# Patient Record
Sex: Male | Born: 1950 | Race: White | Hispanic: No | Marital: Married | Smoking: Never smoker
Health system: Southern US, Community
[De-identification: ages and names within clinical notes are randomized; demographics above are authoritative.]

## PROBLEM LIST (undated history)

## (undated) DIAGNOSIS — K219 Gastro-esophageal reflux disease without esophagitis: Secondary | ICD-10-CM

## (undated) DIAGNOSIS — Z9289 Personal history of other medical treatment: Secondary | ICD-10-CM

## (undated) DIAGNOSIS — M1712 Unilateral primary osteoarthritis, left knee: Secondary | ICD-10-CM

## (undated) DIAGNOSIS — E785 Hyperlipidemia, unspecified: Secondary | ICD-10-CM

## (undated) DIAGNOSIS — M5136 Other intervertebral disc degeneration, lumbar region: Secondary | ICD-10-CM

## (undated) DIAGNOSIS — S83207A Unspecified tear of unspecified meniscus, current injury, left knee, initial encounter: Secondary | ICD-10-CM

## (undated) DIAGNOSIS — Z8619 Personal history of other infectious and parasitic diseases: Secondary | ICD-10-CM

## (undated) DIAGNOSIS — E119 Type 2 diabetes mellitus without complications: Secondary | ICD-10-CM

## (undated) DIAGNOSIS — M51369 Other intervertebral disc degeneration, lumbar region without mention of lumbar back pain or lower extremity pain: Secondary | ICD-10-CM

## (undated) DIAGNOSIS — M75102 Unspecified rotator cuff tear or rupture of left shoulder, not specified as traumatic: Secondary | ICD-10-CM

## (undated) HISTORY — PX: THUMB AMPUTATION: SHX804

## (undated) HISTORY — PX: COLONOSCOPY: SHX174

## (undated) HISTORY — DX: Type 2 diabetes mellitus without complications: E11.9

## (undated) HISTORY — PX: SHOULDER ARTHROSCOPY: SHX128

## (undated) HISTORY — PX: SPINE SURGERY: SHX786

## (undated) HISTORY — PX: JOINT REPLACEMENT: SHX530

## (undated) HISTORY — DX: Unspecified rotator cuff tear or rupture of left shoulder, not specified as traumatic: M75.102

## (undated) HISTORY — PX: FOOT SURGERY: SHX648

## (undated) HISTORY — DX: Other intervertebral disc degeneration, lumbar region without mention of lumbar back pain or lower extremity pain: M51.369

## (undated) HISTORY — PX: LAMINOTOMY: SHX998

## (undated) HISTORY — DX: Hyperlipidemia, unspecified: E78.5

## (undated) HISTORY — PX: KNEE ARTHROSCOPY: SUR90

## (undated) HISTORY — DX: Other intervertebral disc degeneration, lumbar region: M51.36

## (undated) HISTORY — DX: Unspecified tear of unspecified meniscus, current injury, left knee, initial encounter: S83.207A

## (undated) HISTORY — PX: CERVICAL FUSION: SHX112

---

## 2002-03-16 ENCOUNTER — Encounter: Payer: Self-pay | Admitting: Family Medicine

## 2002-03-16 ENCOUNTER — Encounter: Admission: RE | Admit: 2002-03-16 | Discharge: 2002-03-16 | Payer: Self-pay | Admitting: Family Medicine

## 2004-08-03 ENCOUNTER — Encounter: Admission: RE | Admit: 2004-08-03 | Discharge: 2004-08-03 | Payer: Self-pay | Admitting: Family Medicine

## 2013-09-21 ENCOUNTER — Other Ambulatory Visit: Payer: BC Managed Care – PPO

## 2013-09-21 DIAGNOSIS — Z125 Encounter for screening for malignant neoplasm of prostate: Secondary | ICD-10-CM

## 2013-09-21 DIAGNOSIS — I10 Essential (primary) hypertension: Secondary | ICD-10-CM

## 2013-09-21 DIAGNOSIS — R739 Hyperglycemia, unspecified: Secondary | ICD-10-CM

## 2013-09-21 DIAGNOSIS — E785 Hyperlipidemia, unspecified: Secondary | ICD-10-CM

## 2013-09-21 DIAGNOSIS — Z0000 Encounter for general adult medical examination without abnormal findings: Secondary | ICD-10-CM

## 2013-09-21 DIAGNOSIS — Z79899 Other long term (current) drug therapy: Secondary | ICD-10-CM

## 2013-09-21 LAB — CBC WITH DIFFERENTIAL/PLATELET
Basophils Absolute: 0 10*3/uL (ref 0.0–0.1)
Basophils Relative: 0 % (ref 0–1)
EOS ABS: 0.2 10*3/uL (ref 0.0–0.7)
Eosinophils Relative: 3 % (ref 0–5)
HCT: 38.6 % — ABNORMAL LOW (ref 39.0–52.0)
Hemoglobin: 13 g/dL (ref 13.0–17.0)
LYMPHS ABS: 1.9 10*3/uL (ref 0.7–4.0)
LYMPHS PCT: 32 % (ref 12–46)
MCH: 30.4 pg (ref 26.0–34.0)
MCHC: 33.7 g/dL (ref 30.0–36.0)
MCV: 90.4 fL (ref 78.0–100.0)
Monocytes Absolute: 0.5 10*3/uL (ref 0.1–1.0)
Monocytes Relative: 9 % (ref 3–12)
NEUTROS ABS: 3.3 10*3/uL (ref 1.7–7.7)
NEUTROS PCT: 56 % (ref 43–77)
PLATELETS: 229 10*3/uL (ref 150–400)
RBC: 4.27 MIL/uL (ref 4.22–5.81)
RDW: 14.1 % (ref 11.5–15.5)
WBC: 5.9 10*3/uL (ref 4.0–10.5)

## 2013-09-21 LAB — LIPID PANEL
CHOL/HDL RATIO: 2.7 ratio
Cholesterol: 167 mg/dL (ref 0–200)
HDL: 62 mg/dL (ref >39)
LDL CALC: 93 mg/dL (ref 0–99)
Triglycerides: 59 mg/dL (ref <150)
VLDL: 12 mg/dL (ref 0–40)

## 2013-09-21 LAB — HEMOGLOBIN A1C
HEMOGLOBIN A1C: 6.4 % — AB (ref <5.7)
Mean Plasma Glucose: 137 mg/dL — ABNORMAL HIGH (ref <117)

## 2013-09-21 LAB — PSA: PSA: 0.61 ng/mL (ref <=4.00)

## 2013-09-21 LAB — TSH: TSH: 1.273 u[IU]/mL (ref 0.350–4.500)

## 2013-09-22 LAB — VITAMIN D 25 HYDROXY (VIT D DEFICIENCY, FRACTURES): Vit D, 25-Hydroxy: 42 ng/mL (ref 30–89)

## 2013-09-25 ENCOUNTER — Encounter: Payer: Self-pay | Admitting: Family Medicine

## 2013-09-25 ENCOUNTER — Ambulatory Visit (INDEPENDENT_AMBULATORY_CARE_PROVIDER_SITE_OTHER): Payer: BC Managed Care – PPO | Admitting: Family Medicine

## 2013-09-25 VITALS — BP 120/82 | HR 78 | Temp 97.0°F | Resp 18 | Ht 70.0 in | Wt 172.0 lb

## 2013-09-25 DIAGNOSIS — Z23 Encounter for immunization: Secondary | ICD-10-CM

## 2013-09-25 DIAGNOSIS — Z0000 Encounter for general adult medical examination without abnormal findings: Secondary | ICD-10-CM

## 2013-09-25 DIAGNOSIS — R7303 Prediabetes: Secondary | ICD-10-CM | POA: Insufficient documentation

## 2013-09-25 DIAGNOSIS — M5412 Radiculopathy, cervical region: Secondary | ICD-10-CM

## 2013-09-25 DIAGNOSIS — Z79899 Other long term (current) drug therapy: Secondary | ICD-10-CM

## 2013-09-25 DIAGNOSIS — M5010 Cervical disc disorder with radiculopathy, unspecified cervical region: Secondary | ICD-10-CM

## 2013-09-25 MED ORDER — ATORVASTATIN CALCIUM 20 MG PO TABS: 20.0000 mg | ORAL_TABLET | Freq: Every day | ORAL | Status: DC
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
## 2013-09-25 NOTE — Addendum Note (Signed)
Addended by: Shary Decamp B on: 09/25/2013 08:57 AM   Modules accepted: Orders

## 2013-09-28 ENCOUNTER — Other Ambulatory Visit: Payer: Self-pay | Admitting: Family Medicine

## 2013-09-28 ENCOUNTER — Ambulatory Visit
Admission: RE | Admit: 2013-09-28 | Discharge: 2013-09-28 | Disposition: A | Discharge status: Home / Self Care | Payer: BC Managed Care – PPO | Source: Ambulatory Visit | Attending: Family Medicine | Admitting: Family Medicine

## 2013-09-28 DIAGNOSIS — M5010 Cervical disc disorder with radiculopathy, unspecified cervical region: Secondary | ICD-10-CM

## 2013-09-28 DIAGNOSIS — G9520 Unspecified cord compression: Secondary | ICD-10-CM

## 2013-11-05 ENCOUNTER — Telehealth: Payer: Self-pay | Admitting: *Deleted

## 2013-11-05 MED ORDER — SCOPOLAMINE 1 MG/3DAYS TD PT72: 1.0000 | MEDICATED_PATCH | TRANSDERMAL | Status: DC

## 2013-11-05 NOTE — Telephone Encounter (Signed)
OK to fill

## 2013-11-05 NOTE — Telephone Encounter (Signed)
Med sent to pharm and pt aware per vm

## 2013-11-05 NOTE — Telephone Encounter (Signed)
Pt is going fishing in a week at the beach and needs patch for nausea will only be gone for a day.   Pharmacy [facility]  Call back number is [phone]

## 2013-11-05 NOTE — Telephone Encounter (Signed)
Scopolamine patch 1 patch q 72 hrs prn motion sickness.

## 2014-09-23 ENCOUNTER — Other Ambulatory Visit: Payer: Self-pay | Admitting: Family Medicine

## 2015-01-06 ENCOUNTER — Telehealth: Payer: Self-pay | Admitting: Family Medicine

## 2015-01-06 MED ORDER — ATORVASTATIN CALCIUM 20 MG PO TABS: 20.0000 mg | ORAL_TABLET | Freq: Every day | ORAL | Status: DC

## 2015-01-06 NOTE — Telephone Encounter (Signed)
Medication called/sent to requested pharmacy  

## 2015-01-06 NOTE — Telephone Encounter (Signed)
Patient is calling to say that he needs to switch pharmacies because his pharmacy is not going to be able to cover the lipitor anymore, needs a refill on this and to go to the :   [facility] if any questions for him call [phone]

## 2015-01-14 ENCOUNTER — Other Ambulatory Visit: Payer: Medicare Other

## 2015-01-14 DIAGNOSIS — Z79899 Other long term (current) drug therapy: Secondary | ICD-10-CM

## 2015-01-14 DIAGNOSIS — E785 Hyperlipidemia, unspecified: Secondary | ICD-10-CM

## 2015-01-14 DIAGNOSIS — R7303 Prediabetes: Secondary | ICD-10-CM

## 2015-01-14 DIAGNOSIS — Z0000 Encounter for general adult medical examination without abnormal findings: Secondary | ICD-10-CM

## 2015-01-14 LAB — HEMOGLOBIN A1C
Hgb A1c MFr Bld: 7 % — ABNORMAL HIGH (ref <5.7)
Mean Plasma Glucose: 154 mg/dL — ABNORMAL HIGH (ref <117)

## 2015-01-14 LAB — CBC WITH DIFFERENTIAL/PLATELET
Basophils Absolute: 0 10*3/uL (ref 0.0–0.1)
Basophils Relative: 0 % (ref 0–1)
Eosinophils Absolute: 0.2 10*3/uL (ref 0.0–0.7)
Eosinophils Relative: 2 % (ref 0–5)
HCT: 41.9 % (ref 39.0–52.0)
Hemoglobin: 14.1 g/dL (ref 13.0–17.0)
Lymphocytes Relative: 11 % — ABNORMAL LOW (ref 12–46)
Lymphs Abs: 1.3 10*3/uL (ref 0.7–4.0)
MCH: 31.3 pg (ref 26.0–34.0)
MCHC: 33.7 g/dL (ref 30.0–36.0)
MCV: 92.9 fL (ref 78.0–100.0)
MPV: 10.9 fL (ref 8.6–12.4)
Monocytes Absolute: 1.1 10*3/uL — ABNORMAL HIGH (ref 0.1–1.0)
Monocytes Relative: 10 % (ref 3–12)
NEUTROS ABS: 8.8 10*3/uL — AB (ref 1.7–7.7)
Neutrophils Relative %: 77 % (ref 43–77)
PLATELETS: 209 10*3/uL (ref 150–400)
RBC: 4.51 MIL/uL (ref 4.22–5.81)
RDW: 14.2 % (ref 11.5–15.5)
WBC: 11.4 10*3/uL — AB (ref 4.0–10.5)

## 2015-01-14 LAB — COMPLETE METABOLIC PANEL WITH GFR
ALT: 19 U/L (ref 0–53)
AST: 23 U/L (ref 0–37)
Albumin: 4.3 g/dL (ref 3.5–5.2)
Alkaline Phosphatase: 70 U/L (ref 39–117)
BILIRUBIN TOTAL: 0.4 mg/dL (ref 0.2–1.2)
BUN: 18 mg/dL (ref 6–23)
CO2: 27 meq/L (ref 19–32)
CREATININE: 0.94 mg/dL (ref 0.50–1.35)
Calcium: 9.8 mg/dL (ref 8.4–10.5)
Chloride: 102 mEq/L (ref 96–112)
GFR, Est Non African American: 85 mL/min
GLUCOSE: 105 mg/dL — AB (ref 70–99)
POTASSIUM: 4.2 meq/L (ref 3.5–5.3)
SODIUM: 140 meq/L (ref 135–145)
Total Protein: 6.7 g/dL (ref 6.0–8.3)

## 2015-01-14 LAB — LIPID PANEL
Cholesterol: 173 mg/dL (ref 0–200)
HDL: 63 mg/dL (ref >=40)
LDL Cholesterol: 92 mg/dL (ref 0–99)
Total CHOL/HDL Ratio: 2.7 Ratio
Triglycerides: 92 mg/dL (ref <150)
VLDL: 18 mg/dL (ref 0–40)

## 2015-01-14 LAB — TSH: TSH: 1.48 u[IU]/mL (ref 0.350–4.500)

## 2015-01-31 ENCOUNTER — Ambulatory Visit (INDEPENDENT_AMBULATORY_CARE_PROVIDER_SITE_OTHER): Payer: Medicare Other | Admitting: Family Medicine

## 2015-01-31 ENCOUNTER — Encounter: Payer: Self-pay | Admitting: Family Medicine

## 2015-01-31 VITALS — BP 136/76 | HR 72 | Temp 97.8°F | Resp 18 | Ht 70.0 in | Wt 177.0 lb

## 2015-01-31 DIAGNOSIS — Z0000 Encounter for general adult medical examination without abnormal findings: Secondary | ICD-10-CM

## 2015-01-31 DIAGNOSIS — Z125 Encounter for screening for malignant neoplasm of prostate: Secondary | ICD-10-CM | POA: Diagnosis not present

## 2015-01-31 DIAGNOSIS — Z23 Encounter for immunization: Secondary | ICD-10-CM | POA: Diagnosis not present

## 2015-01-31 NOTE — Addendum Note (Signed)
Addended by: Shary Decamp B on: 01/31/2015 08:35 AM   Modules accepted: Orders

## 2015-01-31 NOTE — Addendum Note (Signed)
Addended by: Shary Decamp B on: 01/31/2015 09:18 AM   Modules accepted: Orders

## 2015-01-31 NOTE — Progress Notes (Signed)
Subjective:    Patient ID: Todd Lawrence, male    DOB: [birth year], 64 y.o.   MRN: [id number]  HPI  Patient is a 64 yo Wm here for CPE.  He has no specific concerns.  Colonoscopy was performed less than 10 years ago.  Patient is due for DRE and PSA.  He is also due for pneumovax 23.  Has had tetanus and zostavax.  Most recent lab work is below: Lab on 01/14/2015  Component Date Value Ref Range Status  . Sodium 01/14/2015 140  135 - 145 mEq/L Final  . Potassium 01/14/2015 4.2  3.5 - 5.3 mEq/L Final  . Chloride 01/14/2015 102  96 - 112 mEq/L Final  . CO2 01/14/2015 27  19 - 32 mEq/L Final  . Glucose, Bld 01/14/2015 105* 70 - 99 mg/dL Final  . BUN 01/14/2015 18  6 - 23 mg/dL Final  . Creat 01/14/2015 0.94  0.50 - 1.35 mg/dL Final  . Total Bilirubin 01/14/2015 0.4  0.2 - 1.2 mg/dL Final  . Alkaline Phosphatase 01/14/2015 70  39 - 117 U/L Final  . AST 01/14/2015 23  0 - 37 U/L Final  . ALT 01/14/2015 19  0 - 53 U/L Final  . Total Protein 01/14/2015 6.7  6.0 - 8.3 g/dL Final  . Albumin 01/14/2015 4.3  3.5 - 5.2 g/dL Final  . Calcium 01/14/2015 9.8  8.4 - 10.5 mg/dL Final  . GFR, Est African American 01/14/2015 >89   Final  . GFR, Est Non African American 01/14/2015 85   Final   Comment:   The estimated GFR is a calculation valid for adults (>=41 years old) that uses the CKD-EPI algorithm to adjust for age and sex. It is   not to be used for children, pregnant women, hospitalized patients,    patients on dialysis, or with rapidly changing kidney function. According to the NKDEP, eGFR >89 is normal, 60-89 shows mild impairment, 30-59 shows moderate impairment, 15-29 shows severe impairment and <15 is ESRD.     . TSH 01/14/2015 1.480  0.350 - 4.500 uIU/mL Final  . Cholesterol 01/14/2015 173  0 - 200 mg/dL Final   Comment: ATP III Classification:       < 200        mg/dL        Desirable      200 - 239     mg/dL        Borderline High      >= 240        mg/dL        High     .  Triglycerides 01/14/2015 92  <150 mg/dL Final  . HDL 01/14/2015 63  >=40 mg/dL Final  . Total CHOL/HDL Ratio 01/14/2015 2.7   Final  . VLDL 01/14/2015 18  0 - 40 mg/dL Final  . LDL Cholesterol 01/14/2015 92  0 - 99 mg/dL Final   Comment:   Total Cholesterol/HDL Ratio:CHD Risk                        Coronary Heart Disease Risk Table                                        Men       Women          1/2 Average Risk  3.4        3.3              Average Risk              5.0        4.4           2X Average Risk              9.6        7.1           3X Average Risk             23.4       11.0 Use the calculated Patient Ratio above and the CHD Risk table  to determine the patient's CHD Risk. ATP III Classification (LDL):       < 100        mg/dL         Optimal      100 - 129     mg/dL         Near or Above Optimal      130 - 159     mg/dL         Borderline High      160 - 189     mg/dL         High       > 190        mg/dL         Very High     . WBC 01/14/2015 11.4* 4.0 - 10.5 K/uL Final  . RBC 01/14/2015 4.51  4.22 - 5.81 MIL/uL Final  . Hemoglobin 01/14/2015 14.1  13.0 - 17.0 g/dL Final  . HCT 01/14/2015 41.9  39.0 - 52.0 % Final  . MCV 01/14/2015 92.9  78.0 - 100.0 fL Final  . MCH 01/14/2015 31.3  26.0 - 34.0 pg Final  . MCHC 01/14/2015 33.7  30.0 - 36.0 g/dL Final  . RDW 01/14/2015 14.2  11.5 - 15.5 % Final  . Platelets 01/14/2015 209  150 - 400 K/uL Final  . MPV 01/14/2015 10.9  8.6 - 12.4 fL Final  . Neutrophils Relative % 01/14/2015 77  43 - 77 % Final  . Neutro Abs 01/14/2015 8.8* 1.7 - 7.7 K/uL Final  . Lymphocytes Relative 01/14/2015 11* 12 - 46 % Final  . Lymphs Abs 01/14/2015 1.3  0.7 - 4.0 K/uL Final  . Monocytes Relative 01/14/2015 10  3 - 12 % Final  . Monocytes Absolute 01/14/2015 1.1* 0.1 - 1.0 K/uL Final  . Eosinophils Relative 01/14/2015 2  0 - 5 % Final  . Eosinophils Absolute 01/14/2015 0.2  0.0 - 0.7 K/uL Final  . Basophils Relative 01/14/2015 0   0 - 1 % Final  . Basophils Absolute 01/14/2015 0.0  0.0 - 0.1 K/uL Final  . Smear Review 01/14/2015 Criteria for review not met   Final  . Hgb A1c MFr Bld 01/14/2015 7.0* <5.7 % Final   Comment:                                                                        According to the ADA Clinical Practice Recommendations for 2011, when HbA1c is used as  a screening test:     >=6.5%   Diagnostic of Diabetes Mellitus            (if abnormal result is confirmed)   5.7-6.4%   Increased risk of developing Diabetes Mellitus   References:Diagnosis and Classification of Diabetes Mellitus,Diabetes OLMB,8675,44(BEEFE 1):S62-S69 and Standards of Medical Care in         Diabetes - 2011,Diabetes OFHQ,1975,88 (Suppl 1):S11-S61.     . Mean Plasma Glucose 01/14/2015 154* <117 mg/dL Final   Past Medical History  Diagnosis Date  . Hyperlipidemia   . Prediabetes    No past surgical history on file. Current Outpatient Prescriptions on File Prior to Visit  Medication Sig Dispense Refill  . aspirin 81 MG tablet Take 81 mg by mouth daily.    Marland Kitchen atorvastatin (LIPITOR) 20 MG tablet Take 1 tablet (20 mg total) by mouth daily. 90 tablet 1   No current facility-administered medications on file prior to visit.   No Known Allergies History   Social History  . Marital Status: Married    Spouse Name: N/A  . Number of Children: N/A  . Years of Education: N/A   Occupational History  . Not on file.   Social History Main Topics  . Smoking status: Never Smoker   . Smokeless tobacco: Never Used  . Alcohol Use: No  . Drug Use: No  . 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 Not on file   Social History Narrative   Family History  Problem Relation Age of Onset  . Heart disease Father   . Asthma Sister   . Arthritis Sister      Review of Systems  All other systems reviewed and are negative.      Objective:   Physical  Exam  Constitutional: He is oriented to person, place, and time. He appears well-developed and well-nourished. No distress.  HENT:  Head: Normocephalic and atraumatic.  Right Ear: External ear normal.  Left Ear: External ear normal.  Nose: Nose normal.  Mouth/Throat: Oropharynx is clear and moist. No oropharyngeal exudate.  Eyes: Conjunctivae and EOM are normal. Pupils are equal, round, and reactive to light. Right eye exhibits no discharge. Left eye exhibits no discharge. No scleral icterus.  Neck: Normal range of motion. Neck supple. No JVD present. No thyromegaly present.  Cardiovascular: Normal rate, regular rhythm, normal heart sounds and intact distal pulses.  Exam reveals no gallop and no friction rub.   No murmur heard. Pulmonary/Chest: Effort normal and breath sounds normal. No respiratory distress. He has no wheezes. He has no rales. He exhibits no tenderness.  Abdominal: Soft. Bowel sounds are normal. He exhibits no distension and no mass. There is no tenderness. There is no rebound and no guarding.  Genitourinary: Rectum normal, prostate normal and penis normal.  Musculoskeletal: Normal range of motion. He exhibits no edema or tenderness.  Lymphadenopathy:    He has no cervical adenopathy.  Neurological: He is alert and oriented to person, place, and time. He has normal reflexes. He displays normal reflexes. No cranial nerve deficit. He exhibits normal muscle tone. Coordination normal.  Skin: Skin is warm. No rash noted. He is not diaphoretic. No erythema. No pallor.  Psychiatric: He has a normal mood and affect. His behavior is normal. Judgment and thought content normal.  Vitals reviewed.         Assessment & Plan:  Routine  general medical examination at a health care facility  Patient's exam was normal.  Had a long discussion regarding his diabetes.  He would like to try aggressive lifestyle changes and recheck his sugar in 6 months rather than metformin.  Received  pneumovax today.  Will check PSA.  Remainder of preventative care is up to date.  Regular anticipatory guidance is provided. I recommended less than 45 g of carbs per meal. Patient has been relatively inactive due to recent neck surgery and knee surgery. He believes this is causing his sugars to rise. 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 Patient recently had an eye exam. Diabetic foot exam is normal

## 2015-02-01 LAB — PSA: PSA: 0.63 ng/mL (ref <=4.00)

## 2015-07-04 ENCOUNTER — Other Ambulatory Visit: Payer: Self-pay | Admitting: Family Medicine

## 2015-08-09 ENCOUNTER — Emergency Department (HOSPITAL_COMMUNITY): Payer: Medicare Other

## 2015-08-09 ENCOUNTER — Encounter (HOSPITAL_COMMUNITY): Payer: Self-pay | Admitting: Emergency Medicine

## 2015-08-09 ENCOUNTER — Emergency Department (HOSPITAL_COMMUNITY)
Admission: EM | Admit: 2015-08-09 | Discharge: 2015-08-09 | Disposition: A | Discharge status: Other Acute Inpatient Hospital | Payer: Medicare Other | Attending: Emergency Medicine | Admitting: Emergency Medicine

## 2015-08-09 DIAGNOSIS — Y9289 Other specified places as the place of occurrence of the external cause: Secondary | ICD-10-CM | POA: Insufficient documentation

## 2015-08-09 DIAGNOSIS — S68123A Partial traumatic metacarpophalangeal amputation of left middle finger, initial encounter: Secondary | ICD-10-CM | POA: Insufficient documentation

## 2015-08-09 DIAGNOSIS — Y9389 Activity, other specified: Secondary | ICD-10-CM | POA: Insufficient documentation

## 2015-08-09 DIAGNOSIS — S61412A Laceration without foreign body of left hand, initial encounter: Secondary | ICD-10-CM | POA: Diagnosis present

## 2015-08-09 DIAGNOSIS — S68012A Complete traumatic metacarpophalangeal amputation of left thumb, initial encounter: Secondary | ICD-10-CM | POA: Insufficient documentation

## 2015-08-09 DIAGNOSIS — W312XXA Contact with powered woodworking and forming machines, initial encounter: Secondary | ICD-10-CM | POA: Insufficient documentation

## 2015-08-09 DIAGNOSIS — Z79899 Other long term (current) drug therapy: Secondary | ICD-10-CM | POA: Insufficient documentation

## 2015-08-09 DIAGNOSIS — Z7982 Long term (current) use of aspirin: Secondary | ICD-10-CM | POA: Insufficient documentation

## 2015-08-09 DIAGNOSIS — IMO0002 Reserved for concepts with insufficient information to code with codable children: Secondary | ICD-10-CM

## 2015-08-09 DIAGNOSIS — E785 Hyperlipidemia, unspecified: Secondary | ICD-10-CM | POA: Insufficient documentation

## 2015-08-09 DIAGNOSIS — Z23 Encounter for immunization: Secondary | ICD-10-CM | POA: Insufficient documentation

## 2015-08-09 DIAGNOSIS — Y998 Other external cause status: Secondary | ICD-10-CM | POA: Insufficient documentation

## 2015-08-09 DIAGNOSIS — S68121A Partial traumatic metacarpophalangeal amputation of left index finger, initial encounter: Secondary | ICD-10-CM | POA: Diagnosis not present

## 2015-08-09 DIAGNOSIS — E119 Type 2 diabetes mellitus without complications: Secondary | ICD-10-CM | POA: Diagnosis not present

## 2015-08-09 LAB — CBC
HCT: 37.1 % — ABNORMAL LOW (ref 39.0–52.0)
HEMOGLOBIN: 12.2 g/dL — AB (ref 13.0–17.0)
MCH: 30.7 pg (ref 26.0–34.0)
MCHC: 32.9 g/dL (ref 30.0–36.0)
MCV: 93.5 fL (ref 78.0–100.0)
Platelets: 204 10*3/uL (ref 150–400)
RBC: 3.97 MIL/uL — ABNORMAL LOW (ref 4.22–5.81)
RDW: 12.8 % (ref 11.5–15.5)
WBC: 7.3 10*3/uL (ref 4.0–10.5)

## 2015-08-09 LAB — BASIC METABOLIC PANEL
Anion gap: 9 (ref 5–15)
BUN: 18 mg/dL (ref 6–20)
CHLORIDE: 101 mmol/L (ref 101–111)
CO2: 25 mmol/L (ref 22–32)
CREATININE: 0.89 mg/dL (ref 0.61–1.24)
Calcium: 9 mg/dL (ref 8.9–10.3)
GFR calc Af Amer: >60 mL/min (ref >60)
GFR calc non Af Amer: >60 mL/min (ref >60)
GLUCOSE: 153 mg/dL — AB (ref 65–99)
Potassium: 4.3 mmol/L (ref 3.5–5.1)
SODIUM: 135 mmol/L (ref 135–145)

## 2015-08-09 MED ORDER — TETANUS-DIPHTH-ACELL PERTUSSIS 5-2.5-18.5 LF-MCG/0.5 IM SUSP
0.5000 mL | Freq: Once | INTRAMUSCULAR | Status: AC
  Administered 2015-08-09: 0.5 mL via INTRAMUSCULAR
  Filled 2015-08-09: qty 0.5

## 2015-08-09 MED ORDER — HYDROMORPHONE HCL 1 MG/ML IJ SOLN
1.0000 mg | Freq: Once | INTRAMUSCULAR | Status: AC
  Administered 2015-08-09: 1 mg via INTRAVENOUS
  Filled 2015-08-09: qty 1

## 2015-08-09 MED ORDER — MORPHINE SULFATE (PF) 4 MG/ML IV SOLN
6.0000 mg | Freq: Once | INTRAVENOUS | Status: AC
  Administered 2015-08-09: 6 mg via INTRAVENOUS
  Filled 2015-08-09: qty 2

## 2015-08-09 MED ORDER — SODIUM CHLORIDE 0.9 % IV BOLUS (SEPSIS)
1000.0000 mL | Freq: Once | INTRAVENOUS | Status: AC
  Administered 2015-08-09: 1000 mL via INTRAVENOUS

## 2015-08-09 MED ORDER — CEFAZOLIN SODIUM-DEXTROSE 2-3 GM-% IV SOLR
2.0000 g | Freq: Once | INTRAVENOUS | Status: AC
  Administered 2015-08-09: 2 g via INTRAVENOUS
  Filled 2015-08-09: qty 50

## 2015-08-09 NOTE — ED Notes (Signed)
Pt here with a lac to the left hand from a table saw pt states that it got the thumb pointer and middle finger , bleeding is controlled

## 2015-08-09 NOTE — ED Notes (Signed)
Patient transported to X-ray 

## 2015-08-09 NOTE — ED Notes (Signed)
Patient returned from X-ray 

## 2015-08-09 NOTE — ED Notes (Signed)
Notified [facility] for transfer to [facility]

## 2015-08-09 NOTE — Progress Notes (Signed)
Orthopedic Tech Progress Note Patient Details:  Todd Lawrence 1951-04-26 WI:[id number]  Ortho Devices Type of Ortho Device: Ace wrap, Volar splint Ortho Device/Splint Interventions: Application   [name] 08/09/2015, 6:10 PM
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
## 2015-08-09 NOTE — ED Provider Notes (Signed)
CSN: [id number]     Arrival date & time 08/09/15  1545 History   First MD Initiated Contact with Patient 08/09/15 1557     Chief Complaint  Patient presents with  . Extremity Laceration     (Consider location/radiation/quality/duration/timing/severity/associated sxs/prior Treatment) HPI Comments: 65 year old male with history of diabetes on aspirin presents for significant hand laceration and amputation of the left hand from a table saw prior to arrival. Primarily affected thumb and index finger. Patient is right-handed. No other injuries. Unsure tetanus. Pain with any range of motion patient received pain meds prior to arrival by EMS.  The history is provided by the patient.    Past Medical History  Diagnosis Date  . Hyperlipidemia   . Diabetes mellitus without complication [facility])    Past Surgical History  Procedure Laterality Date  . Foot surgery    . Cervical fusion    . Knee arthroscopy     Family History  Problem Relation Age of Onset  . Heart disease Father   . Asthma Sister   . Arthritis Sister    Social History  Substance Use Topics  . Smoking status: Never Smoker   . Smokeless tobacco: Never Used  . Alcohol Use: No    Review of Systems  Constitutional: Negative for fever and chills.  HENT: Negative for congestion.   Eyes: Negative for visual disturbance.  Respiratory: Negative for shortness of breath.   Cardiovascular: Negative for chest pain.  Gastrointestinal: Negative for vomiting and abdominal pain.  Genitourinary: Negative for dysuria and flank pain.  Musculoskeletal: Positive for joint swelling. Negative for back pain, neck pain and neck stiffness.  Skin: Positive for wound. Negative for rash.  Neurological: Negative for light-headedness and headaches.      Allergies  Review of patient's allergies indicates no known allergies.  Home Medications   Prior to Admission medications   Medication Sig Start Date End Date Taking? Authorizing Provider   aspirin 81 MG tablet Take 81 mg by mouth daily.   Yes Historical Provider, MD  atorvastatin (LIPITOR) 20 MG tablet TAKE 1 TABLET(20 MG) BY MOUTH DAILY 07/04/15  Yes [name], MD  Misc Natural Products (GLUCOSAMINE CHOND COMPLEX/MSM PO) Take 1 tablet by mouth daily.    Yes Historical Provider, MD  Multiple Vitamin (MULTIVITAMIN WITH MINERALS) TABS tablet Take 1 tablet by mouth daily.   Yes Historical Provider, MD   BP 145/93 mmHg  Pulse 96  Temp(Src) 97.8 F (36.6 C) (Oral)  Resp 14  SpO2 98% Physical Exam  Constitutional: He is oriented to person, place, and time. He appears well-developed and well-nourished.  HENT:  Head: Normocephalic and atraumatic.  Eyes: Conjunctivae are normal. Right eye exhibits no discharge. Left eye exhibits no discharge.  Neck: Normal range of motion. Neck supple. No tracheal deviation present.  Cardiovascular: Normal rate and regular rhythm.   Pulmonary/Chest: Effort normal and breath sounds normal.  Abdominal: Soft. He exhibits no distension. There is no tenderness. There is no guarding.  Musculoskeletal: He exhibits edema and tenderness.  Patient has complete amputation of left thumb hanging on with small areas skin, mild bleeding, bone exposed, patient has partial amputation of index finger and middle finger on the left hand with decreased flexion and extension, cool distal fingertips and the thumb pointer and middle finger. Patient has flexion extension grossly of the small finger and ring finger on the left side. Difficulty obtaining details due to pain and bleeding at this time. Decreased sensation distal to palpation. No  tenderness to wrist or proximal forearm.  Neurological: He is alert and oriented to person, place, and time.  Skin: Skin is warm. No rash noted.  Psychiatric: He has a normal mood and affect.  Nursing note and vitals reviewed.   ED Course  Procedures (including critical care time) Labs Review Labs Reviewed  CBC - Abnormal;  Notable for the following:    RBC 3.97 (*)    Hemoglobin 12.2 (*)    HCT 37.1 (*)    All other components within normal limits  BASIC METABOLIC PANEL - Abnormal; Notable for the following:    Glucose, Bld 153 (*)    All other components within normal limits    Imaging Review No results found. I have personally reviewed and evaluated these images and lab results as part of my medical decision-making.   EKG Interpretation None      MDM   Final diagnoses:  Thumb amputation status, left  Hand laceration, left, initial encounter   Patient presents with significant left hand laceration with unfortunate amputation of thumb and partial amputation of index and middle finger. Laceration extends all the way across palmar aspect of the left hand. Hand surgeon consult immediately. Nothing by mouth, pain meds fluids blood work. Plan for operating room. Hand surgery recommended transfer to [facility], Copy discussed this case with orthopedic hand on call at [facility] area patient transferred to ER. The patients results and plan were reviewed and discussed.   Any x-rays performed were independently reviewed by myself.   Differential diagnosis were considered with the presenting HPI.  Medications  morphine 4 MG/ML injection 6 mg (6 mg Intravenous Given 08/09/15 1631)  sodium chloride 0.9 % bolus 1,000 mL (0 mLs Intravenous Stopped 08/09/15 1734)  ceFAZolin (ANCEF) IVPB 2 g/50 mL premix (0 g Intravenous Stopped 08/09/15 1740)  Tdap (BOOSTRIX) injection 0.5 mL (0.5 mLs Intramuscular Given 08/09/15 1635)  HYDROmorphone (DILAUDID) injection 1 mg (1 mg Intravenous Given 08/09/15 1728)    Filed Vitals:   08/09/15 1830 08/09/15 1900 08/09/15 1930 08/09/15 1936  BP: 144/96 135/86 148/93 145/93  Pulse: 94 92 98 96  Temp:    97.8 F (36.6 C)  TempSrc:      Resp: 13 13 20 14   SpO2: 96% 96% 95% 98%    Final diagnoses:  Thumb amputation status, left  Hand laceration, left, initial encounter     Admission/ observation were discussed with the admitting physician, patient and/or family and they are comfortable with the plan.      Elnora Morrison, MD 08/16/15 [phone]

## 2015-08-21 DIAGNOSIS — S61217A Laceration without foreign body of left little finger without damage to nail, initial encounter: Secondary | ICD-10-CM | POA: Insufficient documentation

## 2015-08-21 DIAGNOSIS — S68119A Complete traumatic metacarpophalangeal amputation of unspecified finger, initial encounter: Secondary | ICD-10-CM | POA: Insufficient documentation

## 2015-09-28 DIAGNOSIS — M86642 Other chronic osteomyelitis, left hand: Secondary | ICD-10-CM | POA: Insufficient documentation

## 2015-10-04 ENCOUNTER — Other Ambulatory Visit: Payer: Self-pay | Admitting: Family Medicine

## 2015-10-23 ENCOUNTER — Encounter: Payer: Self-pay | Admitting: *Deleted

## 2015-10-28 ENCOUNTER — Encounter: Payer: Self-pay | Admitting: Occupational Therapy

## 2015-10-28 ENCOUNTER — Ambulatory Visit: Payer: Medicare Other | Attending: Plastic Surgery | Admitting: Occupational Therapy

## 2015-10-28 DIAGNOSIS — R600 Localized edema: Secondary | ICD-10-CM | POA: Insufficient documentation

## 2015-10-28 DIAGNOSIS — M25642 Stiffness of left hand, not elsewhere classified: Secondary | ICD-10-CM | POA: Diagnosis present

## 2015-10-28 DIAGNOSIS — R29898 Other symptoms and signs involving the musculoskeletal system: Secondary | ICD-10-CM | POA: Diagnosis present

## 2015-10-28 DIAGNOSIS — M25542 Pain in joints of left hand: Secondary | ICD-10-CM | POA: Diagnosis not present

## 2015-10-28 DIAGNOSIS — R208 Other disturbances of skin sensation: Secondary | ICD-10-CM | POA: Diagnosis present

## 2015-10-28 NOTE — Therapy (Signed)
[facility] [address] [place], [place], [address] Phone: [phone]   Fax:  [phone]  Occupational Therapy Evaluation  Patient Details  Name: Todd Lawrence MRN: [id number] Date of Birth: Oct 10, 1950 Referring Provider: Dr. Tressa Busman  Encounter Date: 10/28/2015      OT End of Session - 10/28/15 0920    Visit Number 1   Number of Visits 17   Date for OT Re-Evaluation 12/27/15   Authorization Type [employer]    OT Start Time 0800   OT Stop Time 0900   OT Time Calculation (min) 60 min   Activity Tolerance Patient tolerated treatment well      Past Medical History  Diagnosis Date  . Hyperlipidemia   . Diabetes mellitus without complication [facility])     Past Surgical History  Procedure Laterality Date  . Foot surgery    . Cervical fusion    . Knee arthroscopy      There were no vitals filed for this visit.      Subjective Assessment - 10/28/15 0804    Subjective  The PA just told us to reschedule if needed   Patient is accompained by: Family member  wife   Limitations picc line RUE for antibiotics - no lifting greater than 10 lbs (due to picc line)   Patient Stated Goals Get as much use with Lt hand as possible, pick up my grandson   Currently in Pain? No/denies           [facility] OT Assessment - 10/28/15 0001    Assessment   Diagnosis amputation of Lt thumb   Referring Provider Dr. Tressa Busman   Onset Date 09/28/15  original injury 08/09/15   Assessment Pt with significantly limited ROM in all digits Lt hand, especially at index and long finger. Pt with original injury on 08/09/15 from table saw. Attempted to reattach thumb until amputation on 09/10/15. Removal of pins from fractures on 09/22/15. Followed by staph infection with I & D on 3/19. Pt still on PICC line for infection   Prior Therapy O.T. evaluation on 10/20/15 at [facility] - transferred care here    Precautions   Precaution Comments PICC line RUE - no lifting > 10 lbs   Balance Screen   Has the patient fallen in the past 6 months No   Has the patient had a decrease in activity level because of a fear of falling?  No   Is the patient reluctant to leave their home because of a fear of falling?  No   Home  Environment   Writer   Additional Comments Pt lives in 1 story home with 3-4 steps to enter   Lives With Spouse   Prior Function   Level of [place] Retired   Leisure raised Geographical information systems officer, softball, Psychologist, counselling, fishing   ADL   Eating/Feeding Needs assist with cutting food   Grooming Independent   Upper Body Bathing Minimal assistance   Lower Body Bathing Modified independent   Upper Body Dressing Minimal assistance  for buttons   Lower Body Dressing Minimal assistance  for tying shoes   Engineering geologist - Clothing Manipulation Independent   Toileting -  Hygiene Independent   IADL   Prior Level of Function Shopping wife always performed   Prior Level of Function Light Housekeeping wife performs most tasks   Light Housekeeping --  Pt has  done vacuuming   Prior Level of Function Meal Prep wife performs all cooking except grill cooking   Meal Prep --  Pt has returned to grill cooking   Programmer, applications own vehicle   Medication Management Is responsible for taking medication in correct dosages at correct time   Prior Level of Function Financial Management wife always performed   Mobility   Mobility Status Independent   Written Expression   Dominant Hand Right   Observation/Other Assessments   Observations Pt with severe swelling and shiny appearance of skin for index and long finger, minimal swelling ring and small finger   Skin Integrity impaired - shiny skin, limited mobility   Sensation   Light Touch Impaired Detail   Light Touch Impaired Details Impaired LUE  abscent fingertips Lt index and  long finger   Additional Comments hypersensitivity at amputation site   Coordination   Gross Motor Movements are Fluid and Coordinated No   Fine Motor Movements are Fluid and Coordinated No   Box and Blocks Lt = 33 (b/t ring and small finger)   Coordination Pt can only pick up pen b/t fingers of small and ring finger or ring and long finger - unable to manipulate. No functional use Lt hand for coordination. (intact Rt dominant hand)   Edema   Edema moderate to severe Lt hand at index and long fingers, minimal at ring and small fingers   ROM / Strength   AROM / PROM / Strength AROM   AROM   Overall AROM Comments BUE AROM WNL's except Lt hand (see below measurements). P/ROM TBA next session d/t time constraints   Left Hand AROM   L Thumb MCP 0-60 --  amputated at MP joint, only CMC motion   L Index  MCP 0-90 30 Degrees  -15* extension   L Index PIP 0-100 --  stuck in 40* flexion (no active motion)   L Long  MCP 0-90 15 Degrees   L Long PIP 0-100 --  stuck in 55* flexion (no active motion)   L Ring  MCP 0-90 55 Degrees  normal extension   L Ring PIP 0-100 45 Degrees  -15   L Little  MCP 0-90 60 Degrees  0 extension   L Little PIP 0-100 40 Degrees  -5   Hand Function   Right Hand Grip (lbs) 91 lbs   Left Hand Grip (lbs) 0 lbs                         OT Education - 10/28/15 0917    Education provided Yes   Education Details Further clarification/instruction on coban wrapping for edema management   Person(s) Educated Patient;Spouse   Methods Explanation;Handout   Comprehension Verbalized understanding          OT Short Term Goals - 10/28/15 0930    OT SHORT TERM GOAL #1   Title Independent with updated HEP PRN (DUE 11/27/15)   Time 4   Period Weeks   Status New   OT SHORT TERM GOAL #2   Title Independent with edema reduction strategies   Time 4   Period Weeks   Status New   OT SHORT TERM GOAL #3   Title Pt to verbalize understanding with potential  A/E needs and how to acquire for one handed use to increase independence with ADLS (including for cutting food and tying shoes)    Time 4   Period Weeks  Status New   OT SHORT TERM GOAL #4   Title Pt to demo improved ROM in all remaining digits of Lt hand at MP's and PIP's by 10 degrees or greater   Time 4   Period Weeks   Status New           OT Long Term Goals - 10/28/15 1047    OT LONG TERM GOAL #1   Title Pt to be mod I for all ADLS with compensatory strategies and A/E prn (all LTG's due 12/27/15)   Time 8   Period Weeks   Status New   OT LONG TERM GOAL #2   Title Pt to verbalize understanding with pain reduction strategies   Time 8   Period Weeks   Status New   OT LONG TERM GOAL #3   Title Pt to increase ROM in all remaining digits Lt hand at MP and PIP joints by 20 degrees from initial evaluation for increased functional use/gripping   Time 8   Period Weeks   Status New   OT LONG TERM GOAL #4   Title Pt to demo 10 lbs or greater grip strength Lt hand to assist with opening jars/containers   Time 8   Period Weeks   Status New               Plan - 10/28/15 0926    Clinical Impression Statement Pt is a 65 y.o. male who presents to outpatient rehab for moderately complex O.T. evaluation s/p table saw injury on 08/09/15 with thumb amputation at [facility] joint on 09/10/15. Pt also had fractures of index and long finger with pin removal on [id number], complications of staph infection beginning 09/28/15 with I & D. Pt remains on PICC line for infection. All injuries healed according to last office note at [facility], however pt with significant stiffness at all fingers Lt hand especially index and long finger.    Rehab Potential Fair   Clinical Impairments Affecting Rehab Potential severity of deficits   OT Frequency 2x / week   OT Duration 8 weeks  plus eval   OT Treatment/Interventions Self-care/ADL training;Therapeutic exercise;Patient/family education;Manual  Therapy;Splinting;Therapeutic exercises;Parrafin;DME and/or AE instruction;Compression bandaging;Therapeutic activities;Electrical Stimulation;Fluidtherapy;Scar mobilization;Moist Heat;Contrast Bath;Passive range of motion   Plan A/ROM and P/ROM to all digits Lt hand as tolerated, update HEP prn (from O.T. evaluation at [facility])   Consulted and Agree with Plan of Care Patient;Family member/caregiver   Family Member Consulted wife      Patient will benefit from skilled therapeutic intervention in order to improve the following deficits and impairments:  Decreased coordination, Impaired flexibility, Increased edema, Impaired sensation, Decreased skin integrity, Impaired UE functional use, Pain, Decreased strength  Visit Diagnosis: Pain in joints of left hand - Plan: Ot plan of care cert/re-cert  Stiffness of left hand, not elsewhere classified - Plan: Ot plan of care cert/re-cert  Other symptoms and signs involving the musculoskeletal system - Plan: Ot plan of care cert/re-cert  Other disturbances of skin sensation - Plan: Ot plan of care cert/re-cert  Localized edema - Plan: Ot plan of care cert/re-cert      G-Codes - [id number]    Functional Assessment Tool Used Quick Dash score: 50 (LUE)   Functional Limitation Carrying, moving and handling objects   Carrying, Moving and Handling Objects Current Status [id number]) At least 60 percent but less than 80 percent impaired, limited or restricted   Carrying, Moving and Handling Objects Goal Status [id number]) At least 20 percent  but less than 40 percent impaired, limited or restricted      Problem List Patient Active Problem List   Diagnosis Date Noted  . Prediabetes     Carey Bullocks, OTR/L 10/28/2015, 12:47 PM  [facility] [address] [place], [place], [address] Phone: [phone]   Fax:  [phone]  Name: Todd Lawrence MRN: [id number] Date of Birth:  Apr 27, 1951

## 2015-10-30 ENCOUNTER — Other Ambulatory Visit: Payer: Medicare Other

## 2015-10-30 DIAGNOSIS — Z79899 Other long term (current) drug therapy: Secondary | ICD-10-CM

## 2015-10-30 DIAGNOSIS — R7303 Prediabetes: Secondary | ICD-10-CM

## 2015-10-30 LAB — COMPLETE METABOLIC PANEL WITH GFR
ALT: 5 U/L — AB (ref 9–46)
AST: 23 U/L (ref 10–35)
Albumin: 4 g/dL (ref 3.6–5.1)
Alkaline Phosphatase: 72 U/L (ref 40–115)
BILIRUBIN TOTAL: 0.3 mg/dL (ref 0.2–1.2)
BUN: 14 mg/dL (ref 7–25)
CHLORIDE: 101 mmol/L (ref 98–110)
CO2: 25 mmol/L (ref 20–31)
CREATININE: 0.75 mg/dL (ref 0.70–1.25)
Calcium: 9.5 mg/dL (ref 8.6–10.3)
GFR, Est Non African American: >89 mL/min (ref >=60)
GLUCOSE: 120 mg/dL — AB (ref 70–99)
Potassium: 4.3 mmol/L (ref 3.5–5.3)
SODIUM: 137 mmol/L (ref 135–146)
TOTAL PROTEIN: 6.8 g/dL (ref 6.1–8.1)

## 2015-10-30 LAB — CBC WITH DIFFERENTIAL/PLATELET
BASOS ABS: 51 {cells}/uL (ref 0–200)
Basophils Relative: 1 %
EOS PCT: 4 %
Eosinophils Absolute: 204 cells/uL (ref 15–500)
HCT: 38.7 % (ref 38.5–50.0)
Hemoglobin: 12.5 g/dL — ABNORMAL LOW (ref 13.0–17.0)
Lymphocytes Relative: 25 %
Lymphs Abs: 1275 cells/uL (ref 850–3900)
MCH: 28.8 pg (ref 27.0–33.0)
MCHC: 32.3 g/dL (ref 32.0–36.0)
MCV: 89.2 fL (ref 80.0–100.0)
MONOS PCT: 9 %
MPV: 10.5 fL (ref 7.5–12.5)
Monocytes Absolute: 459 cells/uL (ref 200–950)
NEUTROS ABS: 3111 {cells}/uL (ref 1500–7800)
NEUTROS PCT: 61 %
PLATELETS: 242 10*3/uL (ref 140–400)
RBC: 4.34 MIL/uL (ref 4.20–5.80)
RDW: 14.3 % (ref 11.0–15.0)
WBC: 5.1 10*3/uL (ref 3.8–10.8)

## 2015-10-30 LAB — HEMOGLOBIN A1C
HEMOGLOBIN A1C: 6.5 % — AB (ref <5.7)
MEAN PLASMA GLUCOSE: 140 mg/dL

## 2015-10-30 LAB — LIPID PANEL
Cholesterol: 176 mg/dL (ref 125–200)
HDL: 66 mg/dL (ref >=40)
LDL CALC: 98 mg/dL (ref <130)
TRIGLYCERIDES: 59 mg/dL (ref <150)
Total CHOL/HDL Ratio: 2.7 Ratio (ref <=5.0)
VLDL: 12 mg/dL (ref <30)

## 2015-11-05 ENCOUNTER — Ambulatory Visit: Payer: Medicare Other | Admitting: Occupational Therapy

## 2015-11-05 DIAGNOSIS — M25642 Stiffness of left hand, not elsewhere classified: Secondary | ICD-10-CM

## 2015-11-05 DIAGNOSIS — M25542 Pain in joints of left hand: Secondary | ICD-10-CM | POA: Diagnosis not present

## 2015-11-05 DIAGNOSIS — R29898 Other symptoms and signs involving the musculoskeletal system: Secondary | ICD-10-CM

## 2015-11-05 NOTE — Patient Instructions (Signed)
MP Flexion (Active Isolated)   Bend __each____ finger at large knuckle, keeping other fingers straight. Do not bend tips. Repeat _10-15___ times. Do __4-6__ sessions per day.  AROM: PIP Flexion / Extension   Pinch bottom knuckle of __ring , small______ finger of hand to prevent bending. Actively bend middle knuckle until stretch is felt. Hold __5__ seconds. Relax. Straighten finger as far as possible. Repeat __10-15__ times per set. Do _4-6___ sessions per day.   AROM: DIP Flexion / Extension   Pinch middle knuckle of ____all fingers ____ finger of  hand to prevent bending. Bend end knuckle until stretch is felt. Hold _5___ seconds. Relax. Straighten finger as far as possible. Repeat _10-15___ times per set.  Do _4-6___ sessions per day.  AROM: Finger Flexion / Extension   Actively bend fingers of  hand. Start with knuckles furthest from palm, and slowly make a fist. Hold __5__ seconds. Relax. Then straighten fingers as far as possible. Repeat _10-15___ times per set.  Do _4-6___ sessions per day.  Copyright  VHI. All rights reserved.             Copyright  VHI. All rights reserved.      PROM: Finger MP Joints   Passively bend ____each____ finger of hand at big knuckle until stretch is felt. Hold _10___ seconds. Relax. Straighten finger as far as possible. Repeat __5__ times per set.  Do __4-6__ sessions per day.   PIP Flexion (Passive)   Use other hand to bend the middle joint of ___each___ finger down as far as possible. Hold _10___ seconds. Repeat __5__ times. Do _4-6___ sessions per day. Be gentle with middle finger    PROM: Finger DIP Joints   Passively bend ___each_____ finger(s) of  hand at tip joint until stretch is felt. Hold __10__ seconds. Relax. Straighten finger as far as possible. Repeat _5___ times per set.  Do __4-6__ sessions per day.   \

## 2015-11-06 NOTE — Therapy (Signed)
[facility] [address] [place], [place], [address] Phone: [phone]   Fax:  [phone]  Occupational Therapy Treatment  Patient Details  Name: Todd Lawrence MRN: [id number] Date of Birth: 1951/02/27 Referring Provider: Dr. Tressa Busman  Encounter Date: 11/05/2015      OT End of Session - 11/06/15 1309    Visit Number 2   Number of Visits 17   Date for OT Re-Evaluation 12/27/15   Authorization Type [facility]    OT Start Time 1104   OT Stop Time 1145   OT Time Calculation (min) 41 min   Activity Tolerance Patient limited by pain   Behavior During Therapy [facility] for tasks assessed/performed      Past Medical History  Diagnosis Date  . Hyperlipidemia   . Diabetes mellitus without complication [facility])     Past Surgical History  Procedure Laterality Date  . Foot surgery    . Cervical fusion    . Knee arthroscopy      There were no vitals filed for this visit.          Pt was educated in A/ROM and P/ROM HEP see pt instructions. NMES to finger flexors with middle and ring finger buddy strapped together, 50 pps, 250 ps, 10 sec cycle , intensity 16 x 10 mins, no adverse reactions.                  OT Education - 11/05/15 1314    Education provided Yes   Education Details A/ROM, P/ROM HEP   Person(s) Educated Patient   Methods Explanation;Demonstration;Verbal cues;Handout   Comprehension Verbalized understanding;Returned demonstration          OT Short Term Goals - 10/28/15 0930    OT SHORT TERM GOAL #1   Title Independent with updated HEP PRN (DUE 11/27/15)   Time 4   Period Weeks   Status New   OT SHORT TERM GOAL #2   Title Independent with edema reduction strategies   Time 4   Period Weeks   Status New   OT SHORT TERM GOAL #3   Title Pt to verbalize understanding with potential A/E needs and how to acquire for one handed use to increase independence with ADLS  (including for cutting food and tying shoes)    Time 4   Period Weeks   Status New   OT SHORT TERM GOAL #4   Title Pt to demo improved ROM in all remaining digits of Lt hand at MP's and PIP's by 10 degrees or greater   Time 4   Period Weeks   Status New           OT Long Term Goals - 10/28/15 1047    OT LONG TERM GOAL #1   Title Pt to be mod I for all ADLS with compensatory strategies and A/E prn (all LTG's due 12/27/15)   Time 8   Period Weeks   Status New   OT LONG TERM GOAL #2   Title Pt to verbalize understanding with pain reduction strategies   Time 8   Period Weeks   Status New   OT LONG TERM GOAL #3   Title Pt to increase ROM in all remaining digits Lt hand at MP and PIP joints by 20 degrees from initial evaluation for increased functional use/gripping   Time 8   Period Weeks   Status New   OT LONG TERM GOAL #4   Title Pt to  demo 10 lbs or greater grip strength Lt hand to assist with opening jars/containers   Time 8   Period Weeks   Status New               Plan - 11/06/15 1308    Clinical Impression Statement Pt is progressing slowly towards goals, he demonstrates significant ROM limitations in digits 2,3.   Rehab Potential Fair   Clinical Impairments Affecting Rehab Potential severity of deficits   OT Frequency 2x / week   OT Duration 8 weeks   OT Treatment/Interventions Self-care/ADL training;Therapeutic exercise;Patient/family education;Manual Therapy;Splinting;Therapeutic exercises;Parrafin;DME and/or AE instruction;Compression bandaging;Therapeutic activities;Electrical Stimulation;Fluidtherapy;Scar mobilization;Moist Heat;Contrast Bath;Passive range of motion   Plan progress A/ROM and P/ROM as able   Consulted and Agree with Plan of Care Patient;Family member/caregiver      Patient will benefit from skilled therapeutic intervention in order to improve the following deficits and impairments:  Decreased coordination, Impaired flexibility, Increased  edema, Impaired sensation, Decreased skin integrity, Impaired UE functional use, Pain, Decreased strength  Visit Diagnosis: Pain in joints of left hand  Stiffness of left hand, not elsewhere classified  Other symptoms and signs involving the musculoskeletal system    Problem List Patient Active Problem List   Diagnosis Date Noted  . Prediabetes     Todd Lawrence 11/06/2015, 1:11 PM [name], OTR/L [phone] Phone: [phone] 1:11 PM 11/06/2015 [facility] [address] [place], [place], [address] Phone: [phone]   Fax:  [phone]  Name: Todd Lawrence MRN: [id number] Date of Birth: 1951/03/16

## 2015-11-07 ENCOUNTER — Ambulatory Visit: Payer: Medicare Other | Admitting: Occupational Therapy

## 2015-11-07 DIAGNOSIS — M25542 Pain in joints of left hand: Secondary | ICD-10-CM | POA: Diagnosis not present

## 2015-11-07 DIAGNOSIS — R29898 Other symptoms and signs involving the musculoskeletal system: Secondary | ICD-10-CM

## 2015-11-07 DIAGNOSIS — R208 Other disturbances of skin sensation: Secondary | ICD-10-CM

## 2015-11-07 DIAGNOSIS — M25642 Stiffness of left hand, not elsewhere classified: Secondary | ICD-10-CM

## 2015-11-07 NOTE — Therapy (Signed)
[facility] [address] [place], [place], [address] Phone: [phone]   Fax:  [phone]  Occupational Therapy Treatment  Patient Details  Name: Todd Lawrence MRN: [id number] Date of Birth: 1951-04-10 Referring Provider: Dr. Tressa Busman  Encounter Date: 11/07/2015      OT End of Session - 11/07/15 1220    Visit Number 3   Number of Visits 17   Date for OT Re-Evaluation 12/27/15   Authorization Type [facility]    OT Start Time 1104   OT Stop Time 1154   OT Time Calculation (min) 50 min   Activity Tolerance Patient tolerated treatment well   Behavior During Therapy [facility] for tasks assessed/performed      Past Medical History  Diagnosis Date  . Hyperlipidemia   . Diabetes mellitus without complication [facility])     Past Surgical History  Procedure Laterality Date  . Foot surgery    . Cervical fusion    . Knee arthroscopy      There were no vitals filed for this visit.      Subjective Assessment - 11/07/15 1104    Patient is accompained by: Family member   Limitations picc line RUE for antibiotics - no lifting greater than 10 lbs (due to picc line)   Patient Stated Goals Get as much use with Lt hand as possible, pick up my grandson   Currently in Pain? Yes   Pain Location Hand   Pain Orientation Left   Pain Descriptors / Indicators Sharp   Pain Type Acute pain   Pain Onset More than a month ago           Moderate edema in left hand today. AA/ROM flexion and extension compositely to all digits then ring and small. P/ROM to MP joinsts of digits 2,3 followed by AA/ROM and gentle passive ROM at PIP's    NMES to finger flexors with middle and ring finger buddy strapped together, 50 pps, 250 ps, 10 sec cycle , intensity 16 x 10 mins, no adverse reactions.          finger tips were cutout of edema glove so that pt can monitor capillary refill.       OT Education - 11/07/15 1223     Education provided Yes   Education Details edema glove, flexion glove application  wear, care and precautions   Person(s) Educated Patient   Methods Explanation;Demonstration;Tactile cues;Handout;Verbal cues   Comprehension Verbalized understanding;Returned demonstration          OT Short Term Goals - 10/28/15 0930    OT SHORT TERM GOAL #1   Title Independent with updated HEP PRN (DUE 11/27/15)   Time 4   Period Weeks   Status New   OT SHORT TERM GOAL #2   Title Independent with edema reduction strategies   Time 4   Period Weeks   Status New   OT SHORT TERM GOAL #3   Title Pt to verbalize understanding with potential A/E needs and how to acquire for one handed use to increase independence with ADLS (including for cutting food and tying shoes)    Time 4   Period Weeks   Status New   OT SHORT TERM GOAL #4   Title Pt to demo improved ROM in all remaining digits of Lt hand at MP's and PIP's by 10 degrees or greater   Time 4   Period Weeks   Status New  OT Long Term Goals - 10/28/15 1047    OT LONG TERM GOAL #1   Title Pt to be mod I for all ADLS with compensatory strategies and A/E prn (all LTG's due 12/27/15)   Time 8   Period Weeks   Status New   OT LONG TERM GOAL #2   Title Pt to verbalize understanding with pain reduction strategies   Time 8   Period Weeks   Status New   OT LONG TERM GOAL #3   Title Pt to increase ROM in all remaining digits Lt hand at MP and PIP joints by 20 degrees from initial evaluation for increased functional use/gripping   Time 8   Period Weeks   Status New   OT LONG TERM GOAL #4   Title Pt to demo 10 lbs or greater grip strength Lt hand to assist with opening jars/containers   Time 8   Period Weeks   Status New               Plan - 11/07/15 1218    Clinical Impression Statement t is progressing towards goals. He remins limited by edema and stiffness in digits 2, 3.   Rehab Potential Fair   Clinical Impairments  Affecting Rehab Potential severity of deficits   OT Frequency 2x / week   OT Duration 8 weeks   OT Treatment/Interventions Self-care/ADL training;Therapeutic exercise;Patient/family education;Manual Therapy;Splinting;Therapeutic exercises;Parrafin;DME and/or AE instruction;Compression bandaging;Therapeutic activities;Electrical Stimulation;Fluidtherapy;Scar mobilization;Moist Heat;Contrast Bath;Passive range of motion   Plan check edema glove and flexion glove, continue A/ROM, P/ROM and estim   OT Home Exercise Plan issued A/ROM, P/ROM   Consulted and Agree with Plan of Care Patient      Patient will benefit from skilled therapeutic intervention in order to improve the following deficits and impairments:  Decreased coordination, Impaired flexibility, Increased edema, Impaired sensation, Decreased skin integrity, Impaired UE functional use, Pain, Decreased strength  Visit Diagnosis: Pain in joints of left hand  Stiffness of left hand, not elsewhere classified  Other symptoms and signs involving the musculoskeletal system  Other disturbances of skin sensation    Problem List Patient Active Problem List   Diagnosis Date Noted  . Prediabetes     Lawrence,Todd 11/07/2015, 12:23 PM  [facility] [address] [place], [place], [address] Phone: [phone]   Fax:  [phone]  Name: Todd Lawrence MRN: [id number] Date of Birth: 1950/12/04

## 2015-11-07 NOTE — Patient Instructions (Signed)
Wear edema glove for several hours per day while awake initally, monitor blood flow to fingertips, remove if increased pain or decreased blood flow to fingertips(purple coloration or whiteness.) If no problems you may sleep in edema glove.  Flexion glove, wear for 10-15 mins while awake 1-2 x day initially, you may progress to wearing 3x day if no problems.

## 2015-11-11 ENCOUNTER — Ambulatory Visit: Payer: Medicare Other | Attending: Plastic Surgery | Admitting: Occupational Therapy

## 2015-11-11 DIAGNOSIS — R29898 Other symptoms and signs involving the musculoskeletal system: Secondary | ICD-10-CM | POA: Diagnosis present

## 2015-11-11 DIAGNOSIS — M25642 Stiffness of left hand, not elsewhere classified: Secondary | ICD-10-CM | POA: Insufficient documentation

## 2015-11-11 DIAGNOSIS — R600 Localized edema: Secondary | ICD-10-CM | POA: Diagnosis present

## 2015-11-11 DIAGNOSIS — M25542 Pain in joints of left hand: Secondary | ICD-10-CM | POA: Diagnosis present

## 2015-11-11 DIAGNOSIS — R208 Other disturbances of skin sensation: Secondary | ICD-10-CM | POA: Diagnosis present

## 2015-11-11 NOTE — Therapy (Signed)
[facility] [address] [place], [place], [address] Phone: [phone]   Fax:  [phone]  Occupational Therapy Treatment  Patient Details  Name: Todd Lawrence MRN: [id number] Date of Birth: [birth year] Referring Provider: Dr. Tressa Busman  Encounter Date: 11/11/2015      OT End of Session - 11/11/15 0856    Visit Number 4   Number of Visits 17   Date for OT Re-Evaluation 12/27/15   Authorization Type [employer] [facility]    OT Start Time 0800   OT Stop Time 0845   OT Time Calculation (min) 45 min   Activity Tolerance Patient tolerated treatment well      Past Medical History  Diagnosis Date  . Hyperlipidemia   . Diabetes mellitus without complication [facility])     Past Surgical History  Procedure Laterality Date  . Foot surgery    . Cervical fusion    . Knee arthroscopy      There were no vitals filed for this visit.      Subjective Assessment - 11/11/15 0804    Subjective  The gloves are doing well   Patient is accompained by: Family member   Patient Stated Goals Get as much use with Lt hand as possible, pick up my grandson   Currently in Pain? Yes   Pain Location Hand   Pain Orientation Left   Pain Descriptors / Indicators Sharp   Pain Type Acute pain   Pain Onset More than a month ago   Pain Frequency Intermittent                      OT Treatments/Exercises (OP) - 11/11/15 0001    Exercises   Exercises Hand   Hand Exercises   Other Hand Exercises A/ROM and P/ROM to all digits at MP's and PIP's isolated and compositely   Modalities   Modalities Electrical Stimulation   Electrical Stimulation   Electrical Stimulation Location volar forearm   Electrical Stimulation Action Finger flexors as able (also illiciting wrist flexion). Ring finger with most activation   Electrical Stimulation Parameters 50 pps, 250 pw, 10 sec. on/off x 15 minutes   Electrical Stimulation Goals  Neuromuscular facilitation  ROM   Manual Therapy   Manual Therapy Joint mobilization   Joint Mobilization Between metacarpals with noted tightness b/t 2nd and 3rd metacarpal                  OT Short Term Goals - 10/28/15 0930    OT SHORT TERM GOAL #1   Title Independent with updated HEP PRN (DUE 11/27/15)   Time 4   Period Weeks   Status New   OT SHORT TERM GOAL #2   Title Independent with edema reduction strategies   Time 4   Period Weeks   Status New   OT SHORT TERM GOAL #3   Title Pt to verbalize understanding with potential A/E needs and how to acquire for one handed use to increase independence with ADLS (including for cutting food and tying shoes)    Time 4   Period Weeks   Status New   OT SHORT TERM GOAL #4   Title Pt to demo improved ROM in all remaining digits of Lt hand at MP's and PIP's by 10 degrees or greater   Time 4   Period Weeks   Status New           OT Long Term Goals - 10/28/15  Felt #1   Title Pt to be mod I for all ADLS with compensatory strategies and A/E prn (all LTG's due 12/27/15)   Time 8   Period Weeks   Status New   OT LONG TERM GOAL #2   Title Pt to verbalize understanding with pain reduction strategies   Time 8   Period Weeks   Status New   OT LONG TERM GOAL #3   Title Pt to increase ROM in all remaining digits Lt hand at MP and PIP joints by 20 degrees from initial evaluation for increased functional use/gripping   Time 8   Period Weeks   Status New   OT LONG TERM GOAL #4   Title Pt to demo 10 lbs or greater grip strength Lt hand to assist with opening jars/containers   Time 8   Period Weeks   Status New               Plan - 11/11/15 0857    Clinical Impression Statement Pt with slightly increased P/ROM index finger but limited by edema and stiffness index and long finger   Rehab Potential Fair   Clinical Impairments Affecting Rehab Potential severity of deficits   OT Frequency 2x / week    OT Treatment/Interventions Self-care/ADL training;Therapeutic exercise;Patient/family education;Manual Therapy;Splinting;Therapeutic exercises;Parrafin;DME and/or AE instruction;Compression bandaging;Therapeutic activities;Electrical Stimulation;Fluidtherapy;Scar mobilization;Moist Heat;Contrast Bath;Passive range of motion   Plan continue A/ROM, P/ROM, joint mobs and estim   OT Home Exercise Plan issued A/ROM, P/ROM   Consulted and Agree with Plan of Care Patient;Family member/caregiver   Family Member Consulted wife      Patient will benefit from skilled therapeutic intervention in order to improve the following deficits and impairments:  Decreased coordination, Impaired flexibility, Increased edema, Impaired sensation, Decreased skin integrity, Impaired UE functional use, Pain, Decreased strength  Visit Diagnosis: Stiffness of left hand, not elsewhere classified  Other symptoms and signs involving the musculoskeletal system  Pain in joints of left hand    Problem List Patient Active Problem List   Diagnosis Date Noted  . Prediabetes     Carey Bullocks, OTR/L 11/11/2015, 8:59 AM  [facility] [address] [place], [place], [address] Phone: [phone]   Fax:  [phone]  Name: Todd Lawrence MRN: [id number] Date of Birth: 24-Oct-1950

## 2015-11-13 ENCOUNTER — Encounter: Payer: Self-pay | Admitting: Occupational Therapy

## 2015-11-13 ENCOUNTER — Ambulatory Visit: Payer: Medicare Other | Admitting: Occupational Therapy

## 2015-11-13 DIAGNOSIS — M25642 Stiffness of left hand, not elsewhere classified: Secondary | ICD-10-CM | POA: Diagnosis not present

## 2015-11-13 DIAGNOSIS — R29898 Other symptoms and signs involving the musculoskeletal system: Secondary | ICD-10-CM

## 2015-11-13 DIAGNOSIS — M25542 Pain in joints of left hand: Secondary | ICD-10-CM

## 2015-11-13 NOTE — Therapy (Signed)
[facility] [address] [place], [place], [address] Phone: [phone]   Fax:  [phone]  Occupational Therapy Treatment  Patient Details  Name: Todd Lawrence MRN: [id number] Date of Birth: [birth year] Referring Provider: Dr. Tressa Busman  Encounter Date: 11/13/2015      OT End of Session - 11/13/15 0838    Visit Number 5   Number of Visits 17   Date for OT Re-Evaluation 12/27/15   Authorization Type [employer] [facility] [facility]    OT Start Time 0800   OT Stop Time 0845   OT Time Calculation (min) 45 min   Activity Tolerance Patient tolerated treatment well      Past Medical History  Diagnosis Date  . Hyperlipidemia   . Diabetes mellitus without complication [facility])     Past Surgical History  Procedure Laterality Date  . Foot surgery    . Cervical fusion    . Knee arthroscopy      There were no vitals filed for this visit.      Subjective Assessment - 11/13/15 0804    Patient Stated Goals Get as much use with Lt hand as possible, pick up my grandson   Currently in Pain? Yes   Pain Score 9    Pain Location Hand   Pain Orientation Left   Pain Descriptors / Indicators Sharp   Pain Type Acute pain   Pain Onset More than a month ago   Pain Frequency Intermittent   Aggravating Factors  only with P/ROM   Pain Relieving Factors massage                      OT Treatments/Exercises (OP) - 11/13/15 0001    Hand Exercises   Other Hand Exercises A/ROM and P/ROM to all digits at MP's and PIP's isolated and compositely   Modalities   Modalities Electrical Stimulation   Electrical Stimulation   Electrical Stimulation Location volar forearm   Electrical Stimulation Action finger flexors as able (also illiciting wrist flexion)   Electrical Stimulation Parameters 50 pps, 250 pw, 10 sec. on/off x 10 minutes   Electrical Stimulation Goals Neuromuscular facilitation   Manual Therapy   Manual Therapy  Joint mobilization   Joint Mobilization Between metacarpals with noted tightness b/t 2nd and 3rd metacarpal                  OT Short Term Goals - 10/28/15 0930    OT SHORT TERM GOAL #1   Title Independent with updated HEP PRN (DUE 11/27/15)   Time 4   Period Weeks   Status New   OT SHORT TERM GOAL #2   Title Independent with edema reduction strategies   Time 4   Period Weeks   Status New   OT SHORT TERM GOAL #3   Title Pt to verbalize understanding with potential A/E needs and how to acquire for one handed use to increase independence with ADLS (including for cutting food and tying shoes)    Time 4   Period Weeks   Status New   OT SHORT TERM GOAL #4   Title Pt to demo improved ROM in all remaining digits of Lt hand at MP's and PIP's by 10 degrees or greater   Time 4   Period Weeks   Status New           OT Long Term Goals - 10/28/15 1047    OT LONG TERM GOAL #1  Title Pt to be mod I for all ADLS with compensatory strategies and A/E prn (all LTG's due 12/27/15)   Time 8   Period Weeks   Status New   OT LONG TERM GOAL #2   Title Pt to verbalize understanding with pain reduction strategies   Time 8   Period Weeks   Status New   OT LONG TERM GOAL #3   Title Pt to increase ROM in all remaining digits Lt hand at MP and PIP joints by 20 degrees from initial evaluation for increased functional use/gripping   Time 8   Period Weeks   Status New   OT LONG TERM GOAL #4   Title Pt to demo 10 lbs or greater grip strength Lt hand to assist with opening jars/containers   Time 8   Period Weeks   Status New               Plan - 11/13/15 [id number]    Clinical Impression Statement Pt tolerating P/ROM with increased tolerance today   Clinical Impairments Affecting Rehab Potential severity of deficits   OT Frequency 2x / week   OT Treatment/Interventions Self-care/ADL training;Therapeutic exercise;Patient/family education;Manual Therapy;Splinting;Therapeutic  exercises;Parrafin;DME and/or AE instruction;Compression bandaging;Therapeutic activities;Electrical Stimulation;Fluidtherapy;Scar mobilization;Moist Heat;Contrast Bath;Passive range of motion   Plan start assessing STG's, continue A/ROM, P/ROM, pulsed ultrasound, A/E needs for tying shoes   Consulted and Agree with Plan of Care Patient      Patient will benefit from skilled therapeutic intervention in order to improve the following deficits and impairments:  Decreased coordination, Impaired flexibility, Increased edema, Impaired sensation, Decreased skin integrity, Impaired UE functional use, Pain, Decreased strength  Visit Diagnosis: Stiffness of left hand, not elsewhere classified  Other symptoms and signs involving the musculoskeletal system  Pain in joints of left hand    Problem List Patient Active Problem List   Diagnosis Date Noted  . Prediabetes     Carey Bullocks, OTR/L 11/13/2015, 8:50 AM  [facility] [address] [place], [place], [address] Phone: [phone]   Fax:  [phone]  Name: Todd Lawrence MRN: [id number] Date of Birth: Oct 25, 1950

## 2015-11-18 ENCOUNTER — Encounter: Payer: Self-pay | Admitting: Occupational Therapy

## 2015-11-18 ENCOUNTER — Ambulatory Visit: Payer: Medicare Other | Admitting: Occupational Therapy

## 2015-11-18 DIAGNOSIS — M25542 Pain in joints of left hand: Secondary | ICD-10-CM

## 2015-11-18 DIAGNOSIS — M25642 Stiffness of left hand, not elsewhere classified: Secondary | ICD-10-CM | POA: Diagnosis not present

## 2015-11-18 DIAGNOSIS — R29898 Other symptoms and signs involving the musculoskeletal system: Secondary | ICD-10-CM

## 2015-11-18 NOTE — Therapy (Signed)
[facility] [address] [place], [place], [address] Phone: [phone]   Fax:  [phone]  Occupational Therapy Treatment  Patient Details  Name: Todd Lawrence MRN: [id number] Date of Birth: [birth year] Referring Provider: Dr. Tressa Busman  Encounter Date: 11/18/2015      OT End of Session - 11/18/15 1050    Visit Number 6   Number of Visits 17   Date for OT Re-Evaluation 12/27/15   Authorization Type [employer] [facility]    OT Start Time 0800   OT Stop Time 0845   OT Time Calculation (min) 45 min   Activity Tolerance Patient tolerated treatment well      Past Medical History  Diagnosis Date  . Hyperlipidemia   . Diabetes mellitus without complication [facility])     Past Surgical History  Procedure Laterality Date  . Foot surgery    . Cervical fusion    . Knee arthroscopy      There were no vitals filed for this visit.      Subjective Assessment - 11/18/15 0802    Subjective  I have an appointment with Dr. Grandville Silos tomorrow   Patient Stated Goals Get as much use with Lt hand as possible, pick up my grandson   Currently in Pain? Yes   Pain Score 8    Pain Location Hand   Pain Orientation Right   Pain Descriptors / Indicators Sharp   Pain Type Acute pain   Pain Onset More than a month ago   Pain Frequency Intermittent   Aggravating Factors  only with P/ROM   Pain Relieving Factors massage                      OT Treatments/Exercises (OP) - 11/18/15 0001    ADLs   ADL Comments Pt sees Dr. Grandville Silos tomorrow - therapist sent note to MD re: therapy and request clarification on progression of therapy (use of heat modalities, strengthening progression, and any splinting recommendations). Also discussed potential A/E needs and provided handouts and how/where to purchase including: rocker knife, options for shoe tying including shoe buttons, velcro converters, or lace locks; bath mitt, and LH  sponge. Also began assessing STG's and progress to date   Hand Exercises   Other Hand Exercises A/ROM and P/ROM to all digits at MP's and PIP's isolated and compositely - pt unable to actively flex PIP's at index and long finger with MP's blocked   Modalities   Modalities Ultrasound   Ultrasound   Ultrasound Location palm (base of index and long finger)   Ultrasound Parameters 3.3 Mhz, 50% pulsed, 0.8 wts/cm2 x 8 minutes   (Note: only use pulsed until pt has completed oral antibiotics)   Ultrasound Goals Edema  scar tissue mngmt, and circulation   Manual Therapy   Manual Therapy Passive ROM   Joint Mobilization Between metacarpals with noted tightness b/t 2nd and 3rd metacarpal   Passive ROM to all joints of hand at MP's, PIP's isolated and compositely                  OT Short Term Goals - 11/18/15 1051    OT SHORT TERM GOAL #1   Title Independent with updated HEP PRN (DUE 11/27/15)   Time 4   Period Weeks   Status Achieved   OT SHORT TERM GOAL #2   Title Independent with edema reduction strategies   Time 4   Period Weeks   Status  Achieved   OT SHORT TERM GOAL #3   Title Pt to verbalize understanding with potential A/E needs and how to acquire for one handed use to increase independence with ADLS (including for cutting food and tying shoes)    Time 4   Period Weeks   Status Achieved   OT SHORT TERM GOAL #4   Title Pt to demo improved ROM in all remaining digits of Lt hand at MP's and PIP's by 10 degrees or greater   Time 4   Period Weeks   Status On-going           OT Long Term Goals - 10/28/15 1047    OT LONG TERM GOAL #1   Title Pt to be mod I for all ADLS with compensatory strategies and A/E prn (all LTG's due 12/27/15)   Time 8   Period Weeks   Status New   OT LONG TERM GOAL #2   Title Pt to verbalize understanding with pain reduction strategies   Time 8   Period Weeks   Status New   OT LONG TERM GOAL #3   Title Pt to increase ROM in all remaining  digits Lt hand at MP and PIP joints by 20 degrees from initial evaluation for increased functional use/gripping   Time 8   Period Weeks   Status New   OT LONG TERM GOAL #4   Title Pt to demo 10 lbs or greater grip strength Lt hand to assist with opening jars/containers   Time 8   Period Weeks   Status New               Plan - 11/18/15 1051    Clinical Impression Statement Pt met STG's #1-3. Pt with decr. edema and incr. ROM of hand but index and long finger remain very stiff/limited.    Rehab Potential Fair   Clinical Impairments Affecting Rehab Potential severity of deficits   OT Frequency 2x / week   OT Duration 8 weeks   OT Treatment/Interventions Self-care/ADL training;Therapeutic exercise;Patient/family education;Manual Therapy;Splinting;Therapeutic exercises;Parrafin;DME and/or AE instruction;Compression bandaging;Therapeutic activities;Electrical Stimulation;Fluidtherapy;Scar mobilization;Moist Heat;Contrast Bath;Passive range of motion   Plan follow up with MD recommendations (note sent by therapist this session), assess ROM (Remaining STG), continue pulsed [place] and ROM   OT Home Exercise Plan issued A/ROM, P/ROM   Consulted and Agree with Plan of Care Patient      Patient will benefit from skilled therapeutic intervention in order to improve the following deficits and impairments:  Decreased coordination, Impaired flexibility, Increased edema, Impaired sensation, Decreased skin integrity, Impaired UE functional use, Pain, Decreased strength  Visit Diagnosis: Stiffness of left hand, not elsewhere classified  Other symptoms and signs involving the musculoskeletal system  Pain in joints of left hand    Problem List Patient Active Problem List   Diagnosis Date Noted  . Prediabetes     Carey Bullocks, OTR/L 11/18/2015, 10:54 AM  [facility] [address] [place], [place], [address] Phone:  [phone]   Fax:  [phone]  Name: Todd Lawrence MRN: [id number] Date of Birth: 11-11-1950

## 2015-11-20 ENCOUNTER — Encounter: Payer: Self-pay | Admitting: Occupational Therapy

## 2015-11-20 ENCOUNTER — Ambulatory Visit: Payer: Medicare Other | Admitting: Occupational Therapy

## 2015-11-20 DIAGNOSIS — M25642 Stiffness of left hand, not elsewhere classified: Secondary | ICD-10-CM

## 2015-11-20 DIAGNOSIS — R29898 Other symptoms and signs involving the musculoskeletal system: Secondary | ICD-10-CM

## 2015-11-20 DIAGNOSIS — R208 Other disturbances of skin sensation: Secondary | ICD-10-CM

## 2015-11-20 DIAGNOSIS — M25542 Pain in joints of left hand: Secondary | ICD-10-CM

## 2015-11-20 NOTE — Patient Instructions (Signed)
1. Grip Strengthening (Resistive Putty)   Squeeze putty using thumb and all fingers. Repeat _20___ times. Do __2__ sessions per day. (Emphasis more base of thumb muscles and ring/small finger)    2. IP Fisting (Resistive Putty)    Place putty higher up fingers. Keeping knuckles straight, bend fingertips to squeeze putty. Repeat __15__ times. Do __2__ sessions per day. (Emphasis more for index and long finger as able)     3. Adduction (Resistive Putty)    Press/squeeze between 2 fingers . Repeat with all fingers. Repeat __15__ times. Do __2__ sessions per day.   4. Extension (Resistive Putty)    Place putty loop around fingers. Stretch loop by opening hand. Instead of using thumb, hold down with other hand. (can also do rubberband in place of putty)  Repeat _15___ times. Do __2__ sessions per day.  Copyright  VHI. All rights reserved.      Copyright  VHI. All rights reserved.

## 2015-11-20 NOTE — Therapy (Signed)
[place] [address] [place], [place], [address] Phone: [phone]   Fax:  [phone]  Occupational Therapy Treatment  Patient Details  Name: Todd Lawrence MRN: [id number] Date of Birth: March 04, 1951 Referring Provider: Dr. Tressa Busman  Encounter Date: 11/20/2015      OT End of Session - 11/20/15 1014    Visit Number 7   Number of Visits 17   Date for OT Re-Evaluation 12/27/15   Authorization Type [employer] [facility]    Authorization - Visit Number 7   Authorization - Number of Visits 20   OT Start Time 0800   OT Stop Time 0840   OT Time Calculation (min) 40 min   Activity Tolerance Patient tolerated treatment well      Past Medical History  Diagnosis Date  . Hyperlipidemia   . Diabetes mellitus without complication [facility])     Past Surgical History  Procedure Laterality Date  . Foot surgery    . Cervical fusion    . Knee arthroscopy      There were no vitals filed for this visit.      Subjective Assessment - 11/20/15 0934    Limitations    Patient Stated Goals Get as much use with Lt hand as possible, pick up my grandson   Currently in Pain? Yes   Pain Score 2    Pain Location Hand   Pain Orientation Right   Pain Descriptors / Indicators Sharp   Pain Type Acute pain   Pain Frequency Intermittent   Aggravating Factors  P/ROM   Pain Relieving Factors massage                      OT Treatments/Exercises (OP) - 11/20/15 0001    ADLs   ADL Comments Per MD - ok for strengthening, ok with heat modalities (but using caution to prevent risk of burns with decreased sensation). Therapist still used pulsed [place] today for scar management and until oral antibiotics complete.    Hand Exercises   Other Hand Exercises A/ROM and P/ROM to all digits at MP's and PIP's isolated and compositely - pt unable to actively flex PIP's at index and long finger with MP's blocked   Other Hand Exercises  Issued putty HEP with yellow resistance putty (pt cleared by MD - see note scanned in EPIC) - See Pt instructions for details   Ultrasound   Ultrasound Location palm (base of index and long finger)   Ultrasound Parameters 3.3 Mhz, 50% pulsed, 9.8 wts/cm2 x 8 minutes   Ultrasound Goals Edema  scar tissue mngmt                OT Education - 11/20/15 1009    Education provided Yes   Education Details Putty HEP    Person(s) Educated Patient   Methods Explanation;Demonstration;Handout   Comprehension Verbalized understanding;Returned demonstration          OT Short Term Goals - 11/18/15 1051    OT SHORT TERM GOAL #1   Title Independent with updated HEP PRN (DUE 11/27/15)   Time 4   Period Weeks   Status Achieved   OT SHORT TERM GOAL #2   Title Independent with edema reduction strategies   Time 4   Period Weeks   Status Achieved   OT SHORT TERM GOAL #3   Title Pt to verbalize understanding with potential A/E needs and how to acquire for one handed use to increase independence  with ADLS (including for cutting food and tying shoes)    Time 4   Period Weeks   Status Achieved   OT SHORT TERM GOAL #4   Title Pt to demo improved ROM in all remaining digits of Lt hand at MP's and PIP's by 10 degrees or greater   Time 4   Period Weeks   Status On-going           OT Long Term Goals - 10/28/15 1047    OT LONG TERM GOAL #1   Title Pt to be mod I for all ADLS with compensatory strategies and A/E prn (all LTG's due 12/27/15)   Time 8   Period Weeks   Status New   OT LONG TERM GOAL #2   Title Pt to verbalize understanding with pain reduction strategies   Time 8   Period Weeks   Status New   OT LONG TERM GOAL #3   Title Pt to increase ROM in all remaining digits Lt hand at MP and PIP joints by 20 degrees from initial evaluation for increased functional use/gripping   Time 8   Period Weeks   Status New   OT LONG TERM GOAL #4   Title Pt to demo 10 lbs or greater grip  strength Lt hand to assist with opening jars/containers   Time 8   Period Weeks   Status New               Plan - 11/20/15 1319    Clinical Impression Statement Pt continues to make slow progress in Lt hand   Rehab Potential Fair   Clinical Impairments Affecting Rehab Potential severity of deficits   OT Frequency 2x / week   OT Duration 8 weeks   OT Treatment/Interventions Self-care/ADL training;Therapeutic exercise;Patient/family education;Manual Therapy;Splinting;Therapeutic exercises;Parrafin;DME and/or AE instruction;Compression bandaging;Therapeutic activities;Electrical Stimulation;Fluidtherapy;Scar mobilization;Moist Heat;Contrast Bath;Passive range of motion   Plan assess ROM (remaining STG), discuss visit limit with insurance, ?estim with buddy strap, continue [place], ROM, Review putty HEP PRN   OT Home Exercise Plan issued A/ROM, P/ROM, putty   Consulted and Agree with Plan of Care Patient      Patient will benefit from skilled therapeutic intervention in order to improve the following deficits and impairments:  Decreased coordination, Impaired flexibility, Increased edema, Impaired sensation, Decreased skin integrity, Impaired UE functional use, Pain, Decreased strength  Visit Diagnosis: Stiffness of left hand, not elsewhere classified  Other symptoms and signs involving the musculoskeletal system  Pain in joints of left hand  Other disturbances of skin sensation    Problem List Patient Active Problem List   Diagnosis Date Noted  . Prediabetes     Carey Bullocks, OTR/L 11/20/2015, 1:57 PM  [place] [address] [place], [place], [address] Phone: [phone]   Fax:  [phone]  Name: Todd Lawrence MRN: [id number] Date of Birth: 1951/01/27

## 2015-11-25 ENCOUNTER — Ambulatory Visit: Payer: Medicare Other | Admitting: Occupational Therapy

## 2015-11-25 ENCOUNTER — Encounter: Payer: Self-pay | Admitting: Occupational Therapy

## 2015-11-25 DIAGNOSIS — M25642 Stiffness of left hand, not elsewhere classified: Secondary | ICD-10-CM | POA: Diagnosis not present

## 2015-11-25 DIAGNOSIS — R600 Localized edema: Secondary | ICD-10-CM

## 2015-11-25 DIAGNOSIS — R29898 Other symptoms and signs involving the musculoskeletal system: Secondary | ICD-10-CM

## 2015-11-25 NOTE — Therapy (Signed)
[place] [address] [place], [place], [address] Phone: [phone]   Fax:  [phone]  Occupational Therapy Treatment  Patient Details  Name: Todd Lawrence MRN: [id number] Date of Birth: Jan 29, 1951 Referring Provider: Dr. Tressa Busman  Encounter Date: 11/25/2015      OT End of Session - 11/25/15 0851    Visit Number 8   Number of Visits 17   Date for OT Re-Evaluation 12/27/15   Authorization Type [employer] [facility]    Authorization - Visit Number 8   Authorization - Number of Visits 20   OT Start Time 0800   OT Stop Time 0848   OT Time Calculation (min) 48 min   Activity Tolerance Patient tolerated treatment well      Past Medical History  Diagnosis Date  . Hyperlipidemia   . Diabetes mellitus without complication [facility])     Past Surgical History  Procedure Laterality Date  . Foot surgery    . Cervical fusion    . Knee arthroscopy      There were no vitals filed for this visit.      Subjective Assessment - 11/25/15 0806    Subjective  I see my referring MD June 5th   Patient Stated Goals Get as much use with Lt hand as possible, pick up my grandson   Currently in Pain? No/denies            [facility] OT Assessment - 11/25/15 0001    Left Hand AROM   L Index  MCP 0-90 55 Degrees   L Index PIP 0-100 33 Degrees  - 28 extension   L Long  MCP 0-90 35 Degrees   L Long PIP 0-100 55 Degrees  rest in 50 degrees ext   L Ring  MCP 0-90 80 Degrees   L Ring PIP 0-100 85 Degrees  -20 ext   L Little  MCP 0-90 85 Degrees   L Little PIP 0-100 60 Degrees                  OT Treatments/Exercises (OP) - 11/25/15 0001    ADLs   ADL Comments Verbally reviewed putty HEP. Also discussed visit limit - pt encouraged to call insurance specialist and provided contact info, as well as calling insurance provider for clarification if visit limit is final and per calendar year.    Psychologist, counselling Location volar forearm   Electrical Stimulation Action finger flexors as able (buddy strap around long and ring, and ring and small)   Electrical Stimulation Parameters 50 pss, 250 pw, 10 sec. on/off cycle x 10 min   Electrical Stimulation Goals Neuromuscular facilitation  ROM   Ultrasound   Ultrasound Location palm (base of index and long finger)   Ultrasound Parameters 3 Mhz, continuous, 0.8 wts/cm2, x 8 min   Ultrasound Goals Edema  scar tissue management   Manual Therapy   Joint Mobilization Between metacarpals with noted tightness b/t 2nd and 3rd metacarpal   Passive ROM to all joints of hand at MP's, PIP's isolated and compositely                OT Education - 11/25/15 0851    Education provided Yes   Education Details review of putty HEP, Insurance information/visit limit   Person(s) Educated Patient   Methods Explanation   Comprehension Verbalized understanding          OT Short Term Goals - 11/25/15  0853    OT SHORT TERM GOAL #1   Title Independent with updated HEP PRN (DUE 11/27/15)   Time 4   Period Weeks   Status Achieved   OT SHORT TERM GOAL #2   Title Independent with edema reduction strategies   Time 4   Period Weeks   Status Achieved   OT SHORT TERM GOAL #3   Title Pt to verbalize understanding with potential A/E needs and how to acquire for one handed use to increase independence with ADLS (including for cutting food and tying shoes)    Time 4   Period Weeks   Status Achieved   OT SHORT TERM GOAL #4   Title Pt to demo improved ROM in all remaining digits of Lt hand at MP's and PIP's by 10 degrees or greater   Time 4   Period Weeks   Status Partially Met  met at some joints/digits, not others - see assessment           OT Long Term Goals - 10/28/15 1047    OT LONG TERM GOAL #1   Title Pt to be mod I for all ADLS with compensatory strategies and A/E prn (all LTG's due 12/27/15)   Time 8   Period  Weeks   Status New   OT LONG TERM GOAL #2   Title Pt to verbalize understanding with pain reduction strategies   Time 8   Period Weeks   Status New   OT LONG TERM GOAL #3   Title Pt to increase ROM in all remaining digits Lt hand at MP and PIP joints by 20 degrees from initial evaluation for increased functional use/gripping   Time 8   Period Weeks   Status New   OT LONG TERM GOAL #4   Title Pt to demo 10 lbs or greater grip strength Lt hand to assist with opening jars/containers   Time 8   Period Weeks   Status New               Plan - 11/25/15 4034    Clinical Impression Statement Pt with improved ROM at ring and long finger and partially index finger.    Rehab Potential Fair   Clinical Impairments Affecting Rehab Potential severity of deficits   OT Frequency 2x / week   OT Duration 8 weeks   OT Treatment/Interventions Self-care/ADL training;Therapeutic exercise;Patient/family education;Manual Therapy;Splinting;Therapeutic exercises;Parrafin;DME and/or AE instruction;Compression bandaging;Therapeutic activities;Electrical Stimulation;Fluidtherapy;Scar mobilization;Moist Heat;Contrast Bath;Passive range of motion   OT Home Exercise Plan issued A/ROM, P/ROM, putty   Consulted and Agree with Plan of Care Patient      Patient will benefit from skilled therapeutic intervention in order to improve the following deficits and impairments:  Decreased coordination, Impaired flexibility, Increased edema, Impaired sensation, Decreased skin integrity, Impaired UE functional use, Pain, Decreased strength  Visit Diagnosis: Stiffness of left hand, not elsewhere classified  Other symptoms and signs involving the musculoskeletal system  Localized edema    Problem List Patient Active Problem List   Diagnosis Date Noted  . Prediabetes     Carey Bullocks, OTR/L 11/25/2015, 8:55 AM  [facility] [address] [place], [place], [address] Phone: [phone]   Fax:  [phone]  Name: Todd Lawrence MRN: [id number] Date of Birth: [birth year]

## 2015-11-27 ENCOUNTER — Encounter: Payer: Self-pay | Admitting: Occupational Therapy

## 2015-11-27 ENCOUNTER — Ambulatory Visit: Payer: Medicare Other | Admitting: Occupational Therapy

## 2015-11-27 DIAGNOSIS — M25642 Stiffness of left hand, not elsewhere classified: Secondary | ICD-10-CM

## 2015-11-27 DIAGNOSIS — R600 Localized edema: Secondary | ICD-10-CM

## 2015-11-27 DIAGNOSIS — R29898 Other symptoms and signs involving the musculoskeletal system: Secondary | ICD-10-CM

## 2015-11-27 NOTE — Therapy (Signed)
[name] [address] [place], [place], [address] Phone: [phone]   Fax:  [phone]  Occupational Therapy Treatment  Patient Details  Name: Todd Lawrence MRN: [id number] Date of Birth: 02/08/1951 Referring Provider: Dr. Tressa Busman  Encounter Date: 11/27/2015      OT End of Session - 11/27/15 0842    Visit Number 9   Number of Visits 17   Date for OT Re-Evaluation 12/27/15   Authorization Type [employer] [facility]    Authorization - Visit Number 9   Authorization - Number of Visits 20   OT Start Time 0805   OT Stop Time 0850   OT Time Calculation (min) 45 min   Activity Tolerance Patient tolerated treatment well      Past Medical History  Diagnosis Date  . Hyperlipidemia   . Diabetes mellitus without complication [facility])     Past Surgical History  Procedure Laterality Date  . Foot surgery    . Cervical fusion    . Knee arthroscopy      There were no vitals filed for this visit.      Subjective Assessment - 11/27/15 0806    Patient is accompained by: Family member   Patient Stated Goals Get as much use with Lt hand as possible, pick up my grandson   Currently in Pain? No/denies                      OT Treatments/Exercises (OP) - 11/27/15 0001    Hand Exercises   Other Hand Exercises A/ROM and P/ROM to all digits at MP's and PIP's isolated and compositely - pt unable to actively flex PIP's at index and long finger with MP's blocked   Other Hand Exercises Putty ex's performed from HEP x 10 reps each   Electrical Stimulation   Electrical Stimulation Location volar forearm   Electrical Stimulation Action finger flexors   Electrical Stimulation Parameters 50 pps, 250 pw, 10 sec. on/off x 10 min.    Electrical Stimulation Goals --  ROM    Ultrasound   Ultrasound Location palm (base of index and long finger)   Ultrasound Parameters 3 Mhz, continuous, 0.8 wts/cm2 x 8 min.   Ultrasound Goals --  scar tissue management   Manual Therapy   Joint Mobilization Between metacarpals with noted tightness b/t 2nd and 3rd metacarpal   Passive ROM to all joints of hand at MP's, PIP's isolated and compositely                  OT Short Term Goals - 11/25/15 2774    OT SHORT TERM GOAL #1   Title Independent with updated HEP PRN (DUE 11/27/15)   Time 4   Period Weeks   Status Achieved   OT SHORT TERM GOAL #2   Title Independent with edema reduction strategies   Time 4   Period Weeks   Status Achieved   OT SHORT TERM GOAL #3   Title Pt to verbalize understanding with potential A/E needs and how to acquire for one handed use to increase independence with ADLS (including for cutting food and tying shoes)    Time 4   Period Weeks   Status Achieved   OT SHORT TERM GOAL #4   Title Pt to demo improved ROM in all remaining digits of Lt hand at MP's and PIP's by 10 degrees or greater   Time 4   Period Weeks   Status Partially  Met  met at some joints/digits, not others - see assessment           OT Long Term Goals - 10/28/15 1047    OT LONG TERM GOAL #1   Title Pt to be mod I for all ADLS with compensatory strategies and A/E prn (all LTG's due 12/27/15)   Time 8   Period Weeks   Status New   OT LONG TERM GOAL #2   Title Pt to verbalize understanding with pain reduction strategies   Time 8   Period Weeks   Status New   OT LONG TERM GOAL #3   Title Pt to increase ROM in all remaining digits Lt hand at MP and PIP joints by 20 degrees from initial evaluation for increased functional use/gripping   Time 8   Period Weeks   Status New   OT LONG TERM GOAL #4   Title Pt to demo 10 lbs or greater grip strength Lt hand to assist with opening jars/containers   Time 8   Period Weeks   Status New               Plan - 11/27/15 0843    Clinical Impairments Affecting Rehab Potential severity of deficits   OT Frequency 2x / week   OT Duration 8 weeks    OT Treatment/Interventions Self-care/ADL training;Therapeutic exercise;Patient/family education;Manual Therapy;Splinting;Therapeutic exercises;Parrafin;DME and/or AE instruction;Compression bandaging;Therapeutic activities;Electrical Stimulation;Fluidtherapy;Scar mobilization;Moist Heat;Contrast Bath;Passive range of motion   Plan ? extension splinting for pm, tenodesis splint for during day   OT Home Exercise Plan issued A/ROM, P/ROM, putty   Consulted and Agree with Plan of Care Patient;Family member/caregiver   Family Member Consulted wife      Patient will benefit from skilled therapeutic intervention in order to improve the following deficits and impairments:  Decreased coordination, Impaired flexibility, Increased edema, Impaired sensation, Decreased skin integrity, Impaired UE functional use, Pain, Decreased strength  Visit Diagnosis: Stiffness of left hand, not elsewhere classified  Other symptoms and signs involving the musculoskeletal system  Localized edema    Problem List Patient Active Problem List   Diagnosis Date Noted  . Prediabetes     Carey Bullocks, OTR/L 11/27/2015, 8:44 AM  [place] [address] [place], [place], [address] Phone: [phone]   Fax:  [phone]  Name: Todd Lawrence MRN: [id number] Date of Birth: 29-Mar-1951

## 2015-12-01 ENCOUNTER — Ambulatory Visit: Payer: Medicare Other | Admitting: Occupational Therapy

## 2015-12-01 DIAGNOSIS — M25642 Stiffness of left hand, not elsewhere classified: Secondary | ICD-10-CM | POA: Diagnosis not present

## 2015-12-01 DIAGNOSIS — R29898 Other symptoms and signs involving the musculoskeletal system: Secondary | ICD-10-CM

## 2015-12-01 NOTE — Patient Instructions (Signed)
Wear tenodesis splint 3x day, perform bending and straightening of fingers 25 reps each session. Remove splint and stop wearing, if pressure areas, redness or increased pain.

## 2015-12-01 NOTE — Therapy (Signed)
[place] [address] [place], [place], [address] Phone: [phone]   Fax:  [phone]  Occupational Therapy Treatment  Patient Details  Name: Todd Lawrence MRN: [id number] Date of Birth: 02/25/1951 Referring Provider: Dr. Tressa Busman  Encounter Date: 12/01/2015      OT End of Session - 12/01/15 1308    Visit Number 10   Number of Visits 17   Date for OT Re-Evaluation 12/27/15   Authorization Type [employer] [facility]    Authorization - Visit Number 10   OT Start Time 7371   OT Stop Time 1151   OT Time Calculation (min) 48 min   Activity Tolerance Patient tolerated treatment well   Behavior During Therapy [facility] for tasks assessed/performed      Past Medical History  Diagnosis Date  . Hyperlipidemia   . Diabetes mellitus without complication [facility])     Past Surgical History  Procedure Laterality Date  . Foot surgery    . Cervical fusion    . Knee arthroscopy      There were no vitals filed for this visit.      Subjective Assessment - 12/01/15 1103    Patient is accompained by: Family member   Limitations picc line RUE for antibiotics - no lifting greater than 10 lbs (due to picc line)   Patient Stated Goals Get as much use with Lt hand as possible, pick up my grandson   Currently in Pain? Yes   Pain Score 1    Pain Location Hand   Pain Orientation Right   Pain Descriptors / Indicators Sharp   Pain Type Acute pain   Pain Onset More than a month ago   Pain Frequency Intermittent   Aggravating Factors  P/ROM               Hand Exercises   Other Hand Exercises A/ROM and P/ROM to all digits compositely - PIP flexion to index and middle finger           Pt was fitted with a tenodesis cuff for  index and middle fingers( hook glued to fingernails and gentle tension provided with elastic band to passively  flex digits and then pt performs A/ROM extension, pt returned demonstration x 25. Pt  practiced donning and doffing. Pt verbalized understanding.               Ultrasound   Ultrasound Location palm (base of index and long finger)   Ultrasound Parameters 3 Mhz, continuous, 0.8 wts/cm2 x 8 min.   Ultrasound Goals --  scar tissue management                                                                                 [facility] OT Assessment - 11/25/15 0001     Left Hand AROM     L Index  MCP 0-90  55 Degrees     L Index PIP 0-100  33 Degrees  - 28 extension     L Long  MCP 0-90  35 Degrees     L Long PIP 0-100  55 Degrees  rest in 50 degrees ext     L  Ring  MCP 0-90  80 Degrees     L Ring PIP 0-100  85 Degrees  -20 ext     L Little  MCP 0-90  85 Degrees     L Little PIP 0-100  60 Degrees                       OT Short Term Goals - 11/25/15 0853    OT SHORT TERM GOAL #1   Title Independent with updated HEP PRN (DUE 11/27/15)   Time 4   Period Weeks   Status Achieved   OT SHORT TERM GOAL #2   Title Independent with edema reduction strategies   Time 4   Period Weeks   Status Achieved   OT SHORT TERM GOAL #3   Title Pt to verbalize understanding with potential A/E needs and how to acquire for one handed use to increase independence with ADLS (including for cutting food and tying shoes)    Time 4   Period Weeks   Status Achieved   OT SHORT TERM GOAL #4   Title Pt to demo improved ROM in all remaining digits of Lt hand at MP's and PIP's by 10 degrees or greater   Time 4   Period Weeks   Status Partially Met  met at some joints/digits, not others - see assessment           OT Long Term Goals - 10/28/15 1047    OT LONG TERM GOAL #1   Title Pt to be mod I for all ADLS with compensatory strategies and A/E prn (all LTG's due 12/27/15)   Time 8   Period Weeks   Status New   OT LONG TERM GOAL #2   Title Pt to verbalize understanding with pain reduction strategies   Time 8   Period Weeks   Status New   OT LONG TERM GOAL  #3   Title Pt to increase ROM in all remaining digits Lt hand at MP and PIP joints by 20 degrees from initial evaluation for increased functional use/gripping   Time 8   Period Weeks   Status New   OT LONG TERM GOAL #4   Title Pt to demo 10 lbs or greater grip strength Lt hand to assist with opening jars/containers   Time 8   Period Weeks   Status New               Plan - 12/01/15 1307    Clinical Impression Statement Pt is progressing towards goals with improving overall A/ROM, yet pt continues to demonstrate limitations at long and index finger.   Rehab Potential Fair   Clinical Impairments Affecting Rehab Potential severity of deficits   OT Frequency 2x / week   OT Duration 8 weeks   OT Treatment/Interventions Self-care/ADL training;Therapeutic exercise;Patient/family education;Manual Therapy;Splinting;Therapeutic exercises;Parrafin;DME and/or AE instruction;Compression bandaging;Therapeutic activities;Electrical Stimulation;Fluidtherapy;Scar mobilization;Moist Heat;Contrast Bath;Passive range of motion   Plan check tenodesis cuff, night time splint   OT Home Exercise Plan issued A/ROM, P/ROM, putty   Consulted and Agree with Plan of Care Patient;Family member/caregiver   Family Member Consulted wife      Patient will benefit from skilled therapeutic intervention in order to improve the following deficits and impairments:  Decreased coordination, Impaired flexibility, Increased edema, Impaired sensation, Decreased skin integrity, Impaired UE functional use, Pain, Decreased strength  Visit Diagnosis: Stiffness of left hand, not elsewhere classified  Other symptoms and signs involving the musculoskeletal system  Occupational Therapy Progress Note  Dates of Reporting Period: 10/28/15 to 12/01/15  Objective Reports of Subjective Statement: Pt can benefit from skilled occupational therapy to address decreased coordination, decreased strength, edema, pain which impedes  performance of ADLs/IADLs.  Objective Measurements:  See above  Goal Update:  4/5 short term goals met  Plan: Continue skilled occupational therapy to address long term goals  Reason Skilled Services are Required: see above    Problem List Patient Active Problem List   Diagnosis Date Noted  . Prediabetes     RINE,KATHRYN 12/01/2015, 1:09 PM [name], OTR/L [phone] Phone: [phone] 1:10 PM 12/01/2015     [place] [address] [place], [place], [address] Phone: [phone]   Fax:  [phone]  Name: CHALES PELISSIER MRN: [id number] Date of Birth: 12-06-1950

## 2015-12-04 ENCOUNTER — Encounter: Payer: Self-pay | Admitting: Occupational Therapy

## 2015-12-04 ENCOUNTER — Ambulatory Visit: Payer: Medicare Other | Admitting: Occupational Therapy

## 2015-12-04 DIAGNOSIS — M25642 Stiffness of left hand, not elsewhere classified: Secondary | ICD-10-CM

## 2015-12-04 DIAGNOSIS — R29898 Other symptoms and signs involving the musculoskeletal system: Secondary | ICD-10-CM

## 2015-12-04 NOTE — Therapy (Signed)
[facility] [address] [place], [place], [address] Phone: [phone]   Fax:  [phone]  Occupational Therapy Treatment  Patient Details  Name: Todd Lawrence MRN: [id number] Date of Birth: [birth year] Referring Provider: Dr. Tressa Busman  Encounter Date: 12/04/2015      OT End of Session - 12/04/15 0953    Visit Number 11   Number of Visits 17   Date for OT Re-Evaluation 12/27/15   Authorization Type [employer] [facility]    Authorization - Visit Number 11   Authorization - Number of Visits 20   OT Start Time 0800   OT Stop Time 0845   OT Time Calculation (min) 45 min   Activity Tolerance Patient tolerated treatment well      Past Medical History  Diagnosis Date  . Hyperlipidemia   . Diabetes mellitus without complication [facility])     Past Surgical History  Procedure Laterality Date  . Foot surgery    . Cervical fusion    . Knee arthroscopy      There were no vitals filed for this visit.      Subjective Assessment - 12/04/15 0840    Subjective  The splint feels good   Patient Stated Goals Get as much use with Lt hand as possible, pick up my grandson   Currently in Pain? No/denies                      OT Treatments/Exercises (OP) - 12/04/15 0001    Splinting   Splinting Fabricated and fitted night splint for extension of index and long finger. Issued splint and educated in wear and care. Pt reports tenodesis splint is doing well.                 OT Education - 12/04/15 0844    Education provided Yes   Education Details splint wear and care (pm splint)   Person(s) Educated Patient   Methods Explanation;Handout   Comprehension Verbalized understanding          OT Short Term Goals - 11/25/15 0853    OT SHORT TERM GOAL #1   Title Independent with updated HEP PRN (DUE 11/27/15)   Time 4   Period Weeks   Status Achieved   OT SHORT TERM GOAL #2   Title Independent with  edema reduction strategies   Time 4   Period Weeks   Status Achieved   OT SHORT TERM GOAL #3   Title Pt to verbalize understanding with potential A/E needs and how to acquire for one handed use to increase independence with ADLS (including for cutting food and tying shoes)    Time 4   Period Weeks   Status Achieved   OT SHORT TERM GOAL #4   Title Pt to demo improved ROM in all remaining digits of Lt hand at MP's and PIP's by 10 degrees or greater   Time 4   Period Weeks   Status Partially Met  met at some joints/digits, not others - see assessment           OT Long Term Goals - 10/28/15 1047    OT LONG TERM GOAL #1   Title Pt to be mod I for all ADLS with compensatory strategies and A/E prn (all LTG's due 12/27/15)   Time 8   Period Weeks   Status New   OT LONG TERM GOAL #2   Title Pt to verbalize understanding with pain  reduction strategies   Time 8   Period Weeks   Status New   OT LONG TERM GOAL #3   Title Pt to increase ROM in all remaining digits Lt hand at MP and PIP joints by 20 degrees from initial evaluation for increased functional use/gripping   Time 8   Period Weeks   Status New   OT LONG TERM GOAL #4   Title Pt to demo 10 lbs or greater grip strength Lt hand to assist with opening jars/containers   Time 8   Period Weeks   Status New               Plan - 12/04/15 1415    Clinical Impression Statement Pt tolerating tenodesis splint well.    Rehab Potential Fair   Clinical Impairments Affecting Rehab Potential severity of deficits   OT Frequency 2x / week   OT Duration 8 weeks   OT Treatment/Interventions Self-care/ADL training;Therapeutic exercise;Patient/family education;Manual Therapy;Splinting;Therapeutic exercises;Parrafin;DME and/or AE instruction;Compression bandaging;Therapeutic activities;Electrical Stimulation;Fluidtherapy;Scar mobilization;Moist Heat;Contrast Bath;Passive range of motion   Plan assess night time splint, continue  modalities, ROM   OT Home Exercise Plan issued A/ROM, P/ROM, putty   Consulted and Agree with Plan of Care Patient      Patient will benefit from skilled therapeutic intervention in order to improve the following deficits and impairments:  Decreased coordination, Impaired flexibility, Increased edema, Impaired sensation, Decreased skin integrity, Impaired UE functional use, Pain, Decreased strength  Visit Diagnosis: Stiffness of left hand, not elsewhere classified  Other symptoms and signs involving the musculoskeletal system    Problem List Patient Active Problem List   Diagnosis Date Noted  . Prediabetes     Carey Bullocks, OTR/L 12/04/2015, 10:27 AM  [facility] [address] [place], [place], [address] Phone: [phone]   Fax:  [phone]  Name: Todd Lawrence MRN: [id number] Date of Birth: 07/10/1951

## 2015-12-04 NOTE — Patient Instructions (Signed)
Your Splint This splint should initially be fitted by a healthcare practitioner.  The healthcare practitioner is responsible for providing wearing instructions and precautions to the patient, other healthcare practitioners and care provider involved in the patient's care.  This splint was custom made for you. Please read the following instructions to learn about wearing and caring for your splint.  Precautions Should your splint cause any of the following problems, remove the splint immediately and contact your therapist/physician.  Swelling  Severe Pain  Pressure Areas  Stiffness  Numbness  Do not wear your splint while operating machinery unless it has been fabricated for that purpose.  When To Wear Your Splint Where your splint according to your therapist/physician instructions. Night time  Care and Cleaning of Your Splint 1. Keep your splint away from open flames. 2. Your splint will lose its shape in temperatures over 135 degrees Farenheit, ( in car windows, near radiators, ovens or in hot water).  Never make any adjustments to your splint, if the splint needs adjusting remove it and make an appointment to see your therapist. 3. Your splint, including the cushion liner may be cleaned with rubbing alcohol.   Do not immerse in hot water over 135 degrees Farenheit. 4. Straps may be washed with soap and water, but do not moisten the self-adhesive portion.

## 2015-12-12 ENCOUNTER — Ambulatory Visit: Payer: Medicare Other | Attending: Plastic Surgery | Admitting: Occupational Therapy

## 2015-12-12 DIAGNOSIS — R29898 Other symptoms and signs involving the musculoskeletal system: Secondary | ICD-10-CM | POA: Insufficient documentation

## 2015-12-12 DIAGNOSIS — R600 Localized edema: Secondary | ICD-10-CM | POA: Insufficient documentation

## 2015-12-12 DIAGNOSIS — R208 Other disturbances of skin sensation: Secondary | ICD-10-CM | POA: Insufficient documentation

## 2015-12-12 DIAGNOSIS — M25542 Pain in joints of left hand: Secondary | ICD-10-CM | POA: Insufficient documentation

## 2015-12-12 DIAGNOSIS — M25642 Stiffness of left hand, not elsewhere classified: Secondary | ICD-10-CM | POA: Insufficient documentation

## 2015-12-12 NOTE — Therapy (Signed)
[facility] [address] [place], [place], [address] Phone: [phone]   Fax:  [phone]  Occupational Therapy Treatment  Patient Details  Name: Todd Lawrence MRN: [id number] Date of Birth: August 24, 1950 Referring Provider: Dr. Tressa Busman  Encounter Date: 12/12/2015      OT End of Session - 12/12/15 1554    Visit Number 12   Number of Visits 17   Date for OT Re-Evaluation 12/27/15   Authorization Type [facility] [facility]    Authorization - Visit Number 12   Authorization - Number of Visits 20   OT Start Time 8016   OT Stop Time 1145   OT Time Calculation (min) 40 min   Activity Tolerance Patient tolerated treatment well   Behavior During Therapy [facility] for tasks assessed/performed      Past Medical History  Diagnosis Date  . Hyperlipidemia   . Diabetes mellitus without complication [facility])     Past Surgical History  Procedure Laterality Date  . Foot surgery    . Cervical fusion    . Knee arthroscopy      There were no vitals filed for this visit.      Subjective Assessment - 12/12/15 1119    Subjective  Pt reports splint is doing well overall.   Limitations  no lifting greater than 10 lbs    Currently in Pain? No/denies             Fluidotherapy x 10 mins while therapist performed minor adjustments to night time splint for comfort., no adverse reactions. Therapist checked night time splint fit and it is fitting well. Therapist checked tenodesis cuff and tightened elastic bands. A/ROM and P/ROM composite flexion/ extension, P/ROM to PIP joints of index and middle. Note sent to MD via pt. Ultrasound 56mz, 0.8w/cm2, 50%, x8 mins, to left palm at base of index and middle finger for scar mobilization.                   OT Short Term Goals - 11/25/15 0853    OT SHORT TERM GOAL #1   Title Independent with updated HEP PRN (DUE 11/27/15)   Time 4   Period Weeks   Status Achieved   OT SHORT TERM GOAL #2   Title Independent with edema reduction strategies   Time 4   Period Weeks   Status Achieved   OT SHORT TERM GOAL #3   Title Pt to verbalize understanding with potential A/E needs and how to acquire for one handed use to increase independence with ADLS (including for cutting food and tying shoes)    Time 4   Period Weeks   Status Achieved   OT SHORT TERM GOAL #4   Title Pt to demo improved ROM in all remaining digits of Lt hand at MP's and PIP's by 10 degrees or greater   Time 4   Period Weeks   Status Partially Met  met at some joints/digits, not others - see assessment           OT Long Term Goals - 10/28/15 1047    OT LONG TERM GOAL #1   Title Pt to be mod I for all ADLS with compensatory strategies and A/E prn (all LTG's due 12/27/15)   Time 8   Period Weeks   Status New   OT LONG TERM GOAL #2   Title Pt to verbalize understanding with pain reduction strategies   Time 8   Period Weeks  Status New   OT LONG TERM GOAL #3   Title Pt to increase ROM in all remaining digits Lt hand at MP and PIP joints by 20 degrees from initial evaluation for increased functional use/gripping   Time 8   Period Weeks   Status New   OT LONG TERM GOAL #4   Title Pt to demo 10 lbs or greater grip strength Lt hand to assist with opening jars/containers   Time 8   Period Weeks   Status New               Plan - 12/12/15 1553    Clinical Impression Statement Pt is progressing slowly towards goals. Note sent to MD regarding progress as pt sees him Monday.   Rehab Potential Fair   Clinical Impairments Affecting Rehab Potential severity of deficits   OT Frequency 2x / week   OT Duration 8 weeks   OT Treatment/Interventions Self-care/ADL training;Therapeutic exercise;Patient/family education;Manual Therapy;Splinting;Therapeutic exercises;Parrafin;DME and/or AE instruction;Compression bandaging;Therapeutic activities;Electrical Stimulation;Fluidtherapy;Scar  mobilization;Moist Heat;Contrast Bath;Passive range of motion   Plan Discuss MD visit and plans to place therapy on hold (vs. Schedule more appointments), modalities, ROM   OT Home Exercise Plan issued A/ROM, P/ROM, putty   Consulted and Agree with Plan of Care Patient      Patient will benefit from skilled therapeutic intervention in order to improve the following deficits and impairments:  Decreased coordination, Impaired flexibility, Increased edema, Impaired sensation, Decreased skin integrity, Impaired UE functional use, Pain, Decreased strength  Visit Diagnosis: Stiffness of left hand, not elsewhere classified  Other symptoms and signs involving the musculoskeletal system  Localized edema  Pain in joints of left hand    Problem List Patient Active Problem List   Diagnosis Date Noted  . Prediabetes     Tamyrah Burbage 12/12/2015, 3:55 PM [name], OTR/L [phone] Phone: [phone] 4:03 PM 12/12/2015 [facility] [address] [place], [place], [address] Phone: [phone]   Fax:  [phone]  Name: Todd Lawrence MRN: [id number] Date of Birth: September 17, 1950

## 2015-12-23 ENCOUNTER — Ambulatory Visit: Payer: Medicare Other | Admitting: Occupational Therapy

## 2015-12-23 ENCOUNTER — Encounter: Payer: Self-pay | Admitting: Occupational Therapy

## 2015-12-23 DIAGNOSIS — R29898 Other symptoms and signs involving the musculoskeletal system: Secondary | ICD-10-CM

## 2015-12-23 DIAGNOSIS — M25642 Stiffness of left hand, not elsewhere classified: Secondary | ICD-10-CM

## 2015-12-23 DIAGNOSIS — M25542 Pain in joints of left hand: Secondary | ICD-10-CM

## 2015-12-23 NOTE — Therapy (Signed)
[facility] [address] [place], [place], [address] Phone: [phone]   Fax:  [phone]  Occupational Therapy Treatment  Patient Details  Name: NEWELL WAFER MRN: [id number] Date of Birth: February 08, 1951 Referring Provider: Dr. Tressa Busman  Encounter Date: 12/23/2015      OT End of Session - 12/23/15 1219    Visit Number 13   Number of Visits 17   Date for OT Re-Evaluation 12/27/15   Authorization Type [facility] [facility]    Authorization Time Period week 7/8  (week of 12/23/15)   OT Start Time 0930   OT Stop Time 1030   OT Time Calculation (min) 60 min   Activity Tolerance Patient tolerated treatment well      Past Medical History  Diagnosis Date  . Hyperlipidemia   . Diabetes mellitus without complication [facility])     Past Surgical History  Procedure Laterality Date  . Foot surgery    . Cervical fusion    . Knee arthroscopy      There were no vitals filed for this visit.      Subjective Assessment - 12/23/15 0938    Subjective  I saw the Dr. again. He said I'd only get 15-30% more motion if he want back in and did surgery (Re: index and long finger)    Limitations  no lifting greater than 10 lbs    Patient Stated Goals Get as much use with Lt hand as possible, pick up my grandson   Currently in Pain? No/denies                      OT Treatments/Exercises (OP) - 12/23/15 0001    ADLs   ADL Comments Discussed MD visit and pt reports if surgeon did surgery for index and long finger, he anticipated only 15-30% more motion. Therapist encouraged pt to discuss with MD at next visit options for thumb - considering taking part of a toe to attach onto thumb for increased length and more function. Also, verbally reviewed putty exercises and importance of continuing. Discussed bringing in flexion glove next visit to adjust tension prn - pt agreed. Also discussed possible options for holding fishing  rod - will explore further in later visits   Modalities   Modalities Fluidotherapy   LUE Fluidotherapy   Number Minutes Fluidotherapy 12 Minutes   LUE Fluidotherapy Location Hand;Wrist   Comments at end of session   Manual Therapy   Passive ROM to all joints of hand at MP's, PIP's isolated and compositely, followed by place and hold ex's as able.                   OT Short Term Goals - 11/25/15 0853    OT SHORT TERM GOAL #1   Title Independent with updated HEP PRN (DUE 11/27/15)   Time 4   Period Weeks   Status Achieved   OT SHORT TERM GOAL #2   Title Independent with edema reduction strategies   Time 4   Period Weeks   Status Achieved   OT SHORT TERM GOAL #3   Title Pt to verbalize understanding with potential A/E needs and how to acquire for one handed use to increase independence with ADLS (including for cutting food and tying shoes)    Time 4   Period Weeks   Status Achieved   OT SHORT TERM GOAL #4   Title Pt to demo improved ROM in all remaining digits  of Lt hand at MP's and PIP's by 10 degrees or greater   Time 4   Period Weeks   Status Partially Met  met at some joints/digits, not others - see assessment           OT Long Term Goals - 10/28/15 1047    OT LONG TERM GOAL #1   Title Pt to be mod I for all ADLS with compensatory strategies and A/E prn (all LTG's due 12/27/15)   Time 8   Period Weeks   Status New   OT LONG TERM GOAL #2   Title Pt to verbalize understanding with pain reduction strategies   Time 8   Period Weeks   Status New   OT LONG TERM GOAL #3   Title Pt to increase ROM in all remaining digits Lt hand at MP and PIP joints by 20 degrees from initial evaluation for increased functional use/gripping   Time 8   Period Weeks   Status New   OT LONG TERM GOAL #4   Title Pt to demo 10 lbs or greater grip strength Lt hand to assist with opening jars/containers   Time 8   Period Weeks   Status New               Plan - 12/23/15  1220    Clinical Impression Statement Pt has since seen MD with 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 Pt does appear to have increased P/ROM but A/ROM remains limited at index and long finger.    Rehab Potential Fair   Clinical Impairments Affecting Rehab Potential severity of deficits   OT Frequency 2x / week   OT Duration 8 weeks   OT Treatment/Interventions Self-care/ADL training;Therapeutic exercise;Patient/family education;Manual Therapy;Splinting;Therapeutic exercises;Parrafin;DME and/or AE instruction;Compression bandaging;Therapeutic activities;Electrical Stimulation;Fluidtherapy;Scar mobilization;Moist Heat;Contrast Bath;Passive range of motion   Plan fluidotherapy, adjust flexion glove prn, continue P/ROM, putty, begin assessing LTG's (pt to return week of 01/05/16, then place on hold until after vacation - will renew for 1x/wk for 8 weeks when pt returns in July)   [place] issued A/ROM, P/ROM, putty   Consulted and Agree with Plan of Care Patient;Family member/caregiver   Family Member Consulted wife      Patient will benefit from skilled therapeutic intervention in order to improve the following deficits and impairments:  Decreased coordination, Impaired flexibility, Increased edema, Impaired sensation, Decreased skin integrity, Impaired UE functional use, Pain, Decreased strength  Visit Diagnosis: Stiffness of left hand, not elsewhere classified  Other symptoms and signs involving the musculoskeletal system  Pain in joints of left hand    Problem List Patient Active Problem List   Diagnosis Date Noted  . Prediabetes     Carey Bullocks, OTR/L 12/23/2015, 12:26 PM  [facility] [address] [place], [place], [address] Phone: [phone]   Fax:  [phone]  Name: TARIQ PERNELL MRN: [id number] Date of Birth: 1950-09-29

## 2015-12-25 ENCOUNTER — Encounter: Payer: Self-pay | Admitting: Occupational Therapy

## 2015-12-25 ENCOUNTER — Ambulatory Visit: Payer: Medicare Other | Admitting: Occupational Therapy

## 2015-12-25 DIAGNOSIS — R29898 Other symptoms and signs involving the musculoskeletal system: Secondary | ICD-10-CM

## 2015-12-25 DIAGNOSIS — M25642 Stiffness of left hand, not elsewhere classified: Secondary | ICD-10-CM

## 2015-12-25 NOTE — Therapy (Signed)
[facility] [address] [place], [place], [address] Phone: [phone]   Fax:  [phone]  Occupational Therapy Treatment  Patient Details  Name: Todd Lawrence MRN: [id number] Date of Birth: [birth year] Referring Provider: Dr. Tressa Busman  Encounter Date: 12/25/2015      OT End of Session - 12/25/15 1039    Visit Number 14   Number of Visits 17   Date for OT Re-Evaluation 12/27/15   Authorization Type [facility] [facility]    Authorization Time Period week 7/8  (week of 12/23/15)   OT Start Time 0930   OT Stop Time 1015   OT Time Calculation (min) 45 min   Activity Tolerance Patient tolerated treatment well      Past Medical History  Diagnosis Date  . Hyperlipidemia   . Diabetes mellitus without complication [facility])     Past Surgical History  Procedure Laterality Date  . Foot surgery    . Cervical fusion    . Knee arthroscopy      There were no vitals filed for this visit.      Subjective Assessment - 12/25/15 0949    Patient is accompained by: Family member   Limitations  no lifting greater than 10 lbs    Patient Stated Goals Get as much use with Lt hand as possible, pick up my grandson   Currently in Pain? No/denies            [facility] OT Assessment - 12/25/15 0001    Left Hand AROM   L Index  MCP 0-90 40 Degrees  -10 ext   L Index PIP 0-100 --  25 to 35 degrees motion   L Long  MCP 0-90 32 Degrees  0 ext   L Long PIP 0-100 --  49 to 53 degrees motion   L Ring  MCP 0-90 --  85   L Ring PIP 0-100 90 Degrees  -15   L Little  MCP 0-90 --  WFL's   L Little PIP 0-100 75 Degrees  -5   Hand Function   Left Hand Grip (lbs) 19 LBS                  OT Treatments/Exercises (OP) - 12/25/15 0001    ADLs   ADL Comments Assessed LTG's and progress to date (see assessment with latest ROM measurements and grip strength). Also assessed tension on flexion glove for all fngers Lt hand -  tension still appropriate, no adjustments needed at this time   LUE Fluidotherapy   Number Minutes Fluidotherapy 12 Minutes   LUE Fluidotherapy Location Hand;Wrist   Comments at beginning of session to decrease stiffness   Manual Therapy   Manual Therapy Passive ROM   Passive ROM to all joints of hand at MP's, PIP's isolated and compositely, followed by place and hold ex's as able.                   OT Short Term Goals - 11/25/15 0853    OT SHORT TERM GOAL #1   Title Independent with updated HEP PRN (DUE 11/27/15)   Time 4   Period Weeks   Status Achieved   OT SHORT TERM GOAL #2   Title Independent with edema reduction strategies   Time 4   Period Weeks   Status Achieved   OT SHORT TERM GOAL #3   Title Pt to verbalize understanding with potential A/E needs and how to acquire for  one handed use to increase independence with ADLS (including for cutting food and tying shoes)    Time 4   Period Weeks   Status Achieved   OT SHORT TERM GOAL #4   Title Pt to demo improved ROM in all remaining digits of Lt hand at MP's and PIP's by 10 degrees or greater   Time 4   Period Weeks   Status Partially Met  met at some joints/digits, not others - see assessment           OT Long Term Goals - 12/25/15 1041    OT LONG TERM GOAL #1   Title Pt to be mod I for all ADLS with compensatory strategies and A/E prn (all LTG's due 12/27/15)   Time 8   Period Weeks   Status Achieved   OT LONG TERM GOAL #2   Title Pt to verbalize understanding with pain reduction strategies   Time 8   Period Weeks   Status Achieved   OT LONG TERM GOAL #3   Title Pt to increase ROM in all remaining digits Lt hand at MP and PIP joints by 20 degrees from initial evaluation for increased functional use/gripping   Time 8   Period Weeks   Status Partially Met  met at ring and small finger, not met at index and long finger    OT LONG TERM GOAL #4   Title Pt to demo 10 lbs or greater grip strength Lt hand  to assist with opening jars/containers   Time 8   Period Weeks   Status Achieved  19 lbs               Plan - 12/25/15 1042    Clinical Impression Statement Pt has met all LTG's except ROM for index and long finger. However pt has increased ROM at MP joints of index and long fingers; PIP joints remain limited. Pt has also increased grip strength Lt hand and become mod I for BADLS   Rehab Potential Fair   Clinical Impairments Affecting Rehab Potential severity of deficits   OT Frequency 2x / week   OT Duration 8 weeks   OT Treatment/Interventions Self-care/ADL training;Therapeutic exercise;Patient/family education;Manual Therapy;Splinting;Therapeutic exercises;Parrafin;DME and/or AE instruction;Compression bandaging;Therapeutic activities;Electrical Stimulation;Fluidtherapy;Scar mobilization;Moist Heat;Contrast Bath;Passive range of motion   Plan Continue fluidotherapy, putty ex's, P/ROM (pt will then return in July for renewal at 1x/wk for 6-8 weeks)    OT Home Exercise Plan issued A/ROM, P/ROM, putty   Consulted and Agree with Plan of Care Patient;Family member/caregiver   Family Member Consulted wife      Patient will benefit from skilled therapeutic intervention in order to improve the following deficits and impairments:  Decreased coordination, Impaired flexibility, Increased edema, Impaired sensation, Decreased skin integrity, Impaired UE functional use, Pain, Decreased strength  Visit Diagnosis: Stiffness of left hand, not elsewhere classified  Other symptoms and signs involving the musculoskeletal system    Problem List Patient Active Problem List   Diagnosis Date Noted  . Prediabetes     Carey Bullocks, OTR/L 12/25/2015, 10:47 AM  [facility] [address] [place], [place], [address] Phone: [phone]   Fax:  [phone]  Name: DRAYLEN LOBUE MRN: [id number] Date of Birth: [birth year]

## 2016-01-04 ENCOUNTER — Other Ambulatory Visit: Payer: Self-pay | Admitting: Family Medicine

## 2016-01-07 ENCOUNTER — Ambulatory Visit: Payer: Medicare Other | Admitting: Occupational Therapy

## 2016-01-07 DIAGNOSIS — R29898 Other symptoms and signs involving the musculoskeletal system: Secondary | ICD-10-CM

## 2016-01-07 DIAGNOSIS — M25642 Stiffness of left hand, not elsewhere classified: Secondary | ICD-10-CM

## 2016-01-07 DIAGNOSIS — R600 Localized edema: Secondary | ICD-10-CM

## 2016-01-07 DIAGNOSIS — R208 Other disturbances of skin sensation: Secondary | ICD-10-CM

## 2016-01-07 DIAGNOSIS — M25542 Pain in joints of left hand: Secondary | ICD-10-CM

## 2016-01-07 NOTE — Therapy (Signed)
[facility] [address] [place], [place], [address] Phone: [phone]   Fax:  [phone]  Occupational Therapy Treatment  Patient Details  Name: Todd Lawrence MRN: [id number] Date of Birth: 02-16-1951 Referring Provider: Dr. Tressa Busman  Encounter Date: 01/07/2016      OT End of Session - 01/07/16 0943    Visit Number 15   Number of Visits 17   Date for OT Re-Evaluation 12/27/15   Authorization Type [facility] [facility]    Authorization Time Period week 8/8  (week of 12/23/15)   Authorization - Visit Number 15   Authorization - Number of Visits 20   OT Start Time [phone]   OT Stop Time 1015   OT Time Calculation (min) 48 min   Activity Tolerance Patient tolerated treatment well   Behavior During Therapy [facility] for tasks assessed/performed      Past Medical History  Diagnosis Date  . Hyperlipidemia   . Diabetes mellitus without complication [facility])     Past Surgical History  Procedure Laterality Date  . Foot surgery    . Cervical fusion    . Knee arthroscopy      There were no vitals filed for this visit.      Subjective Assessment - 01/07/16 0938    Subjective  Pt agrees with plans to place therapy on hold after today until July   Limitations  no lifting greater than 10 lbs    Patient Stated Goals Get as much use with Lt hand as possible, pick up my grandson   Currently in Pain? No/denies        LUE Fluidotherapy  Number Minutes Fluidotherapy 10 Minutes  LUE Fluidotherapy Location Hand;Wrist  Comments at beginning of session to decrease stiffness  Manual Therapy  Manual Therapy Passive ROM  Passive ROM to all joints of hand at MP's, PIP's isolated and compositely, followed by place and hold ex's as able.     Upgraded putty exercises to red, pt returned demonstration. Red digi flex for small and ring fingers, digi extend with therapist holding in place. Therapist replaced rubberbands on flexion  glove and assessed fit. Pt was shown active hands flexion assist glove to consider for fishing. Pt to purchase fishing pole holder belt.                       OT Short Term Goals - 11/25/15 0853    OT SHORT TERM GOAL #1   Title Independent with updated HEP PRN (DUE 11/27/15)   Time 4   Period Weeks   Status Achieved   OT SHORT TERM GOAL #2   Title Independent with edema reduction strategies   Time 4   Period Weeks   Status Achieved   OT SHORT TERM GOAL #3   Title Pt to verbalize understanding with potential A/E needs and how to acquire for one handed use to increase independence with ADLS (including for cutting food and tying shoes)    Time 4   Period Weeks   Status Achieved   OT SHORT TERM GOAL #4   Title Pt to demo improved ROM in all remaining digits of Lt hand at MP's and PIP's by 10 degrees or greater   Time 4   Period Weeks   Status Partially Met  met at some joints/digits, not others - see assessment           OT Long Term Goals - 12/25/15 1041  OT LONG TERM GOAL #1   Title Pt to be mod I for all ADLS with compensatory strategies and A/E prn (all LTG's due 12/27/15)   Time 8   Period Weeks   Status Achieved   OT LONG TERM GOAL #2   Title Pt to verbalize understanding with pain reduction strategies   Time 8   Period Weeks   Status Achieved   OT LONG TERM GOAL #3   Title Pt to increase ROM in all remaining digits Lt hand at MP and PIP joints by 20 degrees from initial evaluation for increased functional use/gripping   Time 8   Period Weeks   Status Partially Met  met at ring and small finger, not met at index and long finger    OT LONG TERM GOAL #4   Title Pt to demo 10 lbs or greater grip strength Lt hand to assist with opening jars/containers   Time 8   Period Weeks   Status Achieved  19 lbs               Plan - 01/07/16 0939    Clinical Impression Statement Pt is progressing towards goals. Pt agrees with plans to place  therapy on hold until July.   Rehab Potential Fair   OT Frequency 2x / week   OT Duration 8 weeks   OT Treatment/Interventions Self-care/ADL training;Therapeutic exercise;Patient/family education;Manual Therapy;Splinting;Therapeutic exercises;Parrafin;DME and/or AE instruction;Compression bandaging;Therapeutic activities;Electrical Stimulation;Fluidtherapy;Scar mobilization;Moist Heat;Contrast Bath;Passive range of motion   Plan Place therapy on hold until July   OT Home Exercise Plan issued A/ROM, P/ROM, putty   Consulted and Agree with Plan of Care Patient;Family member/caregiver   Family Member Consulted wife      Patient will benefit from skilled therapeutic intervention in order to improve the following deficits and impairments:  Decreased coordination, Impaired flexibility, Increased edema, Impaired sensation, Decreased skin integrity, Impaired UE functional use, Pain, Decreased strength  Visit Diagnosis: Stiffness of left hand, not elsewhere classified  Other symptoms and signs involving the musculoskeletal system  Pain in joints of left hand  Localized edema  Other disturbances of skin sensation    Problem List Patient Active Problem List   Diagnosis Date Noted  . Prediabetes     Azarias Chiou 01/07/2016, 9:58 AM [name], OTR/L [phone] Phone: [phone] 9:58 AM 01/07/2016 [place] [address] [place], [place], [address] Phone: [phone]   Fax:  [phone]  Name: HARVEER SADLER MRN: [id number] Date of Birth: 08/24/1950

## 2016-01-09 ENCOUNTER — Encounter: Payer: Medicare Other | Admitting: Occupational Therapy

## 2016-01-27 ENCOUNTER — Encounter: Payer: Self-pay | Admitting: Occupational Therapy

## 2016-01-27 ENCOUNTER — Ambulatory Visit: Payer: Medicare Other | Attending: Plastic Surgery | Admitting: Occupational Therapy

## 2016-01-27 DIAGNOSIS — M25642 Stiffness of left hand, not elsewhere classified: Secondary | ICD-10-CM | POA: Diagnosis present

## 2016-01-27 DIAGNOSIS — R600 Localized edema: Secondary | ICD-10-CM | POA: Diagnosis present

## 2016-01-27 DIAGNOSIS — R29898 Other symptoms and signs involving the musculoskeletal system: Secondary | ICD-10-CM | POA: Diagnosis present

## 2016-01-27 DIAGNOSIS — M25542 Pain in joints of left hand: Secondary | ICD-10-CM | POA: Diagnosis present

## 2016-01-27 DIAGNOSIS — R208 Other disturbances of skin sensation: Secondary | ICD-10-CM | POA: Insufficient documentation

## 2016-01-27 NOTE — Therapy (Signed)
[facility] [address] [place], [place], [address] Phone: [phone]   Fax:  [phone]  Occupational Therapy Treatment  Patient Details  Name: Todd Lawrence MRN: [id number] Date of Birth: 07/18/1950 Referring Provider: Dr. Tressa Busman  Encounter Date: 01/27/2016      OT End of Session - 01/27/16 1040    Visit Number 16   Number of Visits 23   Date for OT Re-Evaluation 03/27/16   Authorization Type [facility] [facility]    Authorization Time Period week 1/8 as of 01/27/16   Authorization - Visit Number 16   Authorization - Number of Visits 20   OT Start Time 0850   OT Stop Time 0930   OT Time Calculation (min) 40 min   Equipment Utilized During Treatment paraffin   Activity Tolerance Patient tolerated treatment well      Past Medical History  Diagnosis Date  . Hyperlipidemia   . Diabetes mellitus without complication [facility])     Past Surgical History  Procedure Laterality Date  . Foot surgery    . Cervical fusion    . Knee arthroscopy      There were no vitals filed for this visit.      Subjective Assessment - 01/27/16 0856    Subjective  These 2 fingers are about the same   Patient is accompained by: Family member   Patient Stated Goals Get as much use with Lt hand as possible, pick up my grandson   Currently in Pain? No/denies            [facility] OT Assessment - 01/27/16 0001    Left Hand AROM   L Index  MCP 0-90 45 Degrees  -25 ext   L Index PIP 0-100 30 Degrees  no ext   L Long  MCP 0-90 35 Degrees  0   L Long PIP 0-100 --  50 to 53*, no ext   L Ring PIP 0-100 95 Degrees  MP blocked in ext   L Little PIP 0-100 85 Degrees  MP blocked in ext   Hand Function   Left Hand Grip (lbs) 25 lbs                  OT Treatments/Exercises (OP) - 01/27/16 0001    ADLs   ADL Comments Reassessed grip strength, ROM, and discussed POC/goals for this renewal period   Modalities   Modalities Paraffin   LUE Paraffin   Number Minutes Paraffin 10 Minutes   LUE Paraffin Location Hand;Wrist   Comments to decrease stiffness   Manual Therapy   Passive ROM to all joints of hand at MP's, PIP's isolated and compositely, followed by place and hold ex's as able.                   OT Short Term Goals - 11/25/15 0853    OT SHORT TERM GOAL #1   Title Independent with updated HEP PRN (DUE 11/27/15)   Time 4   Period Weeks   Status Achieved   OT SHORT TERM GOAL #2   Title Independent with edema reduction strategies   Time 4   Period Weeks   Status Achieved   OT SHORT TERM GOAL #3   Title Pt to verbalize understanding with potential A/E needs and how to acquire for one handed use to increase independence with ADLS (including for cutting food and tying shoes)    Time 4   Period Weeks  Status Achieved   OT SHORT TERM GOAL #4   Title Pt to demo improved ROM in all remaining digits of Lt hand at MP's and PIP's by 10 degrees or greater   Time 4   Period Weeks   Status Partially Met  met at some joints/digits, not others - see assessment           OT Long Term Goals - 01/27/16 1044    OT LONG TERM GOAL #1   Title Independent with updated HEP (All LTG's due 03/27/16)    Time 8   Period Weeks   Status New   OT LONG TERM GOAL #2   Title Grip strength to be 30 lbs or greater Lt hand to assist with opening jars/containers   Baseline renewal = 25 lbs   Time 8   Period Weeks   Status New   OT LONG TERM GOAL #3   Title Pt to increase 4th and 5th digit PIP flexion by 10* for better gripping   Baseline renewal: 4th = 95*, 5th = 85* (with MP's blocked in ext)    Time 8   Period Weeks   Status New   OT LONG TERM GOAL #4   Title Pt to improve MP flexion of index and long finger by 5* or greater for better  gripping   Baseline renewal: index = 47*, long = 35*   Time 8   Period Weeks   Status New               Plan - 01/27/16 1047    Clinical  Impression Statement Pt seen for renewal today. Pt had met all original LTG's, therefore updated all LTG's for renewal period.    Rehab Potential Fair   Clinical Impairments Affecting Rehab Potential severity of deficits   OT Frequency 1x / week   OT Duration 8 weeks   OT Treatment/Interventions Self-care/ADL training;Therapeutic exercise;Patient/family education;Manual Therapy;Splinting;Therapeutic exercises;Parrafin;DME and/or AE instruction;Compression bandaging;Therapeutic activities;Electrical Stimulation;Fluidtherapy;Scar mobilization;Moist Heat;Contrast Bath;Passive range of motion   Plan joint mobs, P/ROM and A/ROM   OT Home Exercise Plan issued A/ROM, P/ROM, putty   Consulted and Agree with Plan of Care Patient      Patient will benefit from skilled therapeutic intervention in order to improve the following deficits and impairments:  Decreased coordination, Impaired flexibility, Increased edema, Impaired sensation, Decreased skin integrity, Impaired UE functional use, Pain, Decreased strength  Visit Diagnosis: Stiffness of left hand, not elsewhere classified - Plan: Ot plan of care cert/re-cert  Other symptoms and signs involving the musculoskeletal system - Plan: Ot plan of care cert/re-cert  Pain in joints of left hand - Plan: Ot plan of care cert/re-cert  Localized edema - Plan: Ot plan of care cert/re-cert  Other disturbances of skin sensation - Plan: Ot plan of care cert/re-cert    Problem List Patient Active Problem List   Diagnosis Date Noted  . Prediabetes     Carey Bullocks, OTR/L 01/27/2016, 10:51 AM  [place] [address] [place], [place], [address] Phone: [phone]   Fax:  [phone]  Name: Todd Lawrence MRN: [id number] Date of Birth: 02-28-1951

## 2016-02-03 ENCOUNTER — Ambulatory Visit: Payer: Medicare Other | Admitting: Occupational Therapy

## 2016-02-03 DIAGNOSIS — R29898 Other symptoms and signs involving the musculoskeletal system: Secondary | ICD-10-CM

## 2016-02-03 DIAGNOSIS — M25642 Stiffness of left hand, not elsewhere classified: Secondary | ICD-10-CM

## 2016-02-03 DIAGNOSIS — M25542 Pain in joints of left hand: Secondary | ICD-10-CM

## 2016-02-03 DIAGNOSIS — R600 Localized edema: Secondary | ICD-10-CM

## 2016-02-03 NOTE — Therapy (Signed)
[place] [address] [place], [place], [address] Phone: [phone]   Fax:  [phone]  Occupational Therapy Treatment  Patient Details  Name: Todd Lawrence MRN: [id number] Date of Birth: 02/19/1951 Referring Provider: Dr. Tressa Busman  Encounter Date: 02/03/2016      OT End of Session - 02/03/16 0914    Visit Number 17   Number of Visits 23   Date for OT Re-Evaluation 03/27/16   Authorization Type [employer] [facility]    Authorization Time Period week 2/8   Authorization - Visit Number 28   Authorization - Number of Visits 20   OT Start Time 0848   OT Stop Time 0930   OT Time Calculation (min) 42 min   Equipment Utilized During Treatment paraffin   Activity Tolerance Patient tolerated treatment well   Behavior During Therapy [facility] for tasks assessed/performed      Past Medical History:  Diagnosis Date  . Diabetes mellitus without complication ([place])   . Hyperlipidemia     Past Surgical History:  Procedure Laterality Date  . CERVICAL FUSION    . FOOT SURGERY    . KNEE ARTHROSCOPY      There were no vitals filed for this visit.      Subjective Assessment - 02/03/16 0854    Subjective  denies pain   Patient Stated Goals Get as much use with Lt hand as possible, pick up my grandson   Currently in Pain? No/denies               LUE Paraffin  Number Minutes Paraffin 10 Minutes  LUE Paraffin Location Hand;Wrist  Comments to decrease stiffness  Manual Therapy  Passive ROM to all joints of hand at MP's, PIP's isolated and compositely, followed by place and hold ex's as able.    Red putty for composite grip with ring and small finger, then individual ring and small and finger adduction Removing /placing velco dowel pegs with ring and small fingers for increased fine motor coordination. Pt practiced using ring and small fingers to hold needle nosed pliers to loosen a  bolt.                OT Short Term Goals - 11/25/15 0853      OT SHORT TERM GOAL #1   Title Independent with updated HEP PRN (DUE 11/27/15)   Time 4   Period Weeks   Status Achieved     OT SHORT TERM GOAL #2   Title Independent with edema reduction strategies   Time 4   Period Weeks   Status Achieved     OT SHORT TERM GOAL #3   Title Pt to verbalize understanding with potential A/E needs and how to acquire for one handed use to increase independence with ADLS (including for cutting food and tying shoes)    Time 4   Period Weeks   Status Achieved     OT SHORT TERM GOAL #4   Title Pt to demo improved ROM in all remaining digits of Lt hand at MP's and PIP's by 10 degrees or greater   Time 4   Period Weeks   Status Partially Met  met at some joints/digits, not others - see assessment           OT Long Term Goals - 01/27/16 1044      OT LONG TERM GOAL #1   Title Independent with updated HEP (All LTG's due 03/27/16)    Time  8   Period Weeks   Status New     OT LONG TERM GOAL #2   Title Grip strength to be 30 lbs or greater Lt hand to assist with opening jars/containers   Baseline renewal = 25 lbs   Time 8   Period Weeks   Status New     OT LONG TERM GOAL #3   Title Pt to increase 4th and 5th digit PIP flexion by 10* for better gripping   Baseline renewal: 4th = 95*, 5th = 85* (with MP's blocked in ext)    Time 8   Period Weeks   Status New     OT LONG TERM GOAL #4   Title Pt to improve MP flexion of index and long finger by 5* or greater for better  gripping   Baseline renewal: index = 47*, long = 35*   Time 8   Period Weeks   Status New               Plan - 02/03/16 3094    Clinical Impression Statement Pt is progressing towards updated goals.    Rehab Potential Fair   OT Frequency 1x / week   OT Duration 8 weeks   OT Treatment/Interventions Self-care/ADL training;Therapeutic exercise;Patient/family education;Manual  Therapy;Splinting;Therapeutic exercises;Parrafin;DME and/or AE instruction;Compression bandaging;Therapeutic activities;Electrical Stimulation;Fluidtherapy;Scar mobilization;Moist Heat;Contrast Bath;Passive range of motion   Plan joint mobs A/ROM, P/ROM   Consulted and Agree with Plan of Care Patient      Patient will benefit from skilled therapeutic intervention in order to improve the following deficits and impairments:  Decreased coordination, Impaired flexibility, Increased edema, Impaired sensation, Decreased skin integrity, Impaired UE functional use, Pain, Decreased strength  Visit Diagnosis: Stiffness of left hand, not elsewhere classified  Other symptoms and signs involving the musculoskeletal system  Pain in joints of left hand  Localized edema    Problem List Patient Active Problem List   Diagnosis Date Noted  . Prediabetes     Todd Lawrence 02/03/2016, 9:19 AM [name], OTR/L [phone] Phone: [phone] 9:20 AM 02/03/16 [facility] [address] [place], [place], [address] Phone: [phone]   Fax:  [phone]  Name: Todd Lawrence MRN: [id number] Date of Birth: 12/29/1950

## 2016-02-10 ENCOUNTER — Ambulatory Visit: Payer: Medicare Other | Attending: Plastic Surgery | Admitting: Occupational Therapy

## 2016-02-10 DIAGNOSIS — R600 Localized edema: Secondary | ICD-10-CM | POA: Diagnosis present

## 2016-02-10 DIAGNOSIS — R208 Other disturbances of skin sensation: Secondary | ICD-10-CM | POA: Insufficient documentation

## 2016-02-10 DIAGNOSIS — M25542 Pain in joints of left hand: Secondary | ICD-10-CM | POA: Insufficient documentation

## 2016-02-10 DIAGNOSIS — R29898 Other symptoms and signs involving the musculoskeletal system: Secondary | ICD-10-CM | POA: Insufficient documentation

## 2016-02-10 DIAGNOSIS — M25642 Stiffness of left hand, not elsewhere classified: Secondary | ICD-10-CM | POA: Diagnosis not present

## 2016-02-10 NOTE — Therapy (Signed)
[place] [address] [place], [place], [address] Phone: [phone]   Fax:  [phone]  Occupational Therapy Treatment  Patient Details  Name: Todd Lawrence MRN: [id number] Date of Birth: 1950-11-14 Referring Provider: Dr. Tressa Busman  Encounter Date: 02/10/2016      OT End of Session - 02/10/16 0852    Visit Number 18   Number of Visits 23   Date for OT Re-Evaluation 03/27/16   Authorization Type [employer] [facility]    Authorization Time Period week 3/8   Authorization - Visit Number 18   Authorization - Number of Visits 20   OT Start Time 4401   OT Stop Time 0930   OT Time Calculation (min) 43 min   Equipment Utilized During Treatment paraffin   Activity Tolerance Patient tolerated treatment well   Behavior During Therapy [facility] for tasks assessed/performed      Past Medical History:  Diagnosis Date  . Diabetes mellitus without complication ([place])   . Hyperlipidemia     Past Surgical History:  Procedure Laterality Date  . CERVICAL FUSION    . FOOT SURGERY    . KNEE ARTHROSCOPY      There were no vitals filed for this visit.      Subjective Assessment - 02/10/16 0857    Subjective  denies pain   Patient Stated Goals Get as much use with Lt hand as possible, pick up my grandson   Currently in Pain? No/denies              Treatment: LUE Paraffin  Number Minutes Paraffin 8 Minutes  LUE Paraffin Location Hand;Wrist  Comments to decrease stiffness  Manual Therapy  Passive ROM Soft tissue mobs at base of digits 2,3 P/ROMto digits 2,3 MP's, PIP's isolated and compositely, followed by place and hold ex's as able.    putty exercises for increased grip with green for digits 4, 5 and red putty for finger adduction Pt was instructed in wrist, forearm and biceps strengthening 10 -15 reps each see pr instructions.               OT Education - 02/10/16 0941    Education provided  Yes   Education Details updates to HEP, green putty for gross grasp, wrist, forearm and biceps strengthening   Person(s) Educated Patient   Methods Explanation;Demonstration;Handout   Comprehension Verbalized understanding          OT Short Term Goals - 11/25/15 0272      OT SHORT TERM GOAL #1   Title Independent with updated HEP PRN (DUE 11/27/15)   Time 4   Period Weeks   Status Achieved     OT SHORT TERM GOAL #2   Title Independent with edema reduction strategies   Time 4   Period Weeks   Status Achieved     OT SHORT TERM GOAL #3   Title Pt to verbalize understanding with potential A/E needs and how to acquire for one handed use to increase independence with ADLS (including for cutting food and tying shoes)    Time 4   Period Weeks   Status Achieved     OT SHORT TERM GOAL #4   Title Pt to demo improved ROM in all remaining digits of Lt hand at MP's and PIP's by 10 degrees or greater   Time 4   Period Weeks   Status Partially Met  met at some joints/digits, not others - see assessment  OT Long Term Goals - 01/27/16 1044      OT LONG TERM GOAL #1   Title Independent with updated HEP (All LTG's due 03/27/16)    Time 8   Period Weeks   Status New     OT LONG TERM GOAL #2   Title Grip strength to be 30 lbs or greater Lt hand to assist with opening jars/containers   Baseline renewal = 25 lbs   Time 8   Period Weeks   Status New     OT LONG TERM GOAL #3   Title Pt to increase 4th and 5th digit PIP flexion by 10* for better gripping   Baseline renewal: 4th = 95*, 5th = 85* (with MP's blocked in ext)    Time 8   Period Weeks   Status New     OT LONG TERM GOAL #4   Title Pt to improve MP flexion of index and long finger by 5* or greater for better  gripping   Baseline renewal: index = 47*, long = 35*   Time 8   Period Weeks   Status New               Plan - 02/10/16 2671    Clinical Impression Statement Pt is progressing towards goals.  He demonstrates understanding of updates to HEP.   Clinical Impairments Affecting Rehab Potential severity of deficits   OT Frequency 1x / week   OT Duration 8 weeks   OT Treatment/Interventions Self-care/ADL training;Therapeutic exercise;Patient/family education;Manual Therapy;Splinting;Therapeutic exercises;Parrafin;DME and/or AE instruction;Compression bandaging;Therapeutic activities;Electrical Stimulation;Fluidtherapy;Scar mobilization;Moist Heat;Contrast Bath;Passive range of motion   Plan joint mobs, a/ROM, P/ROM, strengthening- anticipate d/c next visit   Consulted and Agree with Plan of Care Patient      Patient will benefit from skilled therapeutic intervention in order to improve the following deficits and impairments:  Decreased coordination, Impaired flexibility, Increased edema, Impaired sensation, Decreased skin integrity, Impaired UE functional use, Pain, Decreased strength  Visit Diagnosis: Stiffness of left hand, not elsewhere classified  Other symptoms and signs involving the musculoskeletal system  Pain in joints of left hand  Other disturbances of skin sensation    Problem List Patient Active Problem List   Diagnosis Date Noted  . Prediabetes     RINE,KATHRYN 02/10/2016, 9:44 AM [name], OTR/L [phone] Phone: [phone] 9:53 AM 02/10/16 [place] [address] [place], [place], [address] Phone: [phone]   Fax:  [phone]  Name: Todd Lawrence MRN: [id number] Date of Birth: 1950-12-10

## 2016-02-10 NOTE — Patient Instructions (Signed)
AROM: Wrist Extension   .  With _left___ palm down, bend wrist up. Repeat __15__ times per set.  Do _1-_2__ sessions per day. With 2 lbs weight    AROM: Wrist Flexion   With__left___ palm up, bend wrist up. Repeat __15__ times per set.  Do _1-2__ sessions per day.   AROM: Forearm Pronation / Supination   With _left___ arm in handshake position, slowly rotate palm down until stretch is felt. Relax. Then rotate palm up until stretch is felt. Holding hammer if able Repeat _15___ times per set. Do _1-2__ sessions per day.  Copyright  [employer]. All rights reserved.   USE a 4 lbs cuff weight and with your elbow supported bend and straighten elbow,  15 reps 1-2 x day

## 2016-02-17 ENCOUNTER — Ambulatory Visit: Payer: Medicare Other | Admitting: Occupational Therapy

## 2016-02-17 DIAGNOSIS — M25542 Pain in joints of left hand: Secondary | ICD-10-CM

## 2016-02-17 DIAGNOSIS — M25642 Stiffness of left hand, not elsewhere classified: Secondary | ICD-10-CM | POA: Diagnosis not present

## 2016-02-17 DIAGNOSIS — R208 Other disturbances of skin sensation: Secondary | ICD-10-CM

## 2016-02-17 DIAGNOSIS — R29898 Other symptoms and signs involving the musculoskeletal system: Secondary | ICD-10-CM

## 2016-02-17 DIAGNOSIS — R600 Localized edema: Secondary | ICD-10-CM

## 2016-02-17 NOTE — Therapy (Signed)
[facility] [address] [place], [place], [address] Phone: [phone]   Fax:  [phone]  Occupational Therapy Treatment  Patient Details  Name: RUDRA HOBBINS MRN: [id number] Date of Birth: December 18, 1950 Referring Provider: Dr. Tressa Busman  Encounter Date: 02/17/2016      OT End of Session - 02/19/16 2053    Visit Number 19   Number of Visits 23   Date for OT Re-Evaluation 03/27/16   Authorization Type [facility]    Authorization Time Period week 4/8   Authorization - Visit Number 26   Authorization - Number of Visits 20   OT Start Time 0177   OT Stop Time 0930   OT Time Calculation (min) 43 min   Activity Tolerance Patient tolerated treatment well   Behavior During Therapy [facility] for tasks assessed/performed      Past Medical History:  Diagnosis Date  . Diabetes mellitus without complication ([place])   . Hyperlipidemia     Past Surgical History:  Procedure Laterality Date  . CERVICAL FUSION    . FOOT SURGERY    . KNEE ARTHROSCOPY      There were no vitals filed for this visit.      Subjective Assessment - 02/19/16 2046    Subjective  mild pain in hand   Patient Stated Goals Get as much use with Lt hand as possible, pick up my grandson   Currently in Pain? Yes   Pain Score 3    Pain Location Hand   Pain Orientation Left   Pain Type Acute pain   Pain Onset More than a month ago   Pain Frequency Intermittent   Aggravating Factors  P/ROM   Pain Relieving Factors massage   Multiple Pain Sites No            Treatment: LUE Paraffin  Number Minutes Paraffin 10 Minutes  LUE Paraffin Location Hand;Wrist  Comments to decrease stiffness  Manual Therapy  Passive ROM P/ROM to digits 2,3 MP's, PIP's isolated and compositely, followed by place and hold ex's as able.     Therapist  checked progress towards goals and discussed plans for d/c.Pt is in agreement.                    OT Short Term Goals - 02/17/16 0917      OT SHORT TERM GOAL #1   Title Independent with updated HEP PRN (DUE 11/27/15)   Time 4   Period Weeks   Status Achieved     OT SHORT TERM GOAL #2   Title Independent with edema reduction strategies   Time 4   Period Weeks   Status Achieved     OT SHORT TERM GOAL #3   Title Pt to verbalize understanding with potential A/E needs and how to acquire for one handed use to increase independence with ADLS (including for cutting food and tying shoes)    Time 4   Period Weeks   Status Achieved     OT SHORT TERM GOAL #4   Title Pt to demo improved ROM in all remaining digits of Lt hand at MP's and PIP's by 10 degrees or greater   Time 4   Period Weeks   Status Partially Met  met at some joints/digits, not others - see assessment           OT Long Term Goals - 02/17/16 0909      OT LONG TERM GOAL #1  Title Independent with updated HEP (All LTG's due 03/27/16)    Time 8   Period Weeks   Status Achieved     OT LONG TERM GOAL #2   Title Grip strength to be 30 lbs or greater Lt hand to assist with opening jars/containers   Baseline renewal = 25 lbs   Time 8   Period Weeks   Status Not Met  28 lbs     OT LONG TERM GOAL #3   Title Pt to increase 4th and 5th digit PIP flexion by 10* for better gripping   Baseline renewal: 4th = 95*, 5th = 85* (with MP's blocked in ext)    Time 8   Period Weeks   Status Partially Met  digits 4,5:100, 95, met for digit 5     OT LONG TERM GOAL #4   Title Pt to improve MP flexion of index and long finger by 5* or greater for better  gripping   Baseline renewal: index = 47*, long = 35*   Time 8   Period Weeks   Status Achieved  index 55, long 45               Plan - 02/19/16 2047    Clinical Impression Statement Pt demonstrates overall progress. Pt verbalizes understanding of HEP{ and he agrees with plans for d/c. Pt may benefit from additional therapy in the future if he undergoes webspace  surgery.   Rehab Potential Fair   Clinical Impairments Affecting Rehab Potential severity of deficits   OT Frequency 1x / week   OT Duration 8 weeks   OT Treatment/Interventions Self-care/ADL training;Therapeutic exercise;Patient/family education;Manual Therapy;Splinting;Therapeutic exercises;Parrafin;DME and/or AE instruction;Compression bandaging;Therapeutic activities;Electrical Stimulation;Fluidtherapy;Scar mobilization;Moist Heat;Contrast Bath;Passive range of motion   Plan d/c   Consulted and Agree with Plan of Care Patient      Patient will benefit from skilled therapeutic intervention in order to improve the following deficits and impairments:  Decreased coordination, Impaired flexibility, Increased edema, Impaired sensation, Decreased skin integrity, Impaired UE functional use, Pain, Decreased strength  Visit Diagnosis: Stiffness of left hand, not elsewhere classified  Other symptoms and signs involving the musculoskeletal system  Pain in joints of left hand  Other disturbances of skin sensation  Localized edema   OCCUPATIONAL THERAPY DISCHARGE SUMMARY    Current functional level related to goals / functional outcomes: See above, pt made overall progress towards goals yet he remains limited in index and ring finger ROM.   Remaining deficits: Decreased ROM, decreased strength   Education / Equipment: Pt was educated regarding HEP. He demonstrates understanding of all education and pt agrees to d/c.  Pt did not fully meet all goals due to severity of deficits. Pt is being d/c due to reaching maximal potential at this time. Plan: Patient agrees to discharge.  Patient goals were partially met.                                                                                                      ?????      Problem List Patient Active Problem List  Diagnosis Date Noted  . Prediabetes     Loura Pitt 02/19/2016, 8:56 PM [name], OTR/L [phone] Phone: [phone] 8:56 PM 02/19/16 [facility] [address] [place], [place], [address] Phone: [phone]   Fax:  [phone]  Name: EMET RAFANAN MRN: [id number] Date of Birth: December 31, 1950

## 2016-03-09 ENCOUNTER — Encounter: Payer: Self-pay | Admitting: Family Medicine

## 2016-04-05 ENCOUNTER — Other Ambulatory Visit: Payer: Self-pay | Admitting: Family Medicine

## 2016-04-22 ENCOUNTER — Ambulatory Visit (HOSPITAL_COMMUNITY)
Admission: RE | Admit: 2016-04-22 | Discharge: 2016-04-22 | Disposition: A | Discharge status: Home / Self Care | Payer: Medicare Other | Source: Ambulatory Visit | Attending: Cardiovascular Disease | Admitting: Cardiovascular Disease

## 2016-04-22 ENCOUNTER — Other Ambulatory Visit (HOSPITAL_COMMUNITY): Payer: Self-pay | Admitting: Orthopedic Surgery

## 2016-04-22 DIAGNOSIS — M7989 Other specified soft tissue disorders: Secondary | ICD-10-CM

## 2016-04-22 DIAGNOSIS — I868 Varicose veins of other specified sites: Secondary | ICD-10-CM | POA: Diagnosis not present

## 2016-05-03 ENCOUNTER — Ambulatory Visit
Admission: RE | Admit: 2016-05-03 | Discharge: 2016-05-03 | Disposition: A | Discharge status: Home / Self Care | Payer: Medicare Other | Source: Ambulatory Visit | Attending: Family Medicine | Admitting: Family Medicine

## 2016-05-03 ENCOUNTER — Ambulatory Visit (INDEPENDENT_AMBULATORY_CARE_PROVIDER_SITE_OTHER): Payer: Medicare Other | Admitting: Family Medicine

## 2016-05-03 ENCOUNTER — Encounter: Payer: Self-pay | Admitting: Family Medicine

## 2016-05-03 VITALS — BP 112/80 | HR 66 | Temp 98.2°F | Resp 14 | Wt 170.0 lb

## 2016-05-03 DIAGNOSIS — M545 Low back pain, unspecified: Secondary | ICD-10-CM

## 2016-05-03 MED ORDER — CYCLOBENZAPRINE HCL 10 MG PO TABS
10.0000 mg | ORAL_TABLET | Freq: Three times a day (TID) | ORAL | 0 refills | Status: DC | PRN
Start: 2016-05-03 — End: 2016-11-16

## 2016-05-03 MED ORDER — OXYCODONE-ACETAMINOPHEN 10-325 MG PO TABS: 1.0000 | ORAL_TABLET | Freq: Three times a day (TID) | ORAL | 0 refills | Status: DC | PRN

## 2016-05-03 MED ORDER — PREDNISONE 20 MG PO TABS: ORAL_TABLET | ORAL | 0 refills | Status: DC

## 2016-05-03 MED ORDER — TIZANIDINE HCL 4 MG PO CAPS
4.0000 mg | ORAL_CAPSULE | Freq: Three times a day (TID) | ORAL | 0 refills | Status: DC | PRN
Start: 2016-05-03 — End: 2016-05-03

## 2016-05-03 NOTE — Progress Notes (Signed)
Subjective:    Patient ID: Todd Lawrence, male    DOB: [birth year], 65 y.o.   MRN: [id number]  HPI Patient presents with one-week of worsening low back pain. Pain is located on either side of his lumbar vertebrae around the level of L4-L5. The pain is intense. Patient does not remember any specific inciting event. It has gradually gotten worse. He also does report some weakness in his left leg and some pain radiating into his left leg when he walks although the pain does not sound neuropathic. He denies any bowel or bladder incontinence. He denies any saddle anesthesias. He denies any true radicular neuropathic pain. He denies any fevers or chills. 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 However he has been cleared for this for several months. He also recently underwent knee surgery and shoulder surgery but has done well postoperatively and has been afebrile the entire time Past Medical History:  Diagnosis Date  . Acute meniscal tear of left knee   . Diabetes mellitus without complication ([place])   . Hyperlipidemia   . Left rotator cuff tear    Past Surgical History:  Procedure Laterality Date  . CERVICAL FUSION    . FOOT SURGERY    . KNEE ARTHROSCOPY     Current Outpatient Prescriptions on File Prior to Visit  Medication Sig Dispense Refill  . aspirin 81 MG tablet Take 81 mg by mouth daily.    Marland Kitchen atorvastatin (LIPITOR) 20 MG tablet TAKE 1 TABLET BY MOUTH EVERY DAY 90 tablet 0  . Misc Natural Products (GLUCOSAMINE CHOND COMPLEX/MSM PO) Take 1 tablet by mouth daily.     . Multiple Vitamin (MULTIVITAMIN WITH MINERALS) TABS tablet Take 1 tablet by mouth daily.     No current facility-administered medications on file prior to visit.    No Known Allergies Social History   Social History  . Marital status: Married    Spouse name: N/A  . Number of children: N/A  . Years  of education: N/A   Occupational History  . Not on file.   Social History Main Topics  . Smoking status: Never Smoker  . Smokeless tobacco: Never Used  . Alcohol use No  . Drug use: No  . 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 Not on file   Social History Narrative  . No narrative on file      Review of Systems  All other systems reviewed and are negative.      Objective:   Physical Exam  Cardiovascular: Normal rate, regular rhythm and normal heart sounds.   Pulmonary/Chest: Effort normal and breath sounds normal. No respiratory distress. He has no wheezes. He has no rales.  Musculoskeletal:       Lumbar back: He exhibits decreased range of motion, tenderness, pain and spasm. He exhibits no bony tenderness.  Vitals reviewed.         Assessment & Plan:  Acute bilateral low back pain without sciatica - Plan: tiZANidine (ZANAFLEX) 4 MG capsule, predniSONE (DELTASONE) 20 MG tablet, oxyCODONE-acetaminophen (PERCOCET) 10-325 MG tablet, DG Lumbar Spine Complete  Differential diagnosis is muscle spasm in the lumbar paraspinal muscles versus herniated disc in the back. Begin Zanaflex 4 mg by mouth every 8 hours when necessary pain. Use prednisone taper pack for 6  days. Proceed with an x-ray of the lumbar spine. Patient was given Percocet for breakthrough pain. He is instructed to take a half a tablet every 4 hours as needed. Can take up to a whole tablet if the pain intensifies.

## 2016-05-03 NOTE — Addendum Note (Signed)
Addended by: Shary Decamp B on: 05/03/2016 04:19 PM   Modules accepted: Orders

## 2016-05-12 ENCOUNTER — Telehealth: Payer: Self-pay | Admitting: Family Medicine

## 2016-05-12 DIAGNOSIS — G8929 Other chronic pain: Secondary | ICD-10-CM

## 2016-05-12 DIAGNOSIS — M5442 Lumbago with sciatica, left side: Principal | ICD-10-CM

## 2016-05-12 NOTE — Telephone Encounter (Signed)
Patient is still having back pain after taking prednisone, would like to know what his next step should be since still having so much pain  [phone]

## 2016-05-13 ENCOUNTER — Telehealth: Payer: Self-pay | Admitting: Family Medicine

## 2016-05-13 ENCOUNTER — Ambulatory Visit: Payer: Self-pay | Admitting: Family Medicine

## 2016-05-13 DIAGNOSIS — M545 Low back pain, unspecified: Secondary | ICD-10-CM

## 2016-05-13 NOTE — Telephone Encounter (Signed)
Pt called and states that his MRI is set up on 05/22/16 and would like to know if you could prescribe him something in the mean time for his leg giving away.

## 2016-05-13 NOTE — Telephone Encounter (Signed)
Mri of l spine

## 2016-05-13 NOTE — Telephone Encounter (Signed)
Left pt message that MRI has been ordered

## 2016-05-14 MED ORDER — PREDNISONE 20 MG PO TABS: ORAL_TABLET | ORAL | 0 refills | Status: DC

## 2016-05-14 NOTE — Telephone Encounter (Signed)
Unfortunately, medicine will not likely help the leg giving out.  We could try one additional prednisone taper pack.

## 2016-05-14 NOTE — Telephone Encounter (Signed)
Patient aware of providers recommendations and med sent to pharm 

## 2016-05-16 ENCOUNTER — Ambulatory Visit
Admission: RE | Admit: 2016-05-16 | Discharge: 2016-05-16 | Disposition: A | Discharge status: Home / Self Care | Payer: Medicare Other | Source: Ambulatory Visit | Attending: Family Medicine | Admitting: Family Medicine

## 2016-05-16 DIAGNOSIS — M5442 Lumbago with sciatica, left side: Principal | ICD-10-CM

## 2016-05-16 DIAGNOSIS — G8929 Other chronic pain: Secondary | ICD-10-CM

## 2016-05-19 ENCOUNTER — Other Ambulatory Visit: Payer: Self-pay | Admitting: Family Medicine

## 2016-05-19 DIAGNOSIS — M545 Low back pain, unspecified: Secondary | ICD-10-CM

## 2016-05-19 DIAGNOSIS — M79604 Pain in right leg: Secondary | ICD-10-CM

## 2016-05-20 ENCOUNTER — Ambulatory Visit
Admission: RE | Admit: 2016-05-20 | Discharge: 2016-05-20 | Disposition: A | Discharge status: Home / Self Care | Payer: Medicare Other | Source: Ambulatory Visit | Attending: Family Medicine | Admitting: Family Medicine

## 2016-05-20 DIAGNOSIS — M545 Low back pain, unspecified: Secondary | ICD-10-CM

## 2016-05-20 MED ORDER — IOPAMIDOL (ISOVUE-M 200) INJECTION 41%
1.0000 mL | Freq: Once | INTRAMUSCULAR | Status: AC
  Administered 2016-05-20: 1 mL via EPIDURAL

## 2016-05-20 MED ORDER — METHYLPREDNISOLONE ACETATE 40 MG/ML INJ SUSP (RADIOLOG
120.0000 mg | Freq: Once | INTRAMUSCULAR | Status: AC
  Administered 2016-05-20: 120 mg via EPIDURAL

## 2016-05-20 NOTE — Discharge Instructions (Signed)

## 2016-05-21 ENCOUNTER — Other Ambulatory Visit: Payer: Self-pay | Admitting: Family Medicine

## 2016-05-21 DIAGNOSIS — M545 Low back pain, unspecified: Secondary | ICD-10-CM

## 2016-05-22 ENCOUNTER — Other Ambulatory Visit: Payer: Medicare Other

## 2016-06-11 ENCOUNTER — Telehealth: Payer: Self-pay | Admitting: Family Medicine

## 2016-06-11 MED ORDER — CELECOXIB 200 MG PO CAPS: 200.0000 mg | ORAL_CAPSULE | Freq: Every day | ORAL | 3 refills | Status: DC

## 2016-06-11 NOTE — Telephone Encounter (Signed)
Pt called and states that he is still hurting in his leg and hip in the morning time and taking 3 ibuprofen helps and then he has to take it again late afternoon. He did go for the injection and it also helped but MD told him it may take 2 injections to get it resolved. Pt wanted to know what he could do or take until his next injection? Discussed this with MBD and she recommended Celebrex 200mg  qd. Pt aware of recommendation and med sent to pharm. Pt was informed not to take any NSAIDS with this if he needed extra pain relief he could use tylenol. Pt verbalized understanding and will call back Monday if no better.

## 2016-06-17 ENCOUNTER — Telehealth: Payer: Self-pay | Admitting: Family Medicine

## 2016-06-17 NOTE — Telephone Encounter (Signed)
Pt called and states that the diclofenac helped some but still not much better and would like to know what to do next?

## 2016-06-17 NOTE — Telephone Encounter (Signed)
Add gabapentin 100 tid.  Can wean up if beneficial.

## 2016-06-18 ENCOUNTER — Telehealth: Payer: Self-pay | Admitting: Family Medicine

## 2016-06-18 DIAGNOSIS — M545 Low back pain, unspecified: Secondary | ICD-10-CM

## 2016-06-18 MED ORDER — GABAPENTIN 100 MG PO CAPS: 100.0000 mg | ORAL_CAPSULE | Freq: Three times a day (TID) | ORAL | 3 refills | Status: DC

## 2016-06-18 NOTE — Telephone Encounter (Signed)
Patient returning your call from yesterday. I gave him Dr. Samella Parr recommendations however he would like to discuss with you. You can call him on his house or cell.  CB# [phone] CB# Cell [phone]

## 2016-06-18 NOTE — Telephone Encounter (Signed)
Patient states he has had steroid injections shots before in his back at [place] with Dr. Jobe Igo he would like to know if we can refer him again.   CB# [phone]

## 2016-06-18 NOTE — Telephone Encounter (Signed)
Spoke to pt and informed him if he wanted the second injection he could contact Dr. Nelva Bush and have that set up himself we did not need to set it up for him. Also sent med to pharm and pt aware.

## 2016-06-23 NOTE — Telephone Encounter (Signed)
Pt ready for next epidural injection, order placed.

## 2016-06-24 ENCOUNTER — Other Ambulatory Visit: Payer: Self-pay | Admitting: Family Medicine

## 2016-06-24 DIAGNOSIS — M545 Low back pain, unspecified: Secondary | ICD-10-CM

## 2016-06-24 NOTE — Telephone Encounter (Signed)
Ok absolutely

## 2016-07-04 ENCOUNTER — Other Ambulatory Visit: Payer: Self-pay | Admitting: Family Medicine

## 2016-07-06 ENCOUNTER — Ambulatory Visit
Admission: RE | Admit: 2016-07-06 | Discharge: 2016-07-06 | Disposition: A | Discharge status: Home / Self Care | Payer: Medicare Other | Source: Ambulatory Visit | Attending: Family Medicine | Admitting: Family Medicine

## 2016-07-06 DIAGNOSIS — M545 Low back pain, unspecified: Secondary | ICD-10-CM

## 2016-07-06 MED ORDER — IOPAMIDOL (ISOVUE-M 200) INJECTION 41%
1.0000 mL | Freq: Once | INTRAMUSCULAR | Status: AC
  Administered 2016-07-06: 1 mL via EPIDURAL

## 2016-07-06 MED ORDER — METHYLPREDNISOLONE ACETATE 40 MG/ML INJ SUSP (RADIOLOG
120.0000 mg | Freq: Once | INTRAMUSCULAR | Status: AC
  Administered 2016-07-06: 120 mg via EPIDURAL

## 2016-08-10 DIAGNOSIS — Z0120 Encounter for dental examination and cleaning without abnormal findings: Secondary | ICD-10-CM | POA: Diagnosis not present

## 2016-08-25 ENCOUNTER — Encounter: Payer: Self-pay | Admitting: Family Medicine

## 2016-08-25 ENCOUNTER — Telehealth: Payer: Self-pay | Admitting: Family Medicine

## 2016-08-25 ENCOUNTER — Other Ambulatory Visit: Payer: Self-pay | Admitting: Family Medicine

## 2016-08-25 DIAGNOSIS — M5440 Lumbago with sciatica, unspecified side: Principal | ICD-10-CM

## 2016-08-25 DIAGNOSIS — G8929 Other chronic pain: Secondary | ICD-10-CM

## 2016-08-25 NOTE — Telephone Encounter (Signed)
Patient is calling to get another referral for steroid shot in back if possible, would like to get done asap, because his wife is having surgery soon and would like to get it done  [phone]

## 2016-08-25 NOTE — Telephone Encounter (Signed)
Referral placed for [facility]

## 2016-08-27 ENCOUNTER — Other Ambulatory Visit: Payer: Medicare Other

## 2016-08-30 ENCOUNTER — Inpatient Hospital Stay: Admission: RE | Admit: 2016-08-30 | Payer: Self-pay | Source: Ambulatory Visit

## 2016-09-02 ENCOUNTER — Inpatient Hospital Stay: Admission: RE | Admit: 2016-09-02 | Payer: Self-pay | Source: Ambulatory Visit

## 2016-09-20 ENCOUNTER — Ambulatory Visit
Admission: RE | Admit: 2016-09-20 | Discharge: 2016-09-20 | Disposition: A | Discharge status: Home / Self Care | Payer: Medicare HMO | Source: Ambulatory Visit | Attending: Family Medicine | Admitting: Family Medicine

## 2016-09-20 DIAGNOSIS — M5440 Lumbago with sciatica, unspecified side: Principal | ICD-10-CM

## 2016-09-20 DIAGNOSIS — M545 Low back pain: Secondary | ICD-10-CM | POA: Diagnosis not present

## 2016-09-20 DIAGNOSIS — G8929 Other chronic pain: Secondary | ICD-10-CM

## 2016-09-20 MED ORDER — IOPAMIDOL (ISOVUE-M 200) INJECTION 41%
1.0000 mL | Freq: Once | INTRAMUSCULAR | Status: AC
  Administered 2016-09-20: 1 mL via EPIDURAL

## 2016-09-20 MED ORDER — METHYLPREDNISOLONE ACETATE 40 MG/ML INJ SUSP (RADIOLOG
120.0000 mg | Freq: Once | INTRAMUSCULAR | Status: AC
  Administered 2016-09-20: 120 mg via EPIDURAL

## 2016-09-20 NOTE — Discharge Instructions (Signed)

## 2016-10-28 DIAGNOSIS — E785 Hyperlipidemia, unspecified: Secondary | ICD-10-CM | POA: Insufficient documentation

## 2016-11-02 ENCOUNTER — Other Ambulatory Visit: Payer: Self-pay

## 2016-11-02 ENCOUNTER — Other Ambulatory Visit: Payer: Medicare HMO

## 2016-11-02 DIAGNOSIS — E785 Hyperlipidemia, unspecified: Secondary | ICD-10-CM

## 2016-11-02 DIAGNOSIS — Z0000 Encounter for general adult medical examination without abnormal findings: Secondary | ICD-10-CM | POA: Diagnosis not present

## 2016-11-02 DIAGNOSIS — E119 Type 2 diabetes mellitus without complications: Secondary | ICD-10-CM | POA: Diagnosis not present

## 2016-11-02 DIAGNOSIS — Z79899 Other long term (current) drug therapy: Secondary | ICD-10-CM | POA: Diagnosis not present

## 2016-11-02 LAB — LIPID PANEL
CHOL/HDL RATIO: 2.9 ratio (ref <5.0)
Cholesterol: 183 mg/dL (ref <200)
HDL: 64 mg/dL (ref >40)
LDL CALC: 103 mg/dL — AB (ref <100)
Triglycerides: 80 mg/dL (ref <150)
VLDL: 16 mg/dL (ref <30)

## 2016-11-02 LAB — CBC WITH DIFFERENTIAL/PLATELET
BASOS ABS: 52 {cells}/uL (ref 0–200)
Basophils Relative: 1 %
EOS PCT: 5 %
Eosinophils Absolute: 260 cells/uL (ref 15–500)
HEMATOCRIT: 40.8 % (ref 38.5–50.0)
HEMOGLOBIN: 13.6 g/dL (ref 13.0–17.0)
LYMPHS ABS: 1612 {cells}/uL (ref 850–3900)
Lymphocytes Relative: 31 %
MCH: 30.7 pg (ref 27.0–33.0)
MCHC: 33.3 g/dL (ref 32.0–36.0)
MCV: 92.1 fL (ref 80.0–100.0)
MONO ABS: 572 {cells}/uL (ref 200–950)
MPV: 10.2 fL (ref 7.5–12.5)
Monocytes Relative: 11 %
NEUTROS PCT: 52 %
Neutro Abs: 2704 cells/uL (ref 1500–7800)
Platelets: 253 10*3/uL (ref 140–400)
RBC: 4.43 MIL/uL (ref 4.20–5.80)
RDW: 13.5 % (ref 11.0–15.0)
WBC: 5.2 10*3/uL (ref 3.8–10.8)

## 2016-11-02 LAB — COMPLETE METABOLIC PANEL WITH GFR
ALBUMIN: 4 g/dL (ref 3.6–5.1)
ALK PHOS: 78 U/L (ref 40–115)
ALT: 19 U/L (ref 9–46)
AST: 21 U/L (ref 10–35)
BUN: 17 mg/dL (ref 7–25)
CHLORIDE: 101 mmol/L (ref 98–110)
CO2: 26 mmol/L (ref 20–31)
Calcium: 9.8 mg/dL (ref 8.6–10.3)
Creat: 0.87 mg/dL (ref 0.70–1.25)
GFR, EST NON AFRICAN AMERICAN: 89 mL/min (ref >=60)
GFR, Est African American: >89 mL/min (ref >=60)
GLUCOSE: 151 mg/dL — AB (ref 70–99)
POTASSIUM: 4.9 mmol/L (ref 3.5–5.3)
SODIUM: 138 mmol/L (ref 135–146)
Total Bilirubin: 0.4 mg/dL (ref 0.2–1.2)
Total Protein: 6.6 g/dL (ref 6.1–8.1)

## 2016-11-02 LAB — PSA: PSA: 0.4 ng/mL (ref <=4.0)

## 2016-11-02 LAB — TSH: TSH: 1.47 mIU/L (ref 0.40–4.50)

## 2016-11-03 LAB — HEMOGLOBIN A1C
Hgb A1c MFr Bld: 8.1 % — ABNORMAL HIGH (ref <5.7)
Mean Plasma Glucose: 186 mg/dL

## 2016-11-09 MED ORDER — METFORMIN HCL 500 MG PO TABS: 500.0000 mg | ORAL_TABLET | Freq: Two times a day (BID) | ORAL | 3 refills | Status: DC

## 2016-11-16 ENCOUNTER — Ambulatory Visit (INDEPENDENT_AMBULATORY_CARE_PROVIDER_SITE_OTHER): Payer: Medicare HMO | Admitting: Family Medicine

## 2016-11-16 ENCOUNTER — Encounter: Payer: Self-pay | Admitting: Family Medicine

## 2016-11-16 VITALS — BP 130/84 | HR 82 | Temp 98.3°F | Resp 16 | Ht 69.5 in | Wt 171.0 lb

## 2016-11-16 DIAGNOSIS — Z0000 Encounter for general adult medical examination without abnormal findings: Secondary | ICD-10-CM

## 2016-11-16 DIAGNOSIS — Z23 Encounter for immunization: Secondary | ICD-10-CM | POA: Diagnosis not present

## 2016-11-16 DIAGNOSIS — IMO0001 Reserved for inherently not codable concepts without codable children: Secondary | ICD-10-CM

## 2016-11-16 DIAGNOSIS — E1165 Type 2 diabetes mellitus with hyperglycemia: Secondary | ICD-10-CM | POA: Diagnosis not present

## 2016-11-16 MED ORDER — OMEPRAZOLE 40 MG PO CPDR: 40.0000 mg | DELAYED_RELEASE_CAPSULE | Freq: Every day | ORAL | 3 refills | Status: DC

## 2016-11-16 MED ORDER — OMEPRAZOLE 40 MG PO CPDR
40.0000 mg | DELAYED_RELEASE_CAPSULE | Freq: Every day | ORAL | 3 refills | Status: DC
Start: 2016-11-16 — End: 2017-03-10

## 2016-11-16 NOTE — Progress Notes (Signed)
Subjective:    Patient ID: Todd Lawrence, male    DOB: [birth year], 66 y.o.   MRN: [id number]  HPI  Patient is a 66 yo WM here for CPE.  He has no specific concerns.  Colonoscopy was performed in 2009.   He has had pneumovax 23.  Has had tetanus and zostavax.    Is due for prevnar 13.  Most recent lab work is below: Lab on 11/02/2016  Component Date Value Ref Range Status  . Sodium 11/02/2016 138  135 - 146 mmol/L Final  . Potassium 11/02/2016 4.9  3.5 - 5.3 mmol/L Final  . Chloride 11/02/2016 101  98 - 110 mmol/L Final  . CO2 11/02/2016 26  20 - 31 mmol/L Final  . Glucose, Bld 11/02/2016 151* 70 - 99 mg/dL Final  . BUN 11/02/2016 17  7 - 25 mg/dL Final  . Creat 11/02/2016 0.87  0.70 - 1.25 mg/dL Final   Comment:   For patients > or = 68 years of age: The upper reference limit for Creatinine is approximately 13% higher for people identified as African-American.     . Total Bilirubin 11/02/2016 0.4  0.2 - 1.2 mg/dL Final  . Alkaline Phosphatase 11/02/2016 78  40 - 115 U/L Final  . AST 11/02/2016 21  10 - 35 U/L Final  . ALT 11/02/2016 19  9 - 46 U/L Final  . Total Protein 11/02/2016 6.6  6.1 - 8.1 g/dL Final  . Albumin 11/02/2016 4.0  3.6 - 5.1 g/dL Final  . Calcium 11/02/2016 9.8  8.6 - 10.3 mg/dL Final  . GFR, Est African American 11/02/2016 >89  >=60 mL/min Final  . GFR, Est Non African American 11/02/2016 89  >=60 mL/min Final  . TSH 11/02/2016 1.47  0.40 - 4.50 mIU/L Final  . Cholesterol 11/02/2016 183  <200 mg/dL Final  . Triglycerides 11/02/2016 80  <150 mg/dL Final  . HDL 11/02/2016 64  >40 mg/dL Final  . Total CHOL/HDL Ratio 11/02/2016 2.9  <5.0 Ratio Final  . VLDL 11/02/2016 16  <30 mg/dL Final  . LDL Cholesterol 11/02/2016 103* <100 mg/dL Final  . WBC 11/02/2016 5.2  3.8 - 10.8 K/uL Final  . RBC 11/02/2016 4.43  4.20 - 5.80 MIL/uL Final  . Hemoglobin 11/02/2016 13.6  13.0 - 17.0 g/dL Final  . HCT 11/02/2016 40.8  38.5 - 50.0 % Final  . MCV 11/02/2016 92.1  80.0 -  100.0 fL Final  . MCH 11/02/2016 30.7  27.0 - 33.0 pg Final  . MCHC 11/02/2016 33.3  32.0 - 36.0 g/dL Final  . RDW 11/02/2016 13.5  11.0 - 15.0 % Final  . Platelets 11/02/2016 253  140 - 400 K/uL Final  . MPV 11/02/2016 10.2  7.5 - 12.5 fL Final  . Neutro Abs 11/02/2016 2704  1,500 - 7,800 cells/uL Final  . Lymphs Abs 11/02/2016 1612  850 - 3,900 cells/uL Final  . Monocytes Absolute 11/02/2016 572  200 - 950 cells/uL Final  . Eosinophils Absolute 11/02/2016 260  15 - 500 cells/uL Final  . Basophils Absolute 11/02/2016 52  0 - 200 cells/uL Final  . Neutrophils Relative % 11/02/2016 52  % Final  . Lymphocytes Relative 11/02/2016 31  % Final  . Monocytes Relative 11/02/2016 11  % Final  . Eosinophils Relative 11/02/2016 5  % Final  . Basophils Relative 11/02/2016 1  % Final  . Smear Review 11/02/2016 Criteria for review not met   Final  . Hgb A1c MFr  Bld 11/02/2016 8.1* <5.7 % Final   Comment:   For someone without known diabetes, a hemoglobin A1c value of 6.5% or greater indicates that they may have diabetes and this should be confirmed with a follow-up test.   For someone with known diabetes, a value <7% indicates that their diabetes is well controlled and a value greater than or equal to 7% indicates suboptimal control. A1c targets should be individualized based on duration of diabetes, age, comorbid conditions, and other considerations.   Currently, no consensus exists for use of hemoglobin A1c for diagnosis of diabetes for children.     . Mean Plasma Glucose 11/02/2016 186  mg/dL Final  . PSA 11/02/2016 0.4  <=4.0 ng/mL Final   Comment:   The total PSA value from this assay system is standardized against the WHO standard. The test result will be approximately 20% lower when compared to the equimolar-standardized total PSA (Beck[employer] Coulter). Comparison of serial PSA results should be interpreted with this fact in mind.   This test was performed using the Siemens  chemiluminescent method. Values obtained from different assay methods cannot be used interchangeably. PSA levels, regardless of value, should not be interpreted as absolute evidence of the presence or absence of disease.      Past Medical History:  Diagnosis Date  . Acute meniscal tear of left knee   . DDD (degenerative disc disease), lumbar    has received ESI x 3  . Diabetes mellitus without complication ([place])   . Hyperlipidemia   . Left rotator cuff tear    Past Surgical History:  Procedure Laterality Date  . CERVICAL FUSION    . FOOT SURGERY    . KNEE ARTHROSCOPY    . 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 aspirin 81 MG tablet Take 81 mg by mouth daily.    Marland Kitchen atorvastatin (LIPITOR) 20 MG tablet TAKE 1 TABLET BY MOUTH EVERY DAY 90 tablet 1  . metFORMIN (GLUCOPHAGE) 500 MG tablet Take 1 tablet (500 mg total) by mouth 2 (two) times daily with a meal. 60 tablet 3  . Misc Natural Products (GLUCOSAMINE CHOND COMPLEX/MSM PO) Take 1 tablet by mouth daily.     . Multiple Vitamin (MULTIVITAMIN WITH MINERALS) TABS tablet Take 1 tablet by mouth daily.    . celecoxib (CELEBREX) 200 MG capsule Take 1 capsule (200 mg total) by mouth daily. (Patient not taking: Reported on 11/16/2016) 30 capsule 3   No current facility-administered medications on file prior to visit.    No Known Allergies Social History   Social History  . Marital status: Married    Spouse name: N/A  . Number of children: N/A  . Years of education: N/A   Occupational History  . Not on file.   Social History Main Topics  . Smoking status: Never Smoker  . Smokeless tobacco: Never Used  . Alcohol use No  . Drug use: No  . 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 Not on file   Social History Narrative  . No narrative on file    Family History  Problem Relation Age of Onset  . Heart disease Father   . Asthma Sister   . Arthritis Sister      Review of Systems  All other  systems reviewed and are negative.      Objective:   Physical Exam  Constitutional: He is oriented to person, place, and time. He appears well-developed and well-nourished. No distress.  HENT:  Head: Normocephalic and atraumatic.  Right Ear: External ear normal.  Left Ear: External ear normal.  Nose: Nose normal.  Mouth/Throat: Oropharynx is clear and moist. No oropharyngeal exudate.  Eyes: Conjunctivae and EOM are normal. Pupils are equal, round, and reactive to light. Right eye exhibits no discharge. Left eye exhibits no discharge. No scleral icterus.  Neck: Normal range of motion. Neck supple. No JVD present. No thyromegaly present.  Cardiovascular: Normal rate, regular rhythm, normal heart sounds and intact distal pulses.  Exam reveals no gallop and no friction rub.   No murmur heard. Pulmonary/Chest: Effort normal and breath sounds normal. No respiratory distress. He has no wheezes. He has no rales. He exhibits no tenderness.  Abdominal: Soft. Bowel sounds are normal. He exhibits no distension and no mass. There is no tenderness. There is no rebound and no guarding.  Genitourinary: Penis normal.  Musculoskeletal: Normal range of motion. He exhibits no edema or tenderness.  Lymphadenopathy:    He has no cervical adenopathy.  Neurological: He is alert and oriented to person, place, and time. He has normal reflexes. No cranial nerve deficit. He exhibits normal muscle tone. Coordination normal.  Skin: Skin is warm. No rash noted. He is not diaphoretic. No erythema. No pallor.  Psychiatric: He has a normal mood and affect. His behavior is normal. Judgment and thought content normal.  Vitals reviewed.         Assessment & Plan:  Routine general medical examination at a health care facility  Uncontrolled type 2 diabetes mellitus  without complication, without long-term current use of insulin ([place])  Patient's exam was normal.  Had a long discussion regarding his diabetes.  Begin metformin 500 mg by mouth twice a day and recheck hemoglobin A1c in 3 months. Discussed low carbohydrate diet and increasing aerobic exercise. He is taking ibuprofen twice a day 600 mg for low back pain secondary to his degenerative disc disease and lumbar radiculopathy. I recommended that he take omeprazole 40 mg a day with that to avoid peptic ulcer secondary to NSAID overuse. He received Prevnar 13 today. The remainder of his immunizations are up-to-date. PSA was excellent. Colonoscopy is due next year. I recommended a diabetic eye exam. Diabetic foot exam was performed today and is normal.

## 2016-11-16 NOTE — Addendum Note (Signed)
Addended by: Shary Decamp B on: 11/16/2016 01:46 PM   Modules accepted: Orders

## 2016-11-22 DIAGNOSIS — D3131 Benign neoplasm of right choroid: Secondary | ICD-10-CM | POA: Diagnosis not present

## 2016-11-22 DIAGNOSIS — E119 Type 2 diabetes mellitus without complications: Secondary | ICD-10-CM | POA: Diagnosis not present

## 2016-11-22 DIAGNOSIS — D3132 Benign neoplasm of left choroid: Secondary | ICD-10-CM | POA: Diagnosis not present

## 2016-11-22 LAB — HM DIABETES EYE EXAM

## 2016-11-29 ENCOUNTER — Encounter: Payer: Self-pay | Admitting: Family Medicine

## 2016-12-24 ENCOUNTER — Other Ambulatory Visit: Payer: Self-pay | Admitting: Family Medicine

## 2017-02-01 ENCOUNTER — Ambulatory Visit (INDEPENDENT_AMBULATORY_CARE_PROVIDER_SITE_OTHER): Payer: Medicare HMO | Admitting: Family Medicine

## 2017-02-01 ENCOUNTER — Ambulatory Visit
Admission: RE | Admit: 2017-02-01 | Discharge: 2017-02-01 | Disposition: A | Discharge status: Home / Self Care | Payer: Medicare HMO | Source: Ambulatory Visit | Attending: Family Medicine | Admitting: Family Medicine

## 2017-02-01 VITALS — BP 128/90 | HR 74 | Temp 98.2°F | Resp 16 | Ht 69.5 in | Wt 166.0 lb

## 2017-02-01 DIAGNOSIS — R0789 Other chest pain: Secondary | ICD-10-CM | POA: Diagnosis not present

## 2017-02-01 DIAGNOSIS — M5412 Radiculopathy, cervical region: Secondary | ICD-10-CM

## 2017-02-01 DIAGNOSIS — R079 Chest pain, unspecified: Secondary | ICD-10-CM | POA: Diagnosis not present

## 2017-02-01 MED ORDER — PREDNISONE 20 MG PO TABS: ORAL_TABLET | ORAL | 0 refills | Status: DC

## 2017-02-01 NOTE — Progress Notes (Signed)
Subjective:    Patient ID: Todd Lawrence, male    DOB: [birth year], 66 y.o.   MRN: [id number]  HPI  Over the last week, the patient has developed a constant vague pain in his left posterior neck just midline to the shoulder blade. Pain is not reproduced by any movement of his arm. He denies any pleurisy. He denies any hemoptysis. He denies any fevers or chills. He denies any shortness of breath. He denies any angina. He denies any nausea or vomiting. There are no exacerbating or alleviating factors. He denies any numbness or tingling in the left arm. Exam today is unremarkable. 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 I suspect cervical radiculopathy as a source of his neck pain mid back pain. Past Medical History:  Diagnosis Date  . Acute meniscal tear of left knee   . DDD (degenerative disc disease), lumbar    has received ESI x 3  . Diabetes mellitus without complication ([place])   . Hyperlipidemia   . Left rotator cuff tear    Past Surgical History:  Procedure Laterality Date  . CERVICAL FUSION    . FOOT SURGERY    . KNEE ARTHROSCOPY    . 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 aspirin 81 MG tablet Take 81 mg by mouth daily.    Marland Kitchen atorvastatin (LIPITOR) 20 MG tablet TAKE 1 TABLET EVERY DAY 90 tablet 1  . metFORMIN (GLUCOPHAGE) 500 MG tablet Take 1 tablet (500 mg total) by mouth 2 (two) times daily with a meal. 60 tablet 3  . Misc Natural Products (GLUCOSAMINE CHOND COMPLEX/MSM PO) Take 1 tablet by mouth daily.     . Multiple Vitamin (MULTIVITAMIN WITH MINERALS) TABS tablet Take 1 tablet by mouth daily.    Marland Kitchen omeprazole (PRILOSEC) 40 MG capsule Take 1 capsule (40 mg total) by mouth daily. 30 capsule 3   No current facility-administered medications on file  prior to visit.    No Known Allergies Social History   Social History  . Marital status: Married    Spouse name: N/A  . Number of children: N/A  . Years of education: N/A   Occupational History  . Not on file.   Social History Main Topics  . Smoking status: Never Smoker  . Smokeless tobacco: Never Used  . Alcohol use No  . Drug use: No  . 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 Not on file   Social History Narrative  . No narrative on file     Review of Systems  All other systems reviewed and are negative.      Objective:   Physical Exam  Constitutional: He is oriented to person, place, and time. He appears well-developed and well-nourished. No distress.  HENT:  Mouth/Throat: Oropharynx is clear and moist. No oropharyngeal exudate.  Neck: Neck supple.  Cardiovascular: Normal rate, regular rhythm and normal heart sounds.   No murmur heard. Pulmonary/Chest: Effort normal and breath sounds normal. No respiratory distress. He has no wheezes. He has no rales. He exhibits no tenderness.  Abdominal: Soft. Bowel sounds are normal. He exhibits no distension. There is no tenderness. There is no  rebound and no guarding.  Musculoskeletal:       Cervical back: He exhibits decreased range of motion. He exhibits no tenderness, no pain and no spasm.       Back:  Lymphadenopathy:    He has no cervical adenopathy.  Neurological: He is alert and oriented to person, place, and time. He has normal strength and normal reflexes. No cranial nerve deficit.  Skin: He is not diaphoretic.  Vitals reviewed.         Assessment & Plan:  Atypical chest pain - Plan: DG Chest 2 View Cervical radiculopathy Patient has atypical posterior chest wall pain. Wife is very concerned that this could be his heart. I tried to calm her fears. I will check a chest x-ray to rule out any pulmonary source although the patient is  asymptomatic. I believe this is most likely due to cervical radiculopathy. I believe he has a pinched nerve in his neck referring pain down around the shoulder blade. I will start the patient on a prednisone taper pack. If symptoms worsen, proceed with imaging of the cervical and thoracic spine to further evaluate

## 2017-02-08 ENCOUNTER — Telehealth: Payer: Self-pay | Admitting: Family Medicine

## 2017-02-08 NOTE — Telephone Encounter (Signed)
Pt has finished prednisone and is still having pain, was told to call and let us know if it did not help, please advise.

## 2017-02-11 ENCOUNTER — Other Ambulatory Visit: Payer: Self-pay | Admitting: Family Medicine

## 2017-02-11 DIAGNOSIS — Z79899 Other long term (current) drug therapy: Secondary | ICD-10-CM

## 2017-02-11 DIAGNOSIS — E1165 Type 2 diabetes mellitus with hyperglycemia: Principal | ICD-10-CM

## 2017-02-11 DIAGNOSIS — IMO0001 Reserved for inherently not codable concepts without codable children: Secondary | ICD-10-CM

## 2017-02-11 DIAGNOSIS — E785 Hyperlipidemia, unspecified: Secondary | ICD-10-CM

## 2017-02-11 NOTE — Telephone Encounter (Signed)
Called patient.  Pain still sounds like cervical radiculopathy.  Recommended repeat MRI of c spine and if nerve impingement is seen, ESI.  He will consider and call me back if he desires to proceed.

## 2017-02-15 ENCOUNTER — Other Ambulatory Visit: Payer: Medicare HMO

## 2017-02-15 DIAGNOSIS — E785 Hyperlipidemia, unspecified: Secondary | ICD-10-CM

## 2017-02-15 DIAGNOSIS — E1165 Type 2 diabetes mellitus with hyperglycemia: Secondary | ICD-10-CM | POA: Diagnosis not present

## 2017-02-15 DIAGNOSIS — IMO0001 Reserved for inherently not codable concepts without codable children: Secondary | ICD-10-CM

## 2017-02-15 DIAGNOSIS — Z79899 Other long term (current) drug therapy: Secondary | ICD-10-CM | POA: Diagnosis not present

## 2017-02-15 LAB — CBC WITH DIFFERENTIAL/PLATELET
Basophils Absolute: 0 cells/uL (ref 0–200)
Basophils Relative: 0 %
EOS ABS: 207 {cells}/uL (ref 15–500)
Eosinophils Relative: 3 %
HEMATOCRIT: 42.5 % (ref 38.5–50.0)
Hemoglobin: 13.9 g/dL (ref 13.0–17.0)
Lymphocytes Relative: 30 %
Lymphs Abs: 2070 cells/uL (ref 850–3900)
MCH: 30.9 pg (ref 27.0–33.0)
MCHC: 32.7 g/dL (ref 32.0–36.0)
MCV: 94.4 fL (ref 80.0–100.0)
MONO ABS: 759 {cells}/uL (ref 200–950)
MPV: 10.1 fL (ref 7.5–12.5)
Monocytes Relative: 11 %
NEUTROS PCT: 56 %
Neutro Abs: 3864 cells/uL (ref 1500–7800)
Platelets: 265 10*3/uL (ref 140–400)
RBC: 4.5 MIL/uL (ref 4.20–5.80)
RDW: 13.8 % (ref 11.0–15.0)
WBC: 6.9 10*3/uL (ref 3.8–10.8)

## 2017-02-16 LAB — COMPREHENSIVE METABOLIC PANEL
ALBUMIN: 4.4 g/dL (ref 3.6–5.1)
ALK PHOS: 72 U/L (ref 40–115)
ALT: 14 U/L (ref 9–46)
AST: 18 U/L (ref 10–35)
BUN: 15 mg/dL (ref 7–25)
CALCIUM: 9.9 mg/dL (ref 8.6–10.3)
CO2: 26 mmol/L (ref 20–32)
Chloride: 100 mmol/L (ref 98–110)
Creat: 0.86 mg/dL (ref 0.70–1.25)
GLUCOSE: 143 mg/dL — AB (ref 70–99)
POTASSIUM: 5.4 mmol/L — AB (ref 3.5–5.3)
Sodium: 137 mmol/L (ref 135–146)
Total Bilirubin: 0.5 mg/dL (ref 0.2–1.2)
Total Protein: 6.7 g/dL (ref 6.1–8.1)

## 2017-02-16 LAB — LIPID PANEL
CHOLESTEROL: 163 mg/dL (ref <200)
HDL: 63 mg/dL (ref >40)
LDL Cholesterol: 86 mg/dL (ref <100)
TRIGLYCERIDES: 72 mg/dL (ref <150)
Total CHOL/HDL Ratio: 2.6 Ratio (ref <5.0)
VLDL: 14 mg/dL (ref <30)

## 2017-02-16 LAB — HEMOGLOBIN A1C
Hgb A1c MFr Bld: 6.9 % — ABNORMAL HIGH (ref <5.7)
MEAN PLASMA GLUCOSE: 151 mg/dL

## 2017-02-17 ENCOUNTER — Encounter: Payer: Self-pay | Admitting: Family Medicine

## 2017-02-17 ENCOUNTER — Ambulatory Visit (INDEPENDENT_AMBULATORY_CARE_PROVIDER_SITE_OTHER): Payer: Medicare HMO | Admitting: Family Medicine

## 2017-02-17 VITALS — BP 126/84 | HR 62 | Temp 98.3°F | Resp 14 | Ht 69.5 in | Wt 165.0 lb

## 2017-02-17 DIAGNOSIS — IMO0001 Reserved for inherently not codable concepts without codable children: Secondary | ICD-10-CM

## 2017-02-17 DIAGNOSIS — E1165 Type 2 diabetes mellitus with hyperglycemia: Secondary | ICD-10-CM

## 2017-02-17 NOTE — Progress Notes (Signed)
Subjective:    Patient ID: Todd Lawrence, male    DOB: 11-10-1950, 66 y.o.   MRN: [id number]  HPI  Here to follow up for his diabetes.  Hemoglobin A1c has fallen from 8.1-6.9. He is tolerating metformin without diarrhea. His LDL cholesterol has fallen below 100 and his HDL cholesterol remains above 60. He denies any hypoglycemic episodes. He denies any polyuria, polydipsia, or blurry vision.  Most recent labs are below: Appointment on 02/15/2017  Component Date Value Ref Range Status  . WBC 02/15/2017 6.9  3.8 - 10.8 K/uL Final  . RBC 02/15/2017 4.50  4.20 - 5.80 MIL/uL Final  . Hemoglobin 02/15/2017 13.9  13.0 - 17.0 g/dL Final  . HCT 02/15/2017 42.5  38.5 - 50.0 % Final  . MCV 02/15/2017 94.4  80.0 - 100.0 fL Final  . MCH 02/15/2017 30.9  27.0 - 33.0 pg Final  . MCHC 02/15/2017 32.7  32.0 - 36.0 g/dL Final  . RDW 02/15/2017 13.8  11.0 - 15.0 % Final  . Platelets 02/15/2017 265  140 - 400 K/uL Final  . MPV 02/15/2017 10.1  7.5 - 12.5 fL Final  . Neutro Abs 02/15/2017 3864  1,500 - 7,800 cells/uL Final  . Lymphs Abs 02/15/2017 2070  850 - 3,900 cells/uL Final  . Monocytes Absolute 02/15/2017 759  200 - 950 cells/uL Final  . Eosinophils Absolute 02/15/2017 207  15 - 500 cells/uL Final  . Basophils Absolute 02/15/2017 0  0 - 200 cells/uL Final  . Neutrophils Relative % 02/15/2017 56  % Final  . Lymphocytes Relative 02/15/2017 30  % Final  . Monocytes Relative 02/15/2017 11  % Final  . Eosinophils Relative 02/15/2017 3  % Final  . Basophils Relative 02/15/2017 0  % Final  . Smear Review 02/15/2017 Criteria for review not met   Final  . Sodium 02/15/2017 137  135 - 146 mmol/L Final  . Potassium 02/15/2017 5.4* 3.5 - 5.3 mmol/L Final   No visible hemolysis.  . Chloride 02/15/2017 100  98 - 110 mmol/L Final  . CO2 02/15/2017 26  20 - 32 mmol/L Final   Comment: ** Please note change in reference range(s). **     . Glucose, Bld 02/15/2017 143* 70 - 99 mg/dL Final  . BUN 02/15/2017  15  7 - 25 mg/dL Final  . Creat 02/15/2017 0.86  0.70 - 1.25 mg/dL Final   Comment:   For patients > or = 68 years of age: The upper reference limit for Creatinine is approximately 13% higher for people identified as African-American.     . Total Bilirubin 02/15/2017 0.5  0.2 - 1.2 mg/dL Final  . Alkaline Phosphatase 02/15/2017 72  40 - 115 U/L Final  . AST 02/15/2017 18  10 - 35 U/L Final  . ALT 02/15/2017 14  9 - 46 U/L Final  . Total Protein 02/15/2017 6.7  6.1 - 8.1 g/dL Final  . Albumin 02/15/2017 4.4  3.6 - 5.1 g/dL Final  . Calcium 02/15/2017 9.9  8.6 - 10.3 mg/dL Final  . Hgb A1c MFr Bld 02/15/2017 6.9* <5.7 % Final   Comment:   For someone without known diabetes, a hemoglobin A1c value of 6.5% or greater indicates that they may have diabetes and this should be confirmed with a follow-up test.   For someone with known diabetes, a value <7% indicates that their diabetes is well controlled and a value greater than or equal to 7% indicates suboptimal control. A1c targets  should be individualized based on duration of diabetes, age, comorbid conditions, and other considerations.   Currently, no consensus exists for use of hemoglobin A1c for diagnosis of diabetes for children.     . Mean Plasma Glucose 02/15/2017 151  mg/dL Final  . Cholesterol 02/15/2017 163  <200 mg/dL Final  . Triglycerides 02/15/2017 72  <150 mg/dL Final  . HDL 02/15/2017 63  >40 mg/dL Final  . Total CHOL/HDL Ratio 02/15/2017 2.6  <5.0 Ratio Final  . VLDL 02/15/2017 14  <30 mg/dL Final  . LDL Cholesterol 02/15/2017 86  <100 mg/dL Final    Past Medical History:  Diagnosis Date  . Acute meniscal tear of left knee   . DDD (degenerative disc disease), lumbar    has received ESI x 3  . Diabetes mellitus without complication ([place])   . Hyperlipidemia   . Left rotator cuff tear    Past Surgical History:  Procedure Laterality Date  . CERVICAL FUSION    . FOOT SURGERY    . KNEE ARTHROSCOPY    . THUMB  AMPUTATION     left after table saw accident    Current Outpatient Prescriptions on File Prior to Visit  Medication Sig Dispense Refill  . aspirin 81 MG tablet Take 81 mg by mouth daily.    Marland Kitchen atorvastatin (LIPITOR) 20 MG tablet TAKE 1 TABLET EVERY DAY 90 tablet 1  . metFORMIN (GLUCOPHAGE) 500 MG tablet Take 1 tablet (500 mg total) by mouth 2 (two) times daily with a meal. 60 tablet 3  . Misc Natural Products (GLUCOSAMINE CHOND COMPLEX/MSM PO) Take 1 tablet by mouth daily.     . Multiple Vitamin (MULTIVITAMIN WITH MINERALS) TABS tablet Take 1 tablet by mouth daily.    Marland Kitchen omeprazole (PRILOSEC) 40 MG capsule Take 1 capsule (40 mg total) by mouth daily. 30 capsule 3  . predniSONE (DELTASONE) 20 MG tablet 3 tabs poqday 1-2, 2 tabs poqday 3-4, 1 tab poqday 5-6 12 tablet 0   No current facility-administered medications on file prior to visit.    No Known Allergies Social History   Social History  . Marital status: Married    Spouse name: N/A  . Number of children: N/A  . Years of education: N/A   Occupational History  . Not on file.   Social History Main Topics  . Smoking status: Never Smoker  . Smokeless tobacco: Never Used  . Alcohol use No  . Drug use: No  . 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 Not on file   Social History Narrative  . No narrative on file   Family History  Problem Relation Age of Onset  . Heart disease Father   . Asthma Sister   . Arthritis Sister      Review of Systems  All other systems reviewed and are negative.      Objective:   Physical Exam  Constitutional: He is oriented to person, place, and time. He appears well-developed and well-nourished. No distress.  Cardiovascular: Normal rate, regular rhythm, normal heart sounds and intact distal pulses.  Exam reveals no gallop and no friction rub.   No murmur heard. Pulmonary/Chest: Effort normal and breath sounds  normal. No respiratory distress. He has no wheezes. He has no rales. He exhibits no tenderness.  Abdominal: Soft. Bowel sounds are normal. He exhibits no distension and no mass. There is no tenderness. There  is no rebound and no guarding.  Neurological: He is alert and oriented to person, place, and time. He has normal reflexes. No cranial nerve deficit. He exhibits normal muscle tone. Coordination normal.  Skin: Skin is warm. No rash noted. He is not diaphoretic. No erythema. No pallor.  Psychiatric: He has a normal mood and affect.  Vitals reviewed.         Assessment & Plan:  Uncontrolled type 2 diabetes mellitus without complication, without long-term current use of insulin ([facility])  Sugars are now adequately controlled. Exercising regularly, watching his diet.  LDL is at goal. Recheck in 6 months and get microalbumin at that time.

## 2017-02-28 DIAGNOSIS — R69 Illness, unspecified: Secondary | ICD-10-CM | POA: Diagnosis not present

## 2017-03-10 ENCOUNTER — Other Ambulatory Visit: Payer: Self-pay | Admitting: Family Medicine

## 2017-03-15 DIAGNOSIS — E119 Type 2 diabetes mellitus without complications: Secondary | ICD-10-CM | POA: Diagnosis not present

## 2017-03-15 DIAGNOSIS — H11003 Unspecified pterygium of eye, bilateral: Secondary | ICD-10-CM | POA: Diagnosis not present

## 2017-04-07 DIAGNOSIS — H524 Presbyopia: Secondary | ICD-10-CM | POA: Diagnosis not present

## 2017-06-17 ENCOUNTER — Other Ambulatory Visit: Payer: Self-pay | Admitting: Family Medicine

## 2017-06-20 ENCOUNTER — Other Ambulatory Visit: Payer: Self-pay | Admitting: Family Medicine

## 2017-06-22 NOTE — Telephone Encounter (Signed)
Medication refilled per protocol. 

## 2017-06-23 DIAGNOSIS — M19012 Primary osteoarthritis, left shoulder: Secondary | ICD-10-CM | POA: Diagnosis not present

## 2017-08-11 ENCOUNTER — Encounter: Payer: Self-pay | Admitting: Family Medicine

## 2017-08-11 ENCOUNTER — Ambulatory Visit (INDEPENDENT_AMBULATORY_CARE_PROVIDER_SITE_OTHER): Payer: Medicare HMO | Admitting: Family Medicine

## 2017-08-11 VITALS — BP 110/76 | HR 76 | Temp 98.1°F | Resp 16 | Ht 69.5 in | Wt 168.0 lb

## 2017-08-11 DIAGNOSIS — D485 Neoplasm of uncertain behavior of skin: Secondary | ICD-10-CM

## 2017-08-11 DIAGNOSIS — D489 Neoplasm of uncertain behavior, unspecified: Secondary | ICD-10-CM | POA: Diagnosis not present

## 2017-08-11 DIAGNOSIS — B079 Viral wart, unspecified: Secondary | ICD-10-CM | POA: Diagnosis not present

## 2017-08-11 NOTE — Progress Notes (Signed)
   Subjective:    Patient ID: Todd Lawrence, male    DOB: April 20, 1951, 67 y.o.   MRN: [id number]  HPI  Patient presents today with an atypical lesion in the center of his chest over the sternum.  It is 6 mm in diameter.  It is a erythematous papule with wartlike features bright red painful and irritated in appearance.  Patient states that is been growing recently. Past Medical History:  Diagnosis Date  . Acute meniscal tear of left knee   . DDD (degenerative disc disease), lumbar    has received ESI x 3  . Diabetes mellitus without complication ([place])   . Hyperlipidemia   . Left rotator cuff tear    Past Surgical History:  Procedure Laterality Date  . CERVICAL FUSION    . FOOT SURGERY    . KNEE ARTHROSCOPY    . 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 aspirin 81 MG tablet Take 81 mg by mouth daily.    Marland Kitchen atorvastatin (LIPITOR) 20 MG tablet TAKE 1 TABLET EVERY DAY 90 tablet 1  . metFORMIN (GLUCOPHAGE) 500 MG tablet TAKE 1 TABLET BY MOUTH TWICE A DAY WITH A MEAL 180 tablet 0  . Misc Natural Products (GLUCOSAMINE CHOND COMPLEX/MSM PO) Take 1 tablet by mouth daily.     . Multiple Vitamin (MULTIVITAMIN WITH MINERALS) TABS tablet Take 1 tablet by mouth daily.     No current facility-administered medications on file prior to visit.    No Known Allergies Social History   Socioeconomic History  . Marital status: Married    Spouse name: Not on file  . Number of children: Not on file  . Years of education: Not on file  . Highest education level: Not on file  Social Needs  . Financial resource strain: Not on file  . Food insecurity - worry: Not on file  . Food insecurity - inability: Not on file  . Transportation needs - medical: Not on file  . Transportation needs - non-medical: Not on file  Occupational History  . Not on file  Tobacco Use  . Smoking status: Never Smoker  .  Smokeless tobacco: Never Used  Substance and Sexual Activity  . Alcohol use: No  . Drug use: No  . 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 Not on file  Social History Narrative  . Not on file      Review of Systems  All other systems reviewed and are negative.      Objective:   Physical Exam  Constitutional: He appears well-developed and well-nourished.  Cardiovascular: Normal rate and regular rhythm.  Pulmonary/Chest: Effort normal and breath sounds normal.  Vitals reviewed.   6 mm verruciform red scaly papule.      Assessment & Plan:  Neoplasm of uncertain behavior - Plan: Pathology Report  Lesion was anesthetized with 0.1% lidocaine with epinephrine.  It was prepped and draped in sterile fashion it was excised using an elliptical method down to the underlying fascia.  Lesion was sent to pathology in a labeled container.  Skin edges were approximated with 3 interrupted 3-0 Ethilon sutures.  Wound care was discussed.  Stitches out in 7 days

## 2017-08-15 LAB — PATHOLOGY

## 2017-08-15 LAB — TISSUE SPECIMEN

## 2017-08-18 ENCOUNTER — Ambulatory Visit (INDEPENDENT_AMBULATORY_CARE_PROVIDER_SITE_OTHER): Payer: Medicare HMO | Admitting: Family Medicine

## 2017-08-18 VITALS — BP 142/82 | HR 80 | Temp 98.2°F | Resp 16 | Ht 69.5 in | Wt 168.0 lb

## 2017-08-18 DIAGNOSIS — Z4802 Encounter for removal of sutures: Secondary | ICD-10-CM

## 2017-08-18 NOTE — Progress Notes (Signed)
Subjective:    Patient ID: Todd Lawrence, male    DOB: 31-Aug-1950, 67 y.o.   MRN: [id number]  HPI 08/11/17 Patient presents today with an atypical lesion in the center of his chest over the sternum.  It is 6 mm in diameter.  It is a erythematous papule with wartlike features bright red painful and irritated in appearance.  Patient states that is been growing recently.  At that time, my plan was: Lesion was anesthetized with 0.1% lidocaine with epinephrine.  It was prepped and draped in sterile fashion it was excised using an elliptical method down to the underlying fascia.  Lesion was sent to pathology in a labeled container.  Skin edges were approximated with 3 interrupted 3-0 Ethilon sutures.  Wound care was discussed.  Stitches out in 7 days  08/18/17 Here today for suture removal Past Medical History:  Diagnosis Date  . Acute meniscal tear of left knee   . DDD (degenerative disc disease), lumbar    has received ESI x 3  . Diabetes mellitus without complication ([place])   . Hyperlipidemia   . Left rotator cuff tear    Past Surgical History:  Procedure Laterality Date  . CERVICAL FUSION    . FOOT SURGERY    . KNEE ARTHROSCOPY    . 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 aspirin 81 MG tablet Take 81 mg by mouth daily.    Marland Kitchen atorvastatin (LIPITOR) 20 MG tablet TAKE 1 TABLET EVERY DAY 90 tablet 1  . metFORMIN (GLUCOPHAGE) 500 MG tablet TAKE 1 TABLET BY MOUTH TWICE A DAY WITH A MEAL 180 tablet 0  . Misc Natural Products (GLUCOSAMINE CHOND COMPLEX/MSM PO) Take 1 tablet by mouth daily.     . Multiple Vitamin (MULTIVITAMIN WITH MINERALS) TABS tablet Take 1 tablet by mouth daily.     No current facility-administered medications on file prior to visit.    No Known Allergies Social History   Socioeconomic History  . Marital status: Married    Spouse name: Not on file  . Number of  children: Not on file  . Years of education: Not on file  . Highest education level: Not on file  Social Needs  . Financial resource strain: Not on file  . Food insecurity - worry: Not on file  . Food insecurity - inability: Not on file  . Transportation needs - medical: Not on file  . Transportation needs - non-medical: Not on file  Occupational History  . Not on file  Tobacco Use  . Smoking status: Never Smoker  . Smokeless tobacco: Never Used  Substance and Sexual Activity  . Alcohol use: No  . Drug use: No  . 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 Not on file  Social History Narrative  . Not on file      Review of Systems  All other systems reviewed and are negative.      Objective:   Physical Exam  Constitutional: He appears well-developed and well-nourished.  Cardiovascular: Normal rate and regular rhythm.  Pulmonary/Chest: Effort normal and breath sounds normal.  Vitals reviewed.   Well healed.        Assessment & Plan:  Visit for suture removal 3 sutures removed without difficulty.  Biopsy confirmed verruca vulgaris.  Margins  are clear.

## 2017-09-12 ENCOUNTER — Other Ambulatory Visit: Payer: Self-pay | Admitting: Family Medicine

## 2017-09-12 ENCOUNTER — Other Ambulatory Visit: Payer: Medicare HMO

## 2017-09-12 DIAGNOSIS — R69 Illness, unspecified: Secondary | ICD-10-CM | POA: Diagnosis not present

## 2017-09-12 DIAGNOSIS — R7303 Prediabetes: Secondary | ICD-10-CM

## 2017-09-12 DIAGNOSIS — E785 Hyperlipidemia, unspecified: Secondary | ICD-10-CM | POA: Diagnosis not present

## 2017-09-12 DIAGNOSIS — Z79899 Other long term (current) drug therapy: Secondary | ICD-10-CM

## 2017-09-13 ENCOUNTER — Encounter: Payer: Self-pay | Admitting: Family Medicine

## 2017-09-13 ENCOUNTER — Ambulatory Visit (INDEPENDENT_AMBULATORY_CARE_PROVIDER_SITE_OTHER): Payer: Medicare HMO | Admitting: Family Medicine

## 2017-09-13 VITALS — BP 130/82 | HR 75 | Temp 97.6°F | Resp 14 | Wt 170.0 lb

## 2017-09-13 DIAGNOSIS — E119 Type 2 diabetes mellitus without complications: Secondary | ICD-10-CM | POA: Diagnosis not present

## 2017-09-13 DIAGNOSIS — E7800 Pure hypercholesterolemia, unspecified: Secondary | ICD-10-CM | POA: Diagnosis not present

## 2017-09-13 LAB — COMPREHENSIVE METABOLIC PANEL
AG Ratio: 2 (calc) (ref 1.0–2.5)
ALKALINE PHOSPHATASE (APISO): 62 U/L (ref 40–115)
ALT: 16 U/L (ref 9–46)
AST: 21 U/L (ref 10–35)
Albumin: 4.3 g/dL (ref 3.6–5.1)
BUN: 13 mg/dL (ref 7–25)
CO2: 29 mmol/L (ref 20–32)
CREATININE: 0.83 mg/dL (ref 0.70–1.25)
Calcium: 9.5 mg/dL (ref 8.6–10.3)
Chloride: 101 mmol/L (ref 98–110)
GLUCOSE: 106 mg/dL — AB (ref 65–99)
Globulin: 2.1 g/dL (calc) (ref 1.9–3.7)
Potassium: 4.8 mmol/L (ref 3.5–5.3)
Sodium: 138 mmol/L (ref 135–146)
Total Bilirubin: 0.4 mg/dL (ref 0.2–1.2)
Total Protein: 6.4 g/dL (ref 6.1–8.1)

## 2017-09-13 LAB — LIPID PANEL
CHOLESTEROL: 164 mg/dL (ref <200)
HDL: 66 mg/dL (ref >40)
LDL CHOLESTEROL (CALC): 85 mg/dL
Non-HDL Cholesterol (Calc): 98 mg/dL (calc) (ref <130)
TRIGLYCERIDES: 57 mg/dL (ref <150)
Total CHOL/HDL Ratio: 2.5 (calc) (ref <5.0)

## 2017-09-13 LAB — CBC WITH DIFFERENTIAL/PLATELET
Basophils Absolute: 51 cells/uL (ref 0–200)
Basophils Relative: 0.9 %
EOS ABS: 211 {cells}/uL (ref 15–500)
Eosinophils Relative: 3.7 %
HCT: 39 % (ref 38.5–50.0)
Hemoglobin: 13.2 g/dL (ref 13.2–17.1)
Lymphs Abs: 1533 cells/uL (ref 850–3900)
MCH: 31 pg (ref 27.0–33.0)
MCHC: 33.8 g/dL (ref 32.0–36.0)
MCV: 91.5 fL (ref 80.0–100.0)
MONOS PCT: 9.4 %
MPV: 10.6 fL (ref 7.5–12.5)
NEUTROS PCT: 59.1 %
Neutro Abs: 3369 cells/uL (ref 1500–7800)
PLATELETS: 267 10*3/uL (ref 140–400)
RBC: 4.26 10*6/uL (ref 4.20–5.80)
RDW: 12.9 % (ref 11.0–15.0)
TOTAL LYMPHOCYTE: 26.9 %
WBC: 5.7 10*3/uL (ref 3.8–10.8)
WBCMIX: 536 {cells}/uL (ref 200–950)

## 2017-09-13 LAB — HEMOGLOBIN A1C
Hgb A1c MFr Bld: 6.8 % of total Hgb — ABNORMAL HIGH (ref <5.7)
MEAN PLASMA GLUCOSE: 148 (calc)
eAG (mmol/L): 8.2 (calc)

## 2017-09-13 NOTE — Progress Notes (Signed)
Subjective:    Patient ID: Todd Lawrence, male    DOB: [birth year], 67 y.o.   MRN: [id number]  HPI Patient is exercising every day. He denies any episodes of hypoglycemia. He denies any polyuria, polydipsia, or blurry vision. He denies any chest pain shortness of breath or dyspnea on exertion. He denies any myalgias or right upper quadrant pain. He is overdue for a flu shot which he politely declines. He is overdue for hepatitis C screening but he politely declines this as well. His PSA is due after April 24. His colonoscopy was performed in September 2009 and is due again this year but he defers allowing me to schedule this at the present time. The remainder of his review of systems is negative. His immunization list is below: Immunization History  Administered Date(s) Administered  . Pneumococcal Conjugate-13 11/16/2016  . Pneumococcal Polysaccharide-23 01/31/2015  . Tdap 09/25/2013, 08/09/2015  . Zoster 11/06/2013   Most recent labs are below: Appointment on 09/12/2017  Component Date Value Ref Range Status  . WBC 09/12/2017 5.7  3.8 - 10.8 Thousand/uL Final  . RBC 09/12/2017 4.26  4.20 - 5.80 Million/uL Final  . Hemoglobin 09/12/2017 13.2  13.2 - 17.1 g/dL Final  . HCT 09/12/2017 39.0  38.5 - 50.0 % Final  . MCV 09/12/2017 91.5  80.0 - 100.0 fL Final  . MCH 09/12/2017 31.0  27.0 - 33.0 pg Final  . MCHC 09/12/2017 33.8  32.0 - 36.0 g/dL Final  . RDW 09/12/2017 12.9  11.0 - 15.0 % Final  . Platelets 09/12/2017 267  140 - 400 Thousand/uL Final  . MPV 09/12/2017 10.6  7.5 - 12.5 fL Final  . Neutro Abs 09/12/2017 3,369  1,500 - 7,800 cells/uL Final  . Lymphs Abs 09/12/2017 1,533  850 - 3,900 cells/uL Final  . WBC mixed population 09/12/2017 536  200 - 950 cells/uL Final  . Eosinophils Absolute 09/12/2017 211  15 - 500 cells/uL Final  . Basophils Absolute 09/12/2017 51  0 - 200 cells/uL Final  . Neutrophils Relative % 09/12/2017 59.1  % Final  . Total Lymphocyte 09/12/2017 26.9  %  Final  . Monocytes Relative 09/12/2017 9.4  % Final  . Eosinophils Relative 09/12/2017 3.7  % Final  . Basophils Relative 09/12/2017 0.9  % Final  . Glucose, Bld 09/12/2017 106* 65 - 99 mg/dL Final   Comment: .            Fasting reference interval . For someone without known diabetes, a glucose value between 100 and 125 mg/dL is consistent with prediabetes and should be confirmed with a follow-up test. .   . BUN 09/12/2017 13  7 - 25 mg/dL Final  . Creat 09/12/2017 0.83  0.70 - 1.25 mg/dL Final   Comment: For patients >63 years of age, the reference limit for Creatinine is approximately 13% higher for people identified as African-American. .   Havery Moros Ratio 52/84/1324 NOT APPLICABLE  6 - 22 (calc) Final  . Sodium 09/12/2017 138  135 - 146 mmol/L Final  . Potassium 09/12/2017 4.8  3.5 - 5.3 mmol/L Final  . Chloride 09/12/2017 101  98 - 110 mmol/L Final  . CO2 09/12/2017 29  20 - 32 mmol/L Final  . Calcium 09/12/2017 9.5  8.6 - 10.3 mg/dL Final  . Total Protein 09/12/2017 6.4  6.1 - 8.1 g/dL Final  . Albumin 09/12/2017 4.3  3.6 - 5.1 g/dL Final  . Globulin 09/12/2017 2.1  1.9 - 3.7 g/dL (  calc) Final  . AG Ratio 09/12/2017 2.0  1.0 - 2.5 (calc) Final  . Total Bilirubin 09/12/2017 0.4  0.2 - 1.2 mg/dL Final  . Alkaline phosphatase (APISO) 09/12/2017 62  40 - 115 U/L Final  . AST 09/12/2017 21  10 - 35 U/L Final  . ALT 09/12/2017 16  9 - 46 U/L Final  . Hgb A1c MFr Bld 09/12/2017 6.8* <5.7 % of total Hgb Final   Comment: For someone without known diabetes, a hemoglobin A1c value of 6.5% or greater indicates that they may have  diabetes and this should be confirmed with a follow-up  test. . For someone with known diabetes, a value <7% indicates  that their diabetes is well controlled and a value  greater than or equal to 7% indicates suboptimal  control. A1c targets should be individualized based on  duration of diabetes, age, comorbid conditions, and  other  considerations. . Currently, no consensus exists regarding use of hemoglobin A1c for diagnosis of diabetes for children. .   . Mean Plasma Glucose 09/12/2017 148  (calc) Final  . eAG (mmol/L) 09/12/2017 8.2  (calc) Final  . Cholesterol 09/12/2017 164  <200 mg/dL Final  . HDL 09/12/2017 66  >40 mg/dL Final  . Triglycerides 09/12/2017 57  <150 mg/dL Final  . LDL Cholesterol (Calc) 09/12/2017 85  mg/dL (calc) Final   Comment: Reference range: <100 . Desirable range <100 mg/dL for primary prevention;   <70 mg/dL for patients with CHD or diabetic patients  with > or = 2 CHD risk factors. Marland Kitchen LDL-C is now calculated using the Martin-Hopkins  calculation, which is a validated novel method providing  better accuracy than the Friedewald equation in the  estimation of LDL-C.  Cresenciano Genre et al. Annamaria Helling. 9983;382(50): 2061-2068  (http://education.[facility].com/faq/FAQ164)   . Total CHOL/HDL Ratio 09/12/2017 2.5  <5.0 (calc) Final  . Non-HDL Cholesterol (Calc) 09/12/2017 98  <130 mg/dL (calc) Final   Comment: For patients with diabetes plus 1 major ASCVD risk  factor, treating to a non-HDL-C goal of <100 mg/dL  (LDL-C of <70 mg/dL) is considered a therapeutic  option.     Past Medical History:  Diagnosis Date  . Acute meniscal tear of left knee   . DDD (degenerative disc disease), lumbar    has received ESI x 3  . Diabetes mellitus without complication ([place])   . Hyperlipidemia   . Left rotator cuff tear    Past Surgical History:  Procedure Laterality Date  . CERVICAL FUSION    . FOOT SURGERY    . KNEE ARTHROSCOPY    . 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    Current Outpatient Medications on File Prior to Visit  Medication Sig Dispense Refill  . aspirin 81 MG tablet Take 81 mg by mouth daily.    Marland Kitchen atorvastatin (LIPITOR) 20 MG tablet TAKE 1 TABLET EVERY DAY 90 tablet 1  . metFORMIN (GLUCOPHAGE) 500 MG tablet TAKE 1 TABLET BY MOUTH TWICE A DAY WITH A MEAL 180  tablet 0  . Misc Natural Products (GLUCOSAMINE CHOND COMPLEX/MSM PO) Take 1 tablet by mouth daily.     . Multiple Vitamin (MULTIVITAMIN WITH MINERALS) TABS tablet Take 1 tablet by mouth daily.     No current facility-administered medications on file prior to visit.    No Known Allergies Social History   Socioeconomic History  . Marital status: Married    Spouse name: Not on file  . Number of children:  Not on file  . Years of education: Not on file  . Highest education level: Not on file  Social Needs  . Financial resource strain: Not on file  . Food insecurity - worry: Not on file  . Food insecurity - inability: Not on file  . Transportation needs - medical: Not on file  . Transportation needs - non-medical: Not on file  Occupational History  . Not on file  Tobacco Use  . Smoking status: Never Smoker  . Smokeless tobacco: Never Used  Substance and Sexual Activity  . Alcohol use: No  . Drug use: No  . 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 Not on file  Social History Narrative  . Not on file   Family History  Problem Relation Age of Onset  . Heart disease Father   . Asthma Sister   . Arthritis Sister      Review of Systems  All other systems reviewed and are negative.      Objective:   Physical Exam  Constitutional: He is oriented to person, place, and time. He appears well-developed and well-nourished. No distress.  Cardiovascular: Normal rate, regular rhythm, normal heart sounds and intact distal pulses. Exam reveals no gallop and no friction rub.  No murmur heard. Pulmonary/Chest: Effort normal and breath sounds normal. No respiratory distress. He has no wheezes. He has no rales. He exhibits no tenderness.  Abdominal: Soft. Bowel sounds are normal. He exhibits no distension and no mass. There is no tenderness. There is no rebound and no guarding.  Neurological: He is alert and oriented  to person, place, and time. He has normal reflexes. No cranial nerve deficit. He exhibits normal muscle tone. Coordination normal.  Skin: Skin is warm. No rash noted. He is not diaphoretic. No erythema. No pallor.  Psychiatric: He has a normal mood and affect.  Vitals reviewed.         Assessment & Plan:  Pure hypercholesterolemia  Controlled type 2 diabetes mellitus without complication, without long-term current use of insulin (HCC) - Plan: Microalbumin, urine  Patient's blood pressure is outstanding at 130/82. His hemoglobin A1c is adequately controlled at 6.8. Continue to work on diet and exercise and I would recheck a hemoglobin A1c in 6 months. Immunizations are up-to-date. He declines his flu shot today. He declines hepatitis C screening. He defers his colonoscopy at the present time and will call me back when he is ready for me to schedule this. His PSA is not due until after April 24. His LDL cholesterol is well below the goal of 100. Therefore I will recheck the patient in 6 months. I recommended a urine microalbumin today however the patient left before he provided a urine sample inadvertently

## 2017-10-02 ENCOUNTER — Other Ambulatory Visit: Payer: Self-pay | Admitting: Family Medicine

## 2017-10-11 DIAGNOSIS — E119 Type 2 diabetes mellitus without complications: Secondary | ICD-10-CM | POA: Insufficient documentation

## 2017-11-03 ENCOUNTER — Ambulatory Visit
Admission: RE | Admit: 2017-11-03 | Discharge: 2017-11-03 | Disposition: A | Discharge status: Home / Self Care | Payer: Medicare HMO | Source: Ambulatory Visit | Attending: Family Medicine | Admitting: Family Medicine

## 2017-11-03 ENCOUNTER — Encounter: Payer: Self-pay | Admitting: Family Medicine

## 2017-11-03 ENCOUNTER — Ambulatory Visit (INDEPENDENT_AMBULATORY_CARE_PROVIDER_SITE_OTHER): Payer: Medicare HMO | Admitting: Family Medicine

## 2017-11-03 ENCOUNTER — Other Ambulatory Visit: Payer: Self-pay

## 2017-11-03 VITALS — BP 132/76 | HR 82 | Temp 98.3°F | Resp 14 | Ht 69.5 in | Wt 168.0 lb

## 2017-11-03 DIAGNOSIS — M79602 Pain in left arm: Secondary | ICD-10-CM | POA: Diagnosis not present

## 2017-11-03 DIAGNOSIS — M549 Dorsalgia, unspecified: Secondary | ICD-10-CM

## 2017-11-03 DIAGNOSIS — M542 Cervicalgia: Secondary | ICD-10-CM | POA: Diagnosis not present

## 2017-11-03 MED ORDER — CYCLOBENZAPRINE HCL 10 MG PO TABS: 10.0000 mg | ORAL_TABLET | Freq: Three times a day (TID) | ORAL | 0 refills | Status: DC | PRN

## 2017-11-03 MED ORDER — KETOROLAC TROMETHAMINE 60 MG/2ML IM SOLN
60.0000 mg | Freq: Once | INTRAMUSCULAR | Status: AC
  Administered 2017-11-03: 60 mg via INTRAMUSCULAR

## 2017-11-03 NOTE — Progress Notes (Signed)
Patient ID: Todd Lawrence, male    DOB: 18-May-1951, 67 y.o.   MRN: [id number]  PCP: [name], MD  Chief Complaint  Patient presents with  . L Sided Back Pain    x4 days- states that he has shoulder blade pain with some swelling under scapula and tingling and numbness down L arm  . R Epicondylitis    Subjective:   Todd Lawrence is a 67 y.o. male, presents to clinic with CC of left upper back pain under scapula, intermittent, radiating down left underarm, pain rated 6/10 when occurring, worse at night when attempting to lay down, pain described as "nerve pain" w/o numbness, tingling, weakness or color change.  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  He has not change from his baseline ROM, sensation or strength of left shoulder, arm or hand.    Pain is located under left shoulder blade Hurting for 3-4 d, worse at night especially when lying on his right or left side, causing trouble sleeping, at night left arm pain 6/10.  Pain is intermittent but when laying on right side pain becomes continuous  Denies any chest pain, respiratory or exertional, no shortness of breath, no numbness tingling, no fever chills sweats. He has history of low back pain arthritis of several joints but he denies any injury or arthritis known in his neck or upper back  He uses NSAIDs intermittently for his joint pain and was put on a PPI to help protect his stomach when taking NSAIDs daily in the past for low back pain  He has a history of hyperlipidemia, diabetes and is on a baby aspirin, Lipitor and metformin.  He has had osteomyelitis secondary to traumatic injury to his left hand and postop infections however no other history of recurrent bone infections.  Patient Active Problem List   Diagnosis Date Noted  . Hyperlipidemia 10/28/2016  . 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 Amputation of finger of left hand 08/21/2015    . 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 Prediabetes      Prior to Admission medications   Medication Sig Start Date End Date Taking? Authorizing Provider  aspirin 81 MG tablet Take 81 mg by mouth daily.   Yes [provider]  atorvastatin (LIPITOR) 20 MG tablet TAKE 1 TABLET EVERY DAY 06/17/17  Yes [name], MD  metFORMIN (GLUCOPHAGE) 500 MG tablet TAKE 1 TABLET BY MOUTH TWICE A DAY WITH A MEAL 10/03/17  Yes Pickard, Cammie Mcgee, MD  Misc Natural Products (GLUCOSAMINE CHOND COMPLEX/MSM PO) Take 1 tablet by mouth 2 (two) times daily.    Yes [provider]  Multiple Vitamin (MULTIVITAMIN WITH MINERALS) TABS tablet Take 1 tablet by mouth daily.   Yes [provider]     No Known Allergies   Family History  Problem Relation Age of Onset  . Heart disease Father   . Asthma Sister   . Arthritis Sister      Social History   Socioeconomic History  . Marital status: Married    Spouse name: Not on file  . Number of children: Not on file  . Years of education: Not on file  . Highest education level: Not on file  Occupational History  . Not on file  Social Needs  . Financial resource strain: Not on file  . Food insecurity:    Worry: Not on  file    Inability: Not on file  . Transportation needs:    Medical: Not on file    Non-medical: Not on file  Tobacco Use  . Smoking status: Never Smoker  . Smokeless tobacco: Never Used  Substance and Sexual Activity  . Alcohol use: No  . Drug use: No  . 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 Physical activity:    Days per week: Not on file    Minutes per session: Not on file  . Stress: Not on file  Relationships  . Social connections:    Talks on phone: Not on file    Gets together: Not on file    Attends religious service: Not on file    Active member of club or organization: Not on file    Attends meetings of clubs or  organizations: Not on file    Relationship status: Not on file  . Intimate partner violence:    Fear of current or ex partner: Not on file    Emotionally abused: Not on file    Physically abused: Not on file    Forced sexual activity: Not on file  Other Topics Concern  . Not on file  Social History Narrative  . Not on file     Review of Systems  Constitutional: Negative.  Negative for activity change, appetite change, chills, diaphoresis, fatigue, fever and unexpected weight change.  HENT: Negative.  Negative for congestion.   Eyes: Negative.   Respiratory: Negative.  Negative for apnea, cough, choking, chest tightness, shortness of breath, wheezing and stridor.   Cardiovascular: Negative.  Negative for chest pain, palpitations and leg swelling.  Gastrointestinal: Negative.  Negative for abdominal distention, abdominal pain, diarrhea, nausea and vomiting.  Endocrine: Negative.   Genitourinary: Negative.   Musculoskeletal: Positive for arthralgias, back pain and myalgias. Negative for joint swelling, neck pain and neck stiffness.  Skin: Negative.  Negative for color change, pallor and rash.  Neurological: Negative.  Negative for dizziness, tremors, syncope, weakness, light-headedness, numbness and headaches.  Hematological: Negative.   Psychiatric/Behavioral: Negative.   All other systems reviewed and are negative.      Objective:    Vitals:   11/03/17 0939  BP: 132/76  Pulse: 82  Resp: 14  Temp: 98.3 F (36.8 C)  TempSrc: Oral  SpO2: 98%  Weight: 168 lb (76.2 kg)  Height: 5' 9.5" (1.765 m)      Physical Exam  Constitutional: He appears well-developed and well-nourished. No distress.  HENT:  Head: Normocephalic and atraumatic.  Nose: Nose normal.  Mouth/Throat: Oropharynx is clear and moist.  Eyes: Conjunctivae are normal. No scleral icterus.  Neck: Normal range of motion. Neck supple. No JVD present. No tracheal deviation present. No thyromegaly present.   Cardiovascular: Normal rate, regular rhythm, normal heart sounds, intact distal pulses and normal pulses. PMI is not displaced. Exam reveals no gallop and no friction rub.  No murmur heard. Pulses:      Radial pulses are 2+ on the right side, and 2+ on the left side.  Pulmonary/Chest: Effort normal and breath sounds normal. No accessory muscle usage or stridor. No respiratory distress. He has no decreased breath sounds. He has no wheezes. He has no rhonchi. He has no rales. He exhibits no mass, no tenderness, no crepitus, no edema, no swelling and no retraction. Breasts are symmetrical.  Abdominal: Soft. Bowel sounds are normal. He exhibits no distension.  There is no tenderness.  Musculoskeletal: He exhibits no edema or tenderness.       Left shoulder: He exhibits decreased range of motion. He exhibits no tenderness, no bony tenderness, no swelling, no effusion, no pain, no spasm and normal strength.       Back:  Left shoulder decreased internal rotation, normal flexion and extension, normal strength, no tenderness to palpation, no crepitus thoracic back left paraspinal muscles asymmetrical and slightly enlarged compared to right, no edema, erythema, induration, tenderness, muscle spasms (see illustration for area) No palpable tenderness when lifting scapula and palpating rhomboid muscles, no trapezius muscle tenderness to palpation, no cervical paraspinal muscle tenderness to palpation, no midline spinal tenderness from cervical to lumbar spine, no step-offs  Neck normal range of motion, no splinting   Left thumb amputation, left second and third digits atrophied with decreased range of motion, left fourth and fifth fingers normal in appearance with normal range of motion No pallor, rubor, erythema, induration , warmth to left upper extremity, palpable strong 2+ pulses throughout bilateral upper extremities, normal capillary refill, normal sensation to light touch in bilateral upper extremities   Lymphadenopathy:    He has no cervical adenopathy.  Neurological: He is alert. No sensory deficit. He exhibits normal muscle tone. Coordination normal.  Skin: Skin is warm, dry and intact. Capillary refill takes less than 2 seconds. No rash noted. He is not diaphoretic. No cyanosis. No pallor. Nails show no clubbing.  Psychiatric: He has a normal mood and affect. His behavior is normal. Thought content normal.          Assessment & Plan:      ICD-10-CM   1. Upper back pain on left side M54.9 cyclobenzaprine (FLEXERIL) 10 MG tablet    DG Cervical Spine Complete    DG Thoracic Spine W/Swimmers    ketorolac (TORADOL) injection 60 mg  2. Neck pain on left side M54.2 cyclobenzaprine (FLEXERIL) 10 MG tablet    DG Cervical Spine Complete    DG Thoracic Spine W/Swimmers    ketorolac (TORADOL) injection 60 mg  3. Left arm pain M79.602 cyclobenzaprine (FLEXERIL) 10 MG tablet     MDM: 67 y/o male pt complains of 4 days of upper left back pain located under his scapula it is not reproducible with palpation or movement, his range of motion strength and sensation is at his baseline, he has good pulses and capillary refill. He describes intermittent radiation of pain that is much worse when trying to lay down at night get some sleep, location of pain does follow C7-T2 area of sensation, no midline spinal tenderness or step-off.  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  He jokes that he just needs his wife to massage him for at least 1 hour a day and he will feel much better.  He has not taken any medications or attempted any treatments No sx of chest pain, lungs CTA A&P. DDx - strain vs DDD of C&T spine, pain radiates down arm, but no current numbness, tingling, weakness.  X-rays to eval Toradol shot in clinic No steroids yet, not persistently radicular Rest, heat therapy, gentle  stretching, NSAIDS, and some time to see if it improves.  Will call pt with Xray results  2 week recheck if not improved, may need PT then   Delsa Grana, PA-C 11/03/17 9:43 AM

## 2017-11-03 NOTE — Patient Instructions (Signed)
Start muscle relaxers at night Tomorrow start NSAIDS - naproxen or ibuprofen as directed on box/bottle Continue your stomach medicine  See exercises and heat therapy below  I will call you with xray results  If it is a strain is will get better in usually 2-8 weeks If not improving we can order physical therapy to help  If still not improving other tests like MRI's may be indicated, but first a little time and some supportive measures may help this resolve.   Thoracic Strain A thoracic strain, which is sometimes called a mid-back strain, is an injury to the muscles or tendons that attach to the upper part of your back behind your chest. This type of injury occurs when a muscle is overstretched or overloaded. Thoracic strains can range from mild to severe. Mild strains may involve stretching a muscle or tendon without tearing it. These injuries may heal in 1-2 weeks. More severe strains involve tearing of muscle fibers or tendons. These will cause more pain and may take 6-8 weeks to heal. What are the causes? This condition may be caused by:  An injury in which a sudden force is placed on the muscle.  Exercising without properly warming up.  Overuse of the muscle.  Improper form during certain movements.  Other injuries that surround or cause stress on the mid-back, causing a strain on the muscles.  In some cases, the cause may not be known. What increases the risk? This injury is more common in:  Athletes.  People with obesity.  What are the signs or symptoms? The main symptom of this condition is pain, especially with movement. Other symptoms include:  Bruising.  Swelling.  Spasm.  How is this diagnosed? This condition may be diagnosed with a physical exam. X-rays may be taken to check for a fracture. How is this treated? This condition may be treated with:  Resting and icing the injured area.  Physical therapy. This will involve doing stretching and  strengthening exercises.  Medicines for pain and inflammation.  Follow these instructions at home:  Rest as needed. Follow instructions from your health care provider about any restrictions on activity.  If directed, apply ice to the injured area: ? Put ice in a plastic bag. ? Place a towel between your skin and the bag. ? Leave the ice on for 20 minutes, 2-3 times per day.  Take over-the-counter and prescription medicines only as told by your health care provider.  Begin doing exercises as told by your health care provider or physical therapist.  Always warm up properly before physical activity or sports.  Bend your knees before you lift heavy objects.  Keep all follow-up visits as told by your health care provider. This is important. Contact a health care provider if:  Your pain is not helped by medicine.  Your pain, bruising, or swelling is getting worse.  You have a fever. Get help right away if:  You have shortness of breath.  You have chest pain.  You develop numbness or weakness in your legs.  You have involuntary loss of urine (urinary incontinence). This information is not intended to replace advice given to you by your health care provider. Make sure you discuss any questions you have with your health care provider. Document Released: 09/18/2003 Document Revised: 02/28/2016 Document Reviewed: 08/22/2014 Elsevier Interactive Patient Education  2018 [place] therapy can help ease sore, stiff, injured, and tight muscles and joints. Heat relaxes your muscles, which may help  ease your pain. What are the risks? If you have any of the following conditions, do not use heat therapy unless your health care provider has approved:  Poor circulation.  Healing wounds or scarred skin in the area being treated.  Diabetes, heart disease, or high blood pressure.  Not being able to feel (numbness) the area being treated.  Unusual swelling of the  area being treated.  Active infections.  Blood clots.  Cancer.  Inability to communicate pain. This may include young children and people who have problems with their brain function (dementia).  Pregnancy.  Heat therapy should only be used on old, pre-existing, or long-lasting (chronic) injuries. Do not use heat therapy on new injuries unless directed by your health care provider. How to use heat therapy There are several different kinds of heat therapy, including:  Moist heat pack.  Warm water bath.  Hot water bottle.  Electric heating pad.  Heated gel pack.  Heated wrap.  Electric heating pad.  Use the heat therapy method suggested by your health care provider. Follow your health care provider's instructions on when and how to use heat therapy. General heat therapy recommendations  Do not sleep while using heat therapy. Only use heat therapy while you are awake.  Your skin may turn pink while using heat therapy. Do not use heat therapy if your skin turns red.  Do not use heat therapy if you have new pain.  High heat or long exposure to heat can cause burns. Be careful when using heat therapy to avoid burning your skin.  Do not use heat therapy on areas of your skin that are already irritated, such as with a rash or sunburn. Contact a health care provider if:  You have blisters, redness, swelling, or numbness.  You have new pain.  Your pain is worse. This information is not intended to replace advice given to you by your health care provider. Make sure you discuss any questions you have with your health care provider. Document Released: 09/20/2011 Document Revised: 12/04/2015 Document Reviewed: 08/21/2013 Elsevier Interactive Patient Education  [employer].

## 2017-11-04 NOTE — Progress Notes (Signed)
Xray of the cervical spine shows degenerative disc disease and prior surgery.  It also shows multiple areas of narrowing and this may be the cause of some of your symptoms that radiate down your arms.  No fractures or misalignment.  If your symptoms do not resolve we may need to do further imaging or get you to a spinal surgeon to reevaluate and treat this. Thoracic spine x-ray shows some spurring which is an x-ray finding related to arthritis, no other pertinent findings or concerns

## 2017-12-23 ENCOUNTER — Other Ambulatory Visit: Payer: Self-pay | Admitting: Family Medicine

## 2018-01-11 DIAGNOSIS — M1811 Unilateral primary osteoarthritis of first carpometacarpal joint, right hand: Secondary | ICD-10-CM | POA: Diagnosis not present

## 2018-03-01 ENCOUNTER — Other Ambulatory Visit: Payer: Medicare HMO

## 2018-03-07 ENCOUNTER — Encounter: Payer: Medicare HMO | Admitting: Family Medicine

## 2018-03-15 ENCOUNTER — Other Ambulatory Visit: Payer: Self-pay | Admitting: Family Medicine

## 2018-03-15 DIAGNOSIS — Z79899 Other long term (current) drug therapy: Secondary | ICD-10-CM

## 2018-03-15 DIAGNOSIS — E119 Type 2 diabetes mellitus without complications: Secondary | ICD-10-CM

## 2018-03-15 DIAGNOSIS — Z0000 Encounter for general adult medical examination without abnormal findings: Secondary | ICD-10-CM

## 2018-03-15 DIAGNOSIS — E785 Hyperlipidemia, unspecified: Secondary | ICD-10-CM

## 2018-03-15 DIAGNOSIS — E7800 Pure hypercholesterolemia, unspecified: Secondary | ICD-10-CM

## 2018-03-23 DIAGNOSIS — K5904 Chronic idiopathic constipation: Secondary | ICD-10-CM | POA: Diagnosis not present

## 2018-03-23 DIAGNOSIS — Z1211 Encounter for screening for malignant neoplasm of colon: Secondary | ICD-10-CM | POA: Diagnosis not present

## 2018-03-27 DIAGNOSIS — H11003 Unspecified pterygium of eye, bilateral: Secondary | ICD-10-CM | POA: Diagnosis not present

## 2018-03-27 DIAGNOSIS — H25813 Combined forms of age-related cataract, bilateral: Secondary | ICD-10-CM | POA: Diagnosis not present

## 2018-03-27 DIAGNOSIS — H04123 Dry eye syndrome of bilateral lacrimal glands: Secondary | ICD-10-CM | POA: Diagnosis not present

## 2018-03-27 DIAGNOSIS — D3131 Benign neoplasm of right choroid: Secondary | ICD-10-CM | POA: Diagnosis not present

## 2018-03-27 DIAGNOSIS — D3132 Benign neoplasm of left choroid: Secondary | ICD-10-CM | POA: Diagnosis not present

## 2018-03-27 DIAGNOSIS — H02206 Unspecified lagophthalmos left eye, unspecified eyelid: Secondary | ICD-10-CM | POA: Diagnosis not present

## 2018-03-27 DIAGNOSIS — H35413 Lattice degeneration of retina, bilateral: Secondary | ICD-10-CM | POA: Diagnosis not present

## 2018-03-27 DIAGNOSIS — H02203 Unspecified lagophthalmos right eye, unspecified eyelid: Secondary | ICD-10-CM | POA: Diagnosis not present

## 2018-03-27 DIAGNOSIS — H10413 Chronic giant papillary conjunctivitis, bilateral: Secondary | ICD-10-CM | POA: Diagnosis not present

## 2018-03-27 DIAGNOSIS — E119 Type 2 diabetes mellitus without complications: Secondary | ICD-10-CM | POA: Diagnosis not present

## 2018-03-27 LAB — HM DIABETES EYE EXAM

## 2018-03-28 ENCOUNTER — Other Ambulatory Visit: Payer: Self-pay | Admitting: Family Medicine

## 2018-03-28 ENCOUNTER — Other Ambulatory Visit: Payer: Medicare HMO

## 2018-03-28 DIAGNOSIS — E785 Hyperlipidemia, unspecified: Secondary | ICD-10-CM | POA: Diagnosis not present

## 2018-03-28 DIAGNOSIS — Z0000 Encounter for general adult medical examination without abnormal findings: Secondary | ICD-10-CM

## 2018-03-28 DIAGNOSIS — E119 Type 2 diabetes mellitus without complications: Secondary | ICD-10-CM | POA: Diagnosis not present

## 2018-03-28 DIAGNOSIS — E7800 Pure hypercholesterolemia, unspecified: Secondary | ICD-10-CM

## 2018-03-28 DIAGNOSIS — Z79899 Other long term (current) drug therapy: Secondary | ICD-10-CM | POA: Diagnosis not present

## 2018-03-29 LAB — HEMOGLOBIN A1C
Hgb A1c MFr Bld: 6.9 % of total Hgb — ABNORMAL HIGH (ref <5.7)
Mean Plasma Glucose: 151 (calc)
eAG (mmol/L): 8.4 (calc)

## 2018-04-03 ENCOUNTER — Other Ambulatory Visit: Payer: Self-pay | Admitting: *Deleted

## 2018-04-04 ENCOUNTER — Ambulatory Visit (INDEPENDENT_AMBULATORY_CARE_PROVIDER_SITE_OTHER): Payer: Medicare HMO | Admitting: Family Medicine

## 2018-04-04 ENCOUNTER — Encounter: Payer: Self-pay | Admitting: Family Medicine

## 2018-04-04 VITALS — BP 142/76 | HR 78 | Temp 98.0°F | Resp 14 | Ht 69.5 in | Wt 168.0 lb

## 2018-04-04 DIAGNOSIS — Z1159 Encounter for screening for other viral diseases: Secondary | ICD-10-CM

## 2018-04-04 DIAGNOSIS — Z23 Encounter for immunization: Secondary | ICD-10-CM | POA: Diagnosis not present

## 2018-04-04 DIAGNOSIS — E7800 Pure hypercholesterolemia, unspecified: Secondary | ICD-10-CM | POA: Diagnosis not present

## 2018-04-04 DIAGNOSIS — Z0000 Encounter for general adult medical examination without abnormal findings: Secondary | ICD-10-CM

## 2018-04-04 DIAGNOSIS — E119 Type 2 diabetes mellitus without complications: Secondary | ICD-10-CM

## 2018-04-04 NOTE — Progress Notes (Signed)
Subjective:    Patient ID: Todd Lawrence, male    DOB: 08-Mar-1951, 67 y.o.   MRN: [id number]  HPI  Patient is a very pleasant 67 year old Caucasian male here today for complete physical exam.  His last colonoscopy was in 2009.  He is already scheduled his repeat colonoscopy for later this year under the care of Dr. Collene Mares.  At Dr. Lorie Apley office, the patient had a battery of blood test performed which included a PSA that was 0.5, CMP that was normal, a CBC that was normal, and a fasting blood sugar of 120.  I checked hemoglobin A1c that was 6.9.  His fasting lipid panel was excellent with a total cholesterol less than 200, and LDL cholesterol less than 100, and triglycerides less than 100.  He is due for hepatitis C screening.  He is also due for a flu shot.  His immunization records are included below Immunization History  Administered Date(s) Administered  . Influenza,inj,Quad PF,6+ Mos 04/04/2018  . Pneumococcal Conjugate-13 11/16/2016  . Pneumococcal Polysaccharide-23 01/31/2015  . Tdap 09/25/2013, 08/09/2015  . Zoster 11/06/2013   Patient recently had a eye exam performed there was negative for any diabetic retinopathy Past Medical History:  Diagnosis Date  . Acute meniscal tear of left knee   . DDD (degenerative disc disease), lumbar    has received ESI x 3  . Diabetes mellitus without complication (Todd Lawrence)   . Hyperlipidemia   . Left rotator cuff tear    Past Surgical History:  Procedure Laterality Date  . CERVICAL FUSION    . FOOT SURGERY    . KNEE ARTHROSCOPY    . 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     Current Outpatient Medications on File Prior to Visit  Medication Sig Dispense Refill  . aspirin 81 MG tablet Take 81 mg by mouth daily.    Marland Kitchen atorvastatin (LIPITOR) 20 MG tablet TAKE 1 TABLET EVERY DAY 90 tablet 3  . metFORMIN (GLUCOPHAGE) 500 MG tablet TAKE 1 TABLET BY MOUTH TWICE A DAY WITH A MEAL 180 tablet 1  . Misc Natural Products (GLUCOSAMINE CHOND  COMPLEX/MSM PO) Take 1 tablet by mouth 2 (two) times daily.     . Multiple Vitamin (MULTIVITAMIN WITH MINERALS) TABS tablet Take 1 tablet by mouth daily.     No current facility-administered medications on file prior to visit.    No Known Allergies Social History   Socioeconomic History  . Marital status: Married    Spouse name: Not on file  . Number of children: Not on file  . Years of education: Not on file  . Highest education level: Not on file  Occupational History  . Not on file  Social Needs  . Financial resource strain: Not on file  . Food insecurity:    Worry: Not on file    Inability: Not on file  . Transportation needs:    Medical: Not on file    Non-medical: Not on file  Tobacco Use  . Smoking status: Never Smoker  . Smokeless tobacco: Never Used  Substance and Sexual Activity  . Alcohol use: No  . Drug use: No  . 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 Physical activity:    Days per week: Not on file    Minutes per session: Not on file  . Stress: Not on file  Relationships  . Social connections:  Talks on phone: Not on file    Gets together: Not on file    Attends religious service: Not on file    Active member of club or organization: Not on file    Attends meetings of clubs or organizations: Not on file    Relationship status: Not on file  . Intimate partner violence:    Fear of current or ex partner: Not on file    Emotionally abused: Not on file    Physically abused: Not on file    Forced sexual activity: Not on file  Other Topics Concern  . Not on file  Social History Narrative  . Not on file   Family History  Problem Relation Age of Onset  . Heart disease Father   . Asthma Sister   . Arthritis Sister      Review of Systems  All other systems reviewed and are negative.      Objective:   Physical Exam  Constitutional: He is oriented to person, place, and time. He  appears well-developed and well-nourished. No distress.  HENT:  Head: Normocephalic and atraumatic.  Right Ear: External ear normal.  Left Ear: External ear normal.  Nose: Nose normal.  Mouth/Throat: Oropharynx is clear and moist. No oropharyngeal exudate.  Eyes: Pupils are equal, round, and reactive to light. Conjunctivae and EOM are normal. Right eye exhibits no discharge. Left eye exhibits no discharge. No scleral icterus.  Neck: Normal range of motion. Neck supple. No JVD present. No thyromegaly present.  Cardiovascular: Normal rate, regular rhythm, normal heart sounds and intact distal pulses. Exam reveals no gallop and no friction rub.  No murmur heard. Pulmonary/Chest: Effort normal and breath sounds normal. No respiratory distress. He has no wheezes. He has no rales. He exhibits no tenderness.  Abdominal: Soft. Bowel sounds are normal. He exhibits no distension and no mass. There is no tenderness. There is no rebound and no guarding.  Genitourinary: Penis normal.  Musculoskeletal: Normal range of motion. He exhibits no edema or tenderness.  Lymphadenopathy:    He has no cervical adenopathy.  Neurological: He is alert and oriented to person, place, and time. He has normal reflexes. No cranial nerve deficit. He exhibits normal muscle tone. Coordination normal.  Skin: Skin is warm. No rash noted. He is not diaphoretic. No erythema. No pallor.  Psychiatric: He has a normal mood and affect. His behavior is normal. Judgment and thought content normal.  Vitals reviewed.         Assessment & Plan:  Encounter for hepatitis C screening test for low risk patient - Plan: Hepatitis C Antibody  Need for prophylactic vaccination and inoculation against influenza - Plan: Flu Vaccine QUAD 36+ mos IM  Controlled type 2 diabetes mellitus without complication, without long-term current use of insulin (Todd Lawrence)  Pure hypercholesterolemia  Medicare annual wellness visit, subsequent  Lab work was  excellent.  Hemoglobin A1c was slightly elevated at 6.9.  We discussed a low carbohydrate diet to address.  Recheck fasting lab work in 6 months.  Patient received his flu shot.  The remainder of his immunizations are up-to-date.  Colonoscopy will be performed later this year.  PSA is normal.  I will screen the patient for hepatitis C.  He denies any falls, depression, or memory problems.  Monitor his blood pressure closely however typically at home is 120-130/70.

## 2018-04-05 LAB — HEPATITIS C ANTIBODY
Hepatitis C Ab: NONREACTIVE
SIGNAL TO CUT-OFF: 0.01 (ref <1.00)

## 2018-05-08 ENCOUNTER — Encounter: Payer: Self-pay | Admitting: Family Medicine

## 2018-05-08 DIAGNOSIS — Z1211 Encounter for screening for malignant neoplasm of colon: Secondary | ICD-10-CM | POA: Diagnosis not present

## 2018-05-08 DIAGNOSIS — K635 Polyp of colon: Secondary | ICD-10-CM | POA: Diagnosis not present

## 2018-05-08 DIAGNOSIS — D124 Benign neoplasm of descending colon: Secondary | ICD-10-CM | POA: Diagnosis not present

## 2018-05-08 LAB — HM COLONOSCOPY

## 2018-06-19 ENCOUNTER — Ambulatory Visit: Payer: Medicare HMO | Admitting: Family Medicine

## 2018-08-01 DIAGNOSIS — M170 Bilateral primary osteoarthritis of knee: Secondary | ICD-10-CM | POA: Diagnosis not present

## 2018-08-07 DIAGNOSIS — M170 Bilateral primary osteoarthritis of knee: Secondary | ICD-10-CM | POA: Diagnosis not present

## 2018-08-14 DIAGNOSIS — M170 Bilateral primary osteoarthritis of knee: Secondary | ICD-10-CM | POA: Diagnosis not present

## 2018-08-21 DIAGNOSIS — M170 Bilateral primary osteoarthritis of knee: Secondary | ICD-10-CM | POA: Diagnosis not present

## 2018-08-29 ENCOUNTER — Encounter: Payer: Self-pay | Admitting: Family Medicine

## 2018-09-28 ENCOUNTER — Other Ambulatory Visit: Payer: Self-pay | Admitting: Family Medicine

## 2018-09-28 MED ORDER — OMEPRAZOLE 40 MG PO CPDR: 40.0000 mg | DELAYED_RELEASE_CAPSULE | Freq: Every day | ORAL | 3 refills | Status: DC

## 2018-10-02 DIAGNOSIS — M170 Bilateral primary osteoarthritis of knee: Secondary | ICD-10-CM | POA: Diagnosis not present

## 2018-10-19 ENCOUNTER — Other Ambulatory Visit: Payer: Self-pay | Admitting: *Deleted

## 2018-10-19 MED ORDER — METFORMIN HCL 500 MG PO TABS: ORAL_TABLET | ORAL | 1 refills | Status: DC

## 2018-12-11 ENCOUNTER — Other Ambulatory Visit: Payer: Self-pay | Admitting: Family Medicine

## 2019-01-04 DIAGNOSIS — M170 Bilateral primary osteoarthritis of knee: Secondary | ICD-10-CM | POA: Diagnosis not present

## 2019-02-09 ENCOUNTER — Encounter (HOSPITAL_COMMUNITY): Payer: Medicare HMO

## 2019-02-12 ENCOUNTER — Encounter (HOSPITAL_COMMUNITY): Payer: Self-pay | Admitting: Physician Assistant

## 2019-02-12 DIAGNOSIS — M1711 Unilateral primary osteoarthritis, right knee: Secondary | ICD-10-CM | POA: Diagnosis present

## 2019-02-12 DIAGNOSIS — M5136 Other intervertebral disc degeneration, lumbar region: Secondary | ICD-10-CM | POA: Diagnosis present

## 2019-02-12 DIAGNOSIS — E119 Type 2 diabetes mellitus without complications: Secondary | ICD-10-CM

## 2019-02-12 NOTE — H&P (Addendum)
TOTAL KNEE ADMISSION H&P  Patient is being admitted for right total knee arthroplasty.  Subjective:  Chief Complaint:right knee pain.  HPI: Todd Lawrence, 68 y.o. male, has a history of pain and functional disability in the right knee due to arthritis and has failed non-surgical conservative treatments for greater than 12 weeks to includeNSAID's and/or analgesics, corticosteriod injections, viscosupplementation injections, flexibility and strengthening excercises, supervised PT with diminished ADL's post treatment, use of assistive devices and activity modification.  Onset of symptoms was gradual, starting 10 years ago with gradually worsening course since that time. The patient noted prior procedures on the knee to include  arthroscopy and menisectomy on the right knee(s).  Patient currently rates pain in the right knee(s) at 10 out of 10 with activity. Patient has night pain, worsening of pain with activity and weight bearing, pain that interferes with activities of daily living, crepitus and joint swelling.  Patient has evidence of subchondral sclerosis, periarticular osteophytes and joint space narrowing by imaging studies.  There is no active infection.  Patient Active Problem List   Diagnosis Date Noted  . Primary localized osteoarthritis of right knee   . Diabetes mellitus without complication ([place])   . DDD (degenerative disc disease), lumbar   . Hyperlipidemia 10/28/2016  . Chronic osteomyelitis of left hand ([place]) 09/28/2015  . 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 Prediabetes    Past Medical History:  Diagnosis Date  . Acute meniscal tear of left knee   . DDD (degenerative disc disease), lumbar    has received ESI x 3  . Diabetes mellitus without complication ([place])   . Hyperlipidemia   . Left rotator cuff tear   . Primary localized osteoarthritis of left knee     Past Surgical History:  Procedure Laterality Date  . CERVICAL  FUSION    . FOOT SURGERY    . KNEE ARTHROSCOPY    . 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    No current facility-administered medications for this encounter.    Current Outpatient Medications  Medication Sig Dispense Refill Last Dose  . aspirin 81 MG tablet Take 81 mg by mouth daily.   02/12/2019 at Unknown time  . atorvastatin (LIPITOR) 20 MG tablet TAKE 1 TABLET EVERY DAY 90 tablet 3 02/12/2019 at Unknown time  . metFORMIN (GLUCOPHAGE) 500 MG tablet TAKE 1 TABLET BY MOUTH TWICE A DAY WITH A MEAL 180 tablet 1 02/12/2019 at Unknown time  . Misc Natural Products (GLUCOSAMINE CHOND COMPLEX/MSM PO) Take 1 tablet by mouth 2 (two) times daily.    02/12/2019 at Unknown time  . Multiple Vitamin (MULTIVITAMIN WITH MINERALS) TABS tablet Take 1 tablet by mouth daily.   02/12/2019 at Unknown time  . omeprazole (PRILOSEC) 40 MG capsule Take 1 capsule (40 mg total) by mouth daily. 90 capsule 3 02/12/2019 at Unknown time   No Known Allergies  Social History   Tobacco Use  . Smoking status: Never Smoker  . Smokeless tobacco: Never Used  Substance Use Topics  . Alcohol use: No    Family History  Problem Relation Age of Onset  . Heart disease Father   . Asthma Sister   . Arthritis Sister      Review of Systems  Constitutional: Negative.   HENT: Negative.   Eyes: Negative.   Respiratory: Negative.   Cardiovascular: Negative.   Gastrointestinal: Negative.   Genitourinary: Negative.   Musculoskeletal: Positive  for back pain and joint pain.  Skin: Negative.   Neurological: Negative.   Endo/Heme/Allergies: Negative.   Psychiatric/Behavioral: Negative.     Objective:  Physical Exam  Constitutional: He is oriented to person, place, and time. He appears well-developed and well-nourished.  HENT:  Head: Normocephalic.  Mouth/Throat: Oropharynx is clear and moist.  Eyes: Conjunctivae are normal.  Neck: Neck supple.  Cardiovascular: Normal rate, regular rhythm and normal heart sounds.   Respiratory: Effort normal and breath sounds normal.  GI: Soft. Bowel sounds are normal.  Genitourinary:    Genitourinary Comments: Not pertinent to current symptomatology therefore not examined.   Musculoskeletal:     Comments: Examination of his right knee revedals pain medial and lateral. There is 1+ crepitation and 1+ synovitis. The range of motion is 0-120 degrees. The knee is stable with normal patellar tracking. Examination of the left knee reveals a full range of motion without pain, swelling, weakness or instability. Vascular exam: pulses 2+ and symmetric.  Neurologic, distal motor and sensory examination is within normal limits.   Neurological: He is alert and oriented to person, place, and time.  Skin: Skin is warm and dry.  Psychiatric: He has a normal mood and affect. His behavior is normal.    Vital signs in last 24 hours: Temp:  [98.1 F (36.7 C)] 98.1 F (36.7 C) (08/03 1500) Pulse Rate:  [64] 64 (08/03 1500) BP: (145)/(82) 145/82 (08/03 1500) SpO2:  [99 %] 99 % (08/03 1500) Weight:  [73.1 kg] 73.1 kg (08/03 1500)  Labs:   Estimated body mass index is 23.13 kg/m as calculated from the following:   Height as of this encounter: 5\' 10"  (1.778 m).   Weight as of this encounter: 73.1 kg.   Imaging Review Plain radiographs demonstrate severe degenerative joint disease of the right knee(s). The overall alignment issignificant varus. The bone quality appears to be good for age and reported activity level.      Assessment/Plan:  End stage arthritis, right knee  Principal Problem:   Primary localized osteoarthritis of right knee Active Problems:   Diabetes mellitus without complication ([facility])   DDD (degenerative disc disease), lumbar   The patient history, physical examination, clinical judgment of the provider and imaging studies are consistent with end stage degenerative joint disease of the right knee(s) and total knee arthroplasty is deemed medically  necessary. The treatment options including medical management, injection therapy arthroscopy and arthroplasty were discussed at length. The risks and benefits of total knee arthroplasty were presented and reviewed. The risks due to aseptic loosening, infection, stiffness, patella tracking problems, thromboembolic complications and other imponderables were discussed. The patient acknowledged the explanation, agreed to proceed with the plan and consent was signed. Patient is being admitted for inpatient treatment for surgery, pain control, PT, OT, prophylactic antibiotics, VTE prophylaxis, progressive ambulation and ADL's and discharge planning. The patient is planning to be discharged home with home health services    Anticipated LOS equal to or greater than 2 midnights due to - Age 7 and older with one or more of the following:  - Obesity  - Expected need for hospital services (PT, OT, Nursing) required for safe  discharge  - Anticipated need for postoperative skilled nursing care or inpatient rehab  - Active co-morbidities: Diabetes OR

## 2019-02-15 NOTE — Patient Instructions (Addendum)
YOU NEED TO HAVE A COVID 19 TEST ON Thursday, Aug. 13, 2020 @ 3:30PM, THIS TEST MUST BE DONE BEFORE SURGERY, COME  [place], [place] , [address], pre surgical testing line. ONCE YOUR COVID TEST IS COMPLETED, PLEASE BEGIN THE QUARANTINE INSTRUCTIONS AS OUTLINED IN YOUR HANDOUT.                Todd Lawrence    Your procedure is scheduled on: 02-26-2019   Report to [facility] Main  Entrance    Report to  Short stay  at  530  AM    [address] [place].    Call this number if you have problems the morning of surgery [phone]    Remember: [place], NO CHEWING GUM CANDY OR MINTS.   NO SOLID FOOD AFTER MIDNIGHT THE NIGHT PRIOR TO SURGERY.    NOTHING BY MOUTH EXCEPT CLEAR LIQUIDS UNTIL 415 AM.    PLEASE FINISH  G2 DRINK PER SURGEON ORDER 3 HOURS PRIOR TO SCHEDULED SURGERY TIME WHICH NEEDS TO BE COMPLETED AT  415 AM.   CLEAR LIQUID DIET   Foods Allowed                                                                     Foods Excluded  Water, Black Coffee and tea, regular and decaf                             liquids that you cannot  Plain Jell-O any favor except red or purple                                           see through such as: Fruit ices (not with fruit pulp)                                     milk, soups, orange juice  Iced Popsicles                                    All solid food Carbonated beverages, regular and diet                                    Cranberry, grape and apple juices Sports drinks like Gatorade Lightly seasoned clear broth or consume(fat free) Sugar, honey syrup  Sample Menu Breakfast                                Lunch  Supper Cranberry juice                    Beef broth                            Chicken broth Jell-O                                     Grape juice                            Apple juice Coffee or tea                        Jell-O                                      Popsicle                                                Coffee or tea                        Coffee or tea  _____________________________________________________________________     Take these medicines the morning of surgery with A SIP OF WATER: Omeprazole                                You may not have any metal on your body including hair pins and              piercings  Do not wear jewelry, make-up, lotions, powders or perfumes, deodorant               Men may shave face and neck.   Do not bring valuables to the hospital. [name].   Contacts, dentures or bridgework may not be worn into surgery.   Leave suitcase in the car. After surgery it may be brought to your room.      _____________________________________________________________________             [facility] - Preparing for Surgery Before surgery, you can play an important role.  Because skin is not sterile, your skin needs to be as free of germs as possible.  You can reduce the number of germs on your skin by washing with CHG (chlorahexidine gluconate) soap before surgery.  CHG is an antiseptic cleaner which kills germs and bonds with the skin to continue killing germs even after washing. Please DO NOT use if you have an allergy to CHG or antibacterial soaps.  If your skin becomes reddened/irritated stop using the CHG and inform your nurse when you arrive at Short Stay. Do not shave (including legs and underarms) for at least 48 hours prior to the first CHG shower.  You may shave your face/neck. Please follow these instructions carefully:  1.  Shower with CHG Soap the night before surgery and the  morning of Surgery.  2.  If you choose to wash your hair, wash your hair first as usual with your  normal  shampoo.  3.  After you shampoo, rinse your hair and body  thoroughly to remove the  shampoo.                             4.  Use CHG as you would any other liquid soap.  You can apply chg directly  to the skin and wash                       Gently with a scrungie or clean washcloth.  5.  Apply the CHG Soap to your body ONLY FROM THE NECK DOWN.   Do not use on face/ open                           Wound or open sores. Avoid contact with eyes, ears mouth and genitals (private parts).                       Wash face,  Genitals (private parts) with your normal soap.             6.  Wash thoroughly, paying special attention to the area where your surgery  will be performed.  7.  Thoroughly rinse your body with warm water from the neck down.  8.  DO NOT shower/wash with your normal soap after using and rinsing off  the CHG Soap.                9.  Pat yourself dry with a clean towel.            10.  Wear clean pajamas.            11.  Place clean sheets on your bed the night of your first shower and do not  sleep with pets. Day of Surgery : Do not apply any lotions/deodorants the morning of surgery.  Please wear clean clothes to the hospital/surgery center.  FAILURE TO FOLLOW THESE INSTRUCTIONS MAY RESULT IN THE CANCELLATION OF YOUR SURGERY PATIENT SIGNATURE_________________________________  NURSE SIGNATURE__________________________________  ________________________________________________________________________   Todd Lawrence  An incentive spirometer is a tool that can help keep your lungs clear and active. This tool measures how well you are filling your lungs with each breath. Taking long deep breaths may help reverse or decrease the chance of developing breathing (pulmonary) problems (especially infection) following:  A long period of time when you are unable to move or be active. BEFORE THE PROCEDURE   If the spirometer includes an indicator to show your best effort, your nurse or respiratory therapist will set it to a desired  goal.  If possible, sit up straight or lean slightly forward. Try not to slouch.  Hold the incentive spirometer in an upright position. INSTRUCTIONS FOR USE  1. Sit on the edge of your bed if possible, or sit up as far as you can in bed or on a chair. 2. Hold the incentive spirometer in an upright position. 3. Breathe out normally. 4. Place the mouthpiece in your mouth and seal your lips tightly around it. 5. Breathe in slowly and as deeply as possible, raising the piston or the ball toward the top of the column. 6. Hold your breath for 3-5 seconds or for as long as possible. Allow the piston  or ball to fall to the bottom of the column. 7. Remove the mouthpiece from your mouth and breathe out normally. 8. Rest for a few seconds and repeat Steps 1 through 7 at least 10 times every 1-2 hours when you are awake. Take your time and take a few normal breaths between deep breaths. 9. The spirometer may include an indicator to show your best effort. Use the indicator as a goal to work toward during each repetition. 10. After each set of 10 deep breaths, practice coughing to be sure your lungs are clear. If you have an incision (the cut made at the time of surgery), support your incision when coughing by placing a pillow or rolled up towels firmly against it. Once you are able to get out of bed, walk around indoors and cough well. You may stop using the incentive spirometer when instructed by your caregiver.  RISKS AND COMPLICATIONS  Take your time so you do not get dizzy or light-headed.  If you are in pain, you may need to take or ask for pain medication before doing incentive spirometry. It is harder to take a deep breath if you are having pain. AFTER USE  Rest and breathe slowly and easily.  It can be helpful to keep track of a log of your progress. Your caregiver can provide you with a simple table to help with this. If you are using the spirometer at home, follow these instructions: [place] IF:   You are having difficultly using the spirometer.  You have trouble using the spirometer as often as instructed.  Your pain medication is not giving enough relief while using the spirometer.  You develop fever of 100.5 F (38.1 C) or higher. SEEK IMMEDIATE MEDICAL CARE IF:   You cough up bloody sputum that had not been present before.  You develop fever of 102 F (38.9 C) or greater.  You develop worsening pain at or near the incision site. MAKE SURE YOU:   Understand these instructions.  Will watch your condition.  Will get help right away if you are not doing well or get worse. Document Released: 11/08/2006 Document Revised: 09/20/2011 Document Reviewed: 01/09/2007 [facility] Patient Information 2014 [place], [place].   ________________________________________________________________________

## 2019-02-16 ENCOUNTER — Encounter (HOSPITAL_COMMUNITY)
Admission: RE | Admit: 2019-02-16 | Discharge: 2019-02-16 | Disposition: A | Discharge status: Home / Self Care | Payer: Medicare HMO | Source: Ambulatory Visit | Attending: Orthopedic Surgery | Admitting: Orthopedic Surgery

## 2019-02-16 ENCOUNTER — Other Ambulatory Visit: Payer: Self-pay

## 2019-02-16 ENCOUNTER — Encounter (HOSPITAL_COMMUNITY): Payer: Self-pay

## 2019-02-16 DIAGNOSIS — R9431 Abnormal electrocardiogram [ECG] [EKG]: Secondary | ICD-10-CM | POA: Diagnosis not present

## 2019-02-16 DIAGNOSIS — M1711 Unilateral primary osteoarthritis, right knee: Secondary | ICD-10-CM | POA: Diagnosis not present

## 2019-02-16 DIAGNOSIS — Z01818 Encounter for other preprocedural examination: Secondary | ICD-10-CM | POA: Insufficient documentation

## 2019-02-16 HISTORY — DX: Personal history of other infectious and parasitic diseases: Z86.19

## 2019-02-16 HISTORY — DX: Gastro-esophageal reflux disease without esophagitis: K21.9

## 2019-02-16 HISTORY — DX: Personal history of other medical treatment: Z92.89

## 2019-02-16 LAB — PROTIME-INR
INR: 0.9 (ref 0.8–1.2)
Prothrombin Time: 12 seconds (ref 11.4–15.2)

## 2019-02-16 LAB — CBC WITH DIFFERENTIAL/PLATELET
Abs Immature Granulocytes: 0.01 10*3/uL (ref 0.00–0.07)
Basophils Absolute: 0.1 10*3/uL (ref 0.0–0.1)
Basophils Relative: 1 %
Eosinophils Absolute: 0.3 10*3/uL (ref 0.0–0.5)
Eosinophils Relative: 4 %
HCT: 41.4 % (ref 39.0–52.0)
Hemoglobin: 13.3 g/dL (ref 13.0–17.0)
Immature Granulocytes: 0 %
Lymphocytes Relative: 23 %
Lymphs Abs: 1.5 10*3/uL (ref 0.7–4.0)
MCH: 30.5 pg (ref 26.0–34.0)
MCHC: 32.1 g/dL (ref 30.0–36.0)
MCV: 95 fL (ref 80.0–100.0)
Monocytes Absolute: 0.6 10*3/uL (ref 0.1–1.0)
Monocytes Relative: 10 %
Neutro Abs: 4 10*3/uL (ref 1.7–7.7)
Neutrophils Relative %: 62 %
Platelets: 253 10*3/uL (ref 150–400)
RBC: 4.36 MIL/uL (ref 4.22–5.81)
RDW: 12.7 % (ref 11.5–15.5)
WBC: 6.4 10*3/uL (ref 4.0–10.5)
nRBC: 0 % (ref 0.0–0.2)

## 2019-02-16 LAB — COMPREHENSIVE METABOLIC PANEL
ALT: 19 U/L (ref 0–44)
AST: 24 U/L (ref 15–41)
Albumin: 4.4 g/dL (ref 3.5–5.0)
Alkaline Phosphatase: 62 U/L (ref 38–126)
Anion gap: 10 (ref 5–15)
BUN: 15 mg/dL (ref 8–23)
CO2: 28 mmol/L (ref 22–32)
Calcium: 9.4 mg/dL (ref 8.9–10.3)
Chloride: 97 mmol/L — ABNORMAL LOW (ref 98–111)
Creatinine, Ser: 0.76 mg/dL (ref 0.61–1.24)
GFR calc Af Amer: >60 mL/min (ref >60)
GFR calc non Af Amer: >60 mL/min (ref >60)
Glucose, Bld: 123 mg/dL — ABNORMAL HIGH (ref 70–99)
Potassium: 4.5 mmol/L (ref 3.5–5.1)
Sodium: 135 mmol/L (ref 135–145)
Total Bilirubin: 0.8 mg/dL (ref 0.3–1.2)
Total Protein: 7.7 g/dL (ref 6.5–8.1)

## 2019-02-16 LAB — GLUCOSE, CAPILLARY: Glucose-Capillary: 108 mg/dL — ABNORMAL HIGH (ref 70–99)

## 2019-02-16 LAB — HEMOGLOBIN A1C
Hgb A1c MFr Bld: 7 % — ABNORMAL HIGH (ref 4.8–5.6)
Mean Plasma Glucose: 154.2 mg/dL

## 2019-02-16 LAB — SURGICAL PCR SCREEN
MRSA, PCR: NEGATIVE
Staphylococcus aureus: NEGATIVE

## 2019-02-16 LAB — APTT: aPTT: 30 seconds (ref 24–36)

## 2019-02-16 NOTE — Progress Notes (Signed)
SPOKE W/  Shanon Brow     SCREENING SYMPTOMS OF COVID 19:   COUGH--NO  RUNNY NOSE--- NO  SORE THROAT---NO  NASAL CONGESTION----NO  SNEEZING----NO  SHORTNESS OF BREATH---NO  DIFFICULTY BREATHING---NO  TEMP >100.0 -----NO  UNEXPLAINED BODY ACHES------NO  CHILLS -------- NO  HEADACHES ---------NO  LOSS OF SMELL/ TASTE --------NO    HAVE YOU OR ANY FAMILY MEMBER TRAVELLED PAST 14 DAYS OUT OF THE   COUNTY---Traveled to [place] and [employer] STATE----NO COUNTRY----NO  HAVE YOU OR ANY FAMILY MEMBER BEEN EXPOSED TO ANYONE WITH COVID 19? NO

## 2019-02-17 LAB — URINE CULTURE: Culture: NO GROWTH

## 2019-02-22 ENCOUNTER — Other Ambulatory Visit (HOSPITAL_COMMUNITY)
Admission: RE | Admit: 2019-02-22 | Discharge: 2019-02-22 | Disposition: A | Discharge status: Home / Self Care | Payer: Medicare HMO | Source: Ambulatory Visit | Attending: Orthopedic Surgery | Admitting: Orthopedic Surgery

## 2019-02-22 DIAGNOSIS — Z01812 Encounter for preprocedural laboratory examination: Secondary | ICD-10-CM | POA: Insufficient documentation

## 2019-02-22 DIAGNOSIS — Z20828 Contact with and (suspected) exposure to other viral communicable diseases: Secondary | ICD-10-CM | POA: Insufficient documentation

## 2019-02-22 LAB — SARS CORONAVIRUS 2 (TAT 6-24 HRS): SARS Coronavirus 2: NEGATIVE

## 2019-02-23 ENCOUNTER — Other Ambulatory Visit: Payer: Self-pay | Admitting: Orthopedic Surgery

## 2019-02-23 NOTE — Care Plan (Signed)
Spoke with patient and wife prior to surgery. He will discharge to home with family and HHPT provided by [facility]. He has all needed equipment. CPM ordered for delivery by [employer]. Patient and MD agreeable with plan. CHoice offered.   Ladell Heads, [place]

## 2019-02-25 MED ORDER — BUPIVACAINE LIPOSOME 1.3 % IJ SUSP
20.0000 mL | Freq: Once | INTRAMUSCULAR | Status: DC
  Filled 2019-02-25: qty 20

## 2019-02-25 NOTE — Anesthesia Preprocedure Evaluation (Addendum)
Anesthesia Evaluation  Patient identified by MRN, date of birth, ID band Patient awake    Reviewed: Allergy & Precautions, NPO status , Patient's Chart, lab work & pertinent test results  Airway Mallampati: I  TM Distance: >3 FB Neck ROM: Full    Dental  (+) Chipped, Dental Advisory Given,    Pulmonary neg pulmonary ROS,    Pulmonary exam normal breath sounds clear to auscultation       Cardiovascular Normal cardiovascular exam Rhythm:Regular Rate:Normal  HLD   Neuro/Psych negative neurological ROS  negative psych ROS   GI/Hepatic Neg liver ROS, GERD  Medicated,  Endo/Other  negative endocrine ROSdiabetes  Renal/GU negative Renal ROS  negative genitourinary   Musculoskeletal  (+) Arthritis ,   Abdominal   Peds  Hematology negative hematology ROS (+)   Anesthesia Other Findings   Reproductive/Obstetrics                            Anesthesia Physical Anesthesia Plan  ASA: II  Anesthesia Plan: Spinal and Regional   Post-op Pain Management:  Regional for Post-op pain   Induction:   PONV Risk Score and Plan: 1 and Treatment may vary due to age or medical condition and Propofol infusion  Airway Management Planned: Natural Airway  Additional Equipment:   Intra-op Plan:   Post-operative Plan:   Informed Consent: I have reviewed the patients History and Physical, chart, labs and discussed the procedure including the risks, benefits and alternatives for the proposed anesthesia with the patient or authorized representative who has indicated his/her understanding and acceptance.     Dental advisory given  Plan Discussed with: CRNA  Anesthesia Plan Comments:        Anesthesia Quick Evaluation

## 2019-02-26 ENCOUNTER — Encounter (HOSPITAL_COMMUNITY)
Admission: RE | Disposition: A | Discharge status: Home Health | Payer: Self-pay | Source: Home / Self Care | Attending: Orthopedic Surgery

## 2019-02-26 ENCOUNTER — Encounter (HOSPITAL_COMMUNITY): Payer: Self-pay

## 2019-02-26 ENCOUNTER — Other Ambulatory Visit: Payer: Self-pay

## 2019-02-26 ENCOUNTER — Inpatient Hospital Stay (HOSPITAL_COMMUNITY): Payer: Medicare HMO | Admitting: Physician Assistant

## 2019-02-26 ENCOUNTER — Inpatient Hospital Stay (HOSPITAL_COMMUNITY)
Admission: RE | Admit: 2019-02-26 | Discharge: 2019-02-27 | DRG: 470 | Disposition: A | Discharge status: Home Health | Payer: Medicare HMO | Attending: Orthopedic Surgery | Admitting: Orthopedic Surgery

## 2019-02-26 ENCOUNTER — Inpatient Hospital Stay (HOSPITAL_COMMUNITY): Payer: Medicare HMO | Admitting: Certified Registered"

## 2019-02-26 DIAGNOSIS — Z79899 Other long term (current) drug therapy: Secondary | ICD-10-CM

## 2019-02-26 DIAGNOSIS — M5136 Other intervertebral disc degeneration, lumbar region: Secondary | ICD-10-CM | POA: Diagnosis present

## 2019-02-26 DIAGNOSIS — Z7984 Long term (current) use of oral hypoglycemic drugs: Secondary | ICD-10-CM

## 2019-02-26 DIAGNOSIS — E119 Type 2 diabetes mellitus without complications: Secondary | ICD-10-CM | POA: Diagnosis not present

## 2019-02-26 DIAGNOSIS — M1711 Unilateral primary osteoarthritis, right knee: Secondary | ICD-10-CM | POA: Diagnosis not present

## 2019-02-26 DIAGNOSIS — Z96651 Presence of right artificial knee joint: Secondary | ICD-10-CM | POA: Diagnosis not present

## 2019-02-26 DIAGNOSIS — Z7982 Long term (current) use of aspirin: Secondary | ICD-10-CM

## 2019-02-26 DIAGNOSIS — E785 Hyperlipidemia, unspecified: Secondary | ICD-10-CM | POA: Diagnosis not present

## 2019-02-26 DIAGNOSIS — K219 Gastro-esophageal reflux disease without esophagitis: Secondary | ICD-10-CM | POA: Diagnosis present

## 2019-02-26 DIAGNOSIS — G8918 Other acute postprocedural pain: Secondary | ICD-10-CM | POA: Diagnosis not present

## 2019-02-26 HISTORY — PX: TOTAL KNEE ARTHROPLASTY: SHX125

## 2019-02-26 HISTORY — DX: Unilateral primary osteoarthritis, left knee: M17.12

## 2019-02-26 LAB — GLUCOSE, CAPILLARY
Glucose-Capillary: 118 mg/dL — ABNORMAL HIGH (ref 70–99)
Glucose-Capillary: 160 mg/dL — ABNORMAL HIGH (ref 70–99)
Glucose-Capillary: 177 mg/dL — ABNORMAL HIGH (ref 70–99)
Glucose-Capillary: 234 mg/dL — ABNORMAL HIGH (ref 70–99)
Glucose-Capillary: 190 mg/dL — ABNORMAL HIGH (ref 70–99)

## 2019-02-26 LAB — HEMOGLOBIN A1C
Hgb A1c MFr Bld: 7.1 % — ABNORMAL HIGH (ref 4.8–5.6)
Mean Plasma Glucose: 157.07 mg/dL

## 2019-02-26 SURGERY — ARTHROPLASTY, KNEE, TOTAL
Anesthesia: Regional | Laterality: Right

## 2019-02-26 MED ORDER — POLYETHYLENE GLYCOL 3350 17 G PO PACK
17.0000 g | PACK | Freq: Two times a day (BID) | ORAL | Status: DC
  Administered 2019-02-26 – 2019-02-27 (×2): 17 g via ORAL
  Filled 2019-02-26 (×2): qty 1

## 2019-02-26 MED ORDER — POVIDONE-IODINE 10 % EX SWAB
2.0000 "application " | Freq: Once | CUTANEOUS | Status: AC
  Administered 2019-02-26: 2 via TOPICAL

## 2019-02-26 MED ORDER — POVIDONE-IODINE 7.5 % EX SOLN: Freq: Once | CUTANEOUS | Status: DC

## 2019-02-26 MED ORDER — WATER FOR IRRIGATION, STERILE IR SOLN
Status: DC | PRN
  Administered 2019-02-26: 2000 mL

## 2019-02-26 MED ORDER — CEFUROXIME SODIUM 1.5 G IV SOLR
INTRAVENOUS | Status: AC
  Filled 2019-02-26: qty 1.5

## 2019-02-26 MED ORDER — CHLORHEXIDINE GLUCONATE 4 % EX LIQD: 60.0000 mL | Freq: Once | CUTANEOUS | Status: DC

## 2019-02-26 MED ORDER — ALUM & MAG HYDROXIDE-SIMETH 200-200-20 MG/5ML PO SUSP: 30.0000 mL | ORAL | Status: DC | PRN

## 2019-02-26 MED ORDER — LIDOCAINE 2% (20 MG/ML) 5 ML SYRINGE
INTRAMUSCULAR | Status: AC
  Filled 2019-02-26: qty 5

## 2019-02-26 MED ORDER — OXYCODONE HCL 5 MG PO TABS
5.0000 mg | ORAL_TABLET | ORAL | Status: DC | PRN
  Administered 2019-02-26 – 2019-02-27 (×4): 5 mg via ORAL
  Administered 2019-02-27: 10 mg via ORAL
  Filled 2019-02-26: qty 1
  Filled 2019-02-26: qty 2
  Filled 2019-02-26: qty 1
  Filled 2019-02-26: qty 2
  Filled 2019-02-26: qty 1

## 2019-02-26 MED ORDER — CEFAZOLIN SODIUM-DEXTROSE 2-4 GM/100ML-% IV SOLN
2.0000 g | Freq: Four times a day (QID) | INTRAVENOUS | Status: AC
  Administered 2019-02-26: 2 g via INTRAVENOUS
  Filled 2019-02-26: qty 100

## 2019-02-26 MED ORDER — FENTANYL CITRATE (PF) 100 MCG/2ML IJ SOLN
INTRAMUSCULAR | Status: AC
  Filled 2019-02-26: qty 2

## 2019-02-26 MED ORDER — PROPOFOL 10 MG/ML IV BOLUS
INTRAVENOUS | Status: AC
  Filled 2019-02-26: qty 60

## 2019-02-26 MED ORDER — SODIUM CHLORIDE 0.9 % IJ SOLN
INTRAMUSCULAR | Status: DC | PRN
  Administered 2019-02-26: 50 mL

## 2019-02-26 MED ORDER — INSULIN GLARGINE 100 UNIT/ML ~~LOC~~ SOLN
20.0000 [IU] | Freq: Every day | SUBCUTANEOUS | Status: DC
  Administered 2019-02-26: 20 [IU] via SUBCUTANEOUS
  Filled 2019-02-26 (×2): qty 0.2

## 2019-02-26 MED ORDER — MENTHOL 3 MG MT LOZG: 1.0000 | LOZENGE | OROMUCOSAL | Status: DC | PRN

## 2019-02-26 MED ORDER — CEFAZOLIN SODIUM-DEXTROSE 2-4 GM/100ML-% IV SOLN
2.0000 g | INTRAVENOUS | Status: AC
  Administered 2019-02-26: 2 g via INTRAVENOUS

## 2019-02-26 MED ORDER — MIDAZOLAM HCL 2 MG/2ML IJ SOLN
INTRAMUSCULAR | Status: DC | PRN
  Administered 2019-02-26 (×2): 1 mg via INTRAVENOUS

## 2019-02-26 MED ORDER — GABAPENTIN 300 MG PO CAPS
300.0000 mg | ORAL_CAPSULE | Freq: Every day | ORAL | Status: DC
  Administered 2019-02-26: 300 mg via ORAL
  Filled 2019-02-26: qty 1

## 2019-02-26 MED ORDER — EPHEDRINE SULFATE-NACL 50-0.9 MG/10ML-% IV SOSY
PREFILLED_SYRINGE | INTRAVENOUS | Status: DC | PRN
  Administered 2019-02-26 (×8): 5 mg via INTRAVENOUS

## 2019-02-26 MED ORDER — BUPIVACAINE IN DEXTROSE 0.75-8.25 % IT SOLN
INTRATHECAL | Status: DC | PRN
  Administered 2019-02-26: 1.6 mL via INTRATHECAL

## 2019-02-26 MED ORDER — TRANEXAMIC ACID-NACL 1000-0.7 MG/100ML-% IV SOLN
INTRAVENOUS | Status: AC
  Filled 2019-02-26: qty 100

## 2019-02-26 MED ORDER — INSULIN ASPART 100 UNIT/ML ~~LOC~~ SOLN
4.0000 [IU] | Freq: Three times a day (TID) | SUBCUTANEOUS | Status: DC
  Administered 2019-02-26 – 2019-02-27 (×3): 4 [IU] via SUBCUTANEOUS

## 2019-02-26 MED ORDER — INSULIN ASPART 100 UNIT/ML ~~LOC~~ SOLN: 0.0000 [IU] | Freq: Every day | SUBCUTANEOUS | Status: DC

## 2019-02-26 MED ORDER — BUPIVACAINE-EPINEPHRINE 0.25% -1:200000 IJ SOLN
INTRAMUSCULAR | Status: DC | PRN
  Administered 2019-02-26: 30 mL

## 2019-02-26 MED ORDER — PROPOFOL 500 MG/50ML IV EMUL
INTRAVENOUS | Status: DC | PRN
  Administered 2019-02-26: 75 ug/kg/min via INTRAVENOUS

## 2019-02-26 MED ORDER — ACETAMINOPHEN 500 MG PO TABS
ORAL_TABLET | ORAL | Status: AC
  Filled 2019-02-26: qty 2

## 2019-02-26 MED ORDER — CEFAZOLIN SODIUM-DEXTROSE 2-4 GM/100ML-% IV SOLN
INTRAVENOUS | Status: AC
  Filled 2019-02-26: qty 100

## 2019-02-26 MED ORDER — LACTATED RINGERS IV SOLN
INTRAVENOUS | Status: DC
  Administered 2019-02-26: 06:00:00 via INTRAVENOUS

## 2019-02-26 MED ORDER — PHENOL 1.4 % MT LIQD
1.0000 | OROMUCOSAL | Status: DC | PRN
  Filled 2019-02-26: qty 177

## 2019-02-26 MED ORDER — FENTANYL CITRATE (PF) 100 MCG/2ML IJ SOLN: 25.0000 ug | INTRAMUSCULAR | Status: DC | PRN

## 2019-02-26 MED ORDER — DOCUSATE SODIUM 100 MG PO CAPS
100.0000 mg | ORAL_CAPSULE | Freq: Two times a day (BID) | ORAL | Status: DC
  Administered 2019-02-26 – 2019-02-27 (×2): 100 mg via ORAL
  Filled 2019-02-26 (×2): qty 1

## 2019-02-26 MED ORDER — PROPOFOL 10 MG/ML IV BOLUS
INTRAVENOUS | Status: AC
  Filled 2019-02-26: qty 20

## 2019-02-26 MED ORDER — ASPIRIN EC 325 MG PO TBEC
325.0000 mg | DELAYED_RELEASE_TABLET | Freq: Every day | ORAL | Status: DC
  Administered 2019-02-27: 325 mg via ORAL
  Filled 2019-02-26: qty 1

## 2019-02-26 MED ORDER — MIDAZOLAM HCL 2 MG/2ML IJ SOLN
INTRAMUSCULAR | Status: AC
  Filled 2019-02-26: qty 2

## 2019-02-26 MED ORDER — BUPIVACAINE LIPOSOME 1.3 % IJ SUSP
INTRAMUSCULAR | Status: DC | PRN
  Administered 2019-02-26: 20 mL

## 2019-02-26 MED ORDER — SODIUM CHLORIDE (PF) 0.9 % IJ SOLN
INTRAMUSCULAR | Status: AC
  Filled 2019-02-26: qty 50

## 2019-02-26 MED ORDER — ROPIVACAINE HCL 5 MG/ML IJ SOLN
INTRAMUSCULAR | Status: DC | PRN
  Administered 2019-02-26: 20 mL via PERINEURAL

## 2019-02-26 MED ORDER — SODIUM CHLORIDE 0.9 % IV BOLUS
500.0000 mL | Freq: Once | INTRAVENOUS | Status: AC
  Administered 2019-02-26: 19:00:00 500 mL via INTRAVENOUS

## 2019-02-26 MED ORDER — ONDANSETRON HCL 4 MG/2ML IJ SOLN
INTRAMUSCULAR | Status: DC | PRN
  Administered 2019-02-26: 4 mg via INTRAVENOUS

## 2019-02-26 MED ORDER — ONDANSETRON HCL 4 MG/2ML IJ SOLN: 4.0000 mg | Freq: Four times a day (QID) | INTRAMUSCULAR | Status: DC | PRN

## 2019-02-26 MED ORDER — ONDANSETRON HCL 4 MG/2ML IJ SOLN
INTRAMUSCULAR | Status: AC
  Filled 2019-02-26: qty 2

## 2019-02-26 MED ORDER — METOCLOPRAMIDE HCL 5 MG PO TABS: 5.0000 mg | ORAL_TABLET | Freq: Three times a day (TID) | ORAL | Status: DC | PRN

## 2019-02-26 MED ORDER — DEXAMETHASONE SODIUM PHOSPHATE 10 MG/ML IJ SOLN
10.0000 mg | Freq: Three times a day (TID) | INTRAMUSCULAR | Status: AC
  Administered 2019-02-26 – 2019-02-27 (×3): 10 mg via INTRAVENOUS
  Filled 2019-02-26 (×3): qty 1

## 2019-02-26 MED ORDER — BUPIVACAINE-EPINEPHRINE (PF) 0.25% -1:200000 IJ SOLN
INTRAMUSCULAR | Status: AC
  Filled 2019-02-26: qty 30

## 2019-02-26 MED ORDER — METOCLOPRAMIDE HCL 5 MG/ML IJ SOLN: 5.0000 mg | Freq: Three times a day (TID) | INTRAMUSCULAR | Status: DC | PRN

## 2019-02-26 MED ORDER — DIPHENHYDRAMINE HCL 12.5 MG/5ML PO ELIX: 12.5000 mg | ORAL_SOLUTION | ORAL | Status: DC | PRN

## 2019-02-26 MED ORDER — ONDANSETRON HCL 4 MG PO TABS: 4.0000 mg | ORAL_TABLET | Freq: Four times a day (QID) | ORAL | Status: DC | PRN

## 2019-02-26 MED ORDER — HYDROMORPHONE HCL 1 MG/ML IJ SOLN: 0.5000 mg | INTRAMUSCULAR | Status: DC | PRN

## 2019-02-26 MED ORDER — DEXAMETHASONE SODIUM PHOSPHATE 10 MG/ML IJ SOLN
INTRAMUSCULAR | Status: AC
  Filled 2019-02-26: qty 1

## 2019-02-26 MED ORDER — ACETAMINOPHEN 500 MG PO TABS
1000.0000 mg | ORAL_TABLET | Freq: Four times a day (QID) | ORAL | Status: AC
  Administered 2019-02-26 – 2019-02-27 (×4): 1000 mg via ORAL
  Filled 2019-02-26 (×4): qty 2

## 2019-02-26 MED ORDER — FENTANYL CITRATE (PF) 100 MCG/2ML IJ SOLN
INTRAMUSCULAR | Status: DC | PRN
  Administered 2019-02-26 (×2): 25 ug via INTRAVENOUS

## 2019-02-26 MED ORDER — ACETAMINOPHEN 500 MG PO TABS
1000.0000 mg | ORAL_TABLET | Freq: Once | ORAL | Status: AC
  Administered 2019-02-26: 1000 mg via ORAL

## 2019-02-26 MED ORDER — 0.9 % SODIUM CHLORIDE (POUR BTL) OPTIME
TOPICAL | Status: DC | PRN
  Administered 2019-02-26: 1000 mL

## 2019-02-26 MED ORDER — POVIDONE-IODINE 10 % EX SWAB: 2.0000 "application " | Freq: Once | CUTANEOUS | Status: DC

## 2019-02-26 MED ORDER — INSULIN ASPART 100 UNIT/ML ~~LOC~~ SOLN
0.0000 [IU] | Freq: Three times a day (TID) | SUBCUTANEOUS | Status: DC
  Administered 2019-02-26: 17:00:00 5 [IU] via SUBCUTANEOUS
  Administered 2019-02-26 – 2019-02-27 (×2): 3 [IU] via SUBCUTANEOUS

## 2019-02-26 MED ORDER — DEXAMETHASONE SODIUM PHOSPHATE 10 MG/ML IJ SOLN
8.0000 mg | Freq: Once | INTRAMUSCULAR | Status: AC
  Administered 2019-02-26: 08:00:00 8 mg via INTRAVENOUS

## 2019-02-26 MED ORDER — TRANEXAMIC ACID-NACL 1000-0.7 MG/100ML-% IV SOLN
1000.0000 mg | INTRAVENOUS | Status: AC
  Administered 2019-02-26: 08:00:00 1000 mg via INTRAVENOUS

## 2019-02-26 MED ORDER — POTASSIUM CHLORIDE IN NACL 20-0.9 MEQ/L-% IV SOLN
INTRAVENOUS | Status: DC
  Administered 2019-02-26 (×2): via INTRAVENOUS
  Filled 2019-02-26 (×3): qty 1000

## 2019-02-26 MED ORDER — PROPOFOL 10 MG/ML IV BOLUS
INTRAVENOUS | Status: DC | PRN
  Administered 2019-02-26: 10 mg via INTRAVENOUS
  Administered 2019-02-26: 20 mg via INTRAVENOUS

## 2019-02-26 SURGICAL SUPPLY — 61 items
APL PRP STRL LF DISP 70% ISPRP (MISCELLANEOUS) ×2
ATTUNE MED DOME PAT 38 KNEE (Knees) ×1 IMPLANT
ATTUNE PS FEM RT SZ 7 CEM KNEE (Femur) ×1 IMPLANT
ATTUNE PSRP INSR SZ7 8 KNEE (Insert) ×1 IMPLANT
BAG SPEC THK2 15X12 ZIP CLS (MISCELLANEOUS) ×1
BAG ZIPLOCK 12X15 (MISCELLANEOUS) ×2 IMPLANT
BASE TIBIAL ROT PLAT SZ 8 KNEE (Knees) IMPLANT
BLADE HEX COATED 2.75 (ELECTRODE) ×2 IMPLANT
BLADE SAGITTAL 25.0X1.19X90 (BLADE) ×2 IMPLANT
BLADE SAW SGTL 13X75X1.27 (BLADE) ×2 IMPLANT
BLADE SURG SZ10 CARB STEEL (BLADE) ×2 IMPLANT
BNDG CMPR MED 10X6 ELC LF (GAUZE/BANDAGES/DRESSINGS) ×1
BNDG ELASTIC 6X10 VLCR STRL LF (GAUZE/BANDAGES/DRESSINGS) ×2 IMPLANT
BOWL SMART MIX CTS (DISPOSABLE) ×2 IMPLANT
BSPLAT TIB 8 CMNT ROT PLAT STR (Knees) ×1 IMPLANT
CEMENT HV SMART SET (Cement) ×4 IMPLANT
CHLORAPREP W/TINT 26 (MISCELLANEOUS) ×4 IMPLANT
COVER SURGICAL LIGHT HANDLE (MISCELLANEOUS) ×2 IMPLANT
COVER WAND RF STERILE (DRAPES) ×1 IMPLANT
CUFF TOURN SGL QUICK 34 (TOURNIQUET CUFF) ×2
CUFF TRNQT CYL 34X4.125X (TOURNIQUET CUFF) ×1 IMPLANT
DECANTER SPIKE VIAL GLASS SM (MISCELLANEOUS) ×4 IMPLANT
DRAPE ORTHO SPLIT 77X108 STRL (DRAPES) ×2
DRAPE SHEET LG 3/4 BI-LAMINATE (DRAPES) ×2 IMPLANT
DRAPE SURG ORHT 6 SPLT 77X108 (DRAPES) ×1 IMPLANT
DRAPE U-SHAPE 47X51 STRL (DRAPES) ×2 IMPLANT
DRSG AQUACEL AG ADV 3.5X10 (GAUZE/BANDAGES/DRESSINGS) ×2 IMPLANT
ELECT REM PT RETURN 15FT ADLT (MISCELLANEOUS) ×2 IMPLANT
GLOVE BIO SURGEON STRL SZ7 (GLOVE) ×2 IMPLANT
GLOVE BIOGEL PI IND STRL 7.0 (GLOVE) ×1 IMPLANT
GLOVE BIOGEL PI IND STRL 7.5 (GLOVE) ×1 IMPLANT
GLOVE BIOGEL PI INDICATOR 7.0 (GLOVE) ×1
GLOVE BIOGEL PI INDICATOR 7.5 (GLOVE) ×1
GLOVE SS BIOGEL STRL SZ 7.5 (GLOVE) ×1 IMPLANT
GLOVE SUPERSENSE BIOGEL SZ 7.5 (GLOVE) ×1
GOWN STRL REUS W/ TWL LRG LVL3 (GOWN DISPOSABLE) ×2 IMPLANT
GOWN STRL REUS W/TWL LRG LVL3 (GOWN DISPOSABLE) ×4
HANDPIECE INTERPULSE COAX TIP (DISPOSABLE) ×2
HOLDER FOLEY CATH W/STRAP (MISCELLANEOUS) ×1 IMPLANT
HOOD PEEL AWAY FLYTE STAYCOOL (MISCELLANEOUS) ×7 IMPLANT
KIT TURNOVER KIT A (KITS) ×2 IMPLANT
MANIFOLD NEPTUNE II (INSTRUMENTS) ×2 IMPLANT
MARKER SKIN DUAL TIP RULER LAB (MISCELLANEOUS) ×2 IMPLANT
NEEDLE HYPO 22GX1.5 SAFETY (NEEDLE) ×2 IMPLANT
PACK TOTAL KNEE CUSTOM (KITS) ×2 IMPLANT
PIN DRILL FIX HALF THREAD (BIT) ×1 IMPLANT
PIN STEINMAN FIXATION KNEE (PIN) ×1 IMPLANT
PROTECTOR NERVE ULNAR (MISCELLANEOUS) ×2 IMPLANT
SET HNDPC FAN SPRY TIP SCT (DISPOSABLE) ×1 IMPLANT
STAPLER VISISTAT 35W (STAPLE) ×1 IMPLANT
STRIP CLOSURE SKIN 1/2X4 (GAUZE/BANDAGES/DRESSINGS) ×2 IMPLANT
SUT MNCRL AB 3-0 PS2 18 (SUTURE) ×2 IMPLANT
SUT VIC AB 0 CT1 36 (SUTURE) ×4 IMPLANT
SUT VIC AB 1 CT1 36 (SUTURE) ×2 IMPLANT
SUT VIC AB 2-0 CT1 27 (SUTURE) ×4
SUT VIC AB 2-0 CT1 TAPERPNT 27 (SUTURE) ×2 IMPLANT
SYR CONTROL 10ML LL (SYRINGE) ×4 IMPLANT
TIBIAL BASE ROT PLAT SZ 8 KNEE (Knees) ×2 IMPLANT
WATER STERILE IRR 1000ML POUR (IV SOLUTION) ×4 IMPLANT
YANKAUER SUCT BULB TIP 10FT TU (MISCELLANEOUS) ×2 IMPLANT
YANKAUER SUCT BULB TIP NO VENT (SUCTIONS) ×2 IMPLANT

## 2019-02-26 NOTE — Transfer of Care (Signed)
Immediate Anesthesia Transfer of Care Note  Patient: Todd Lawrence  Procedure(s) Performed: TOTAL KNEE ARTHROPLASTY (Right )  Patient Location: PACU  Anesthesia Type:Spinal  Level of Consciousness: awake, alert  and oriented  Airway & Oxygen Therapy: Patient Spontanous Breathing and Patient connected to face mask oxygen  Post-op Assessment: Report given to RN and Post -op Vital signs reviewed and stable  Post vital signs: Reviewed and stable  Last Vitals:  Vitals Value Taken Time  BP    Temp    Pulse    Resp    SpO2      Last Pain:  Vitals:   02/26/19 0627  TempSrc: Oral  PainSc:          Complications: No apparent anesthesia complications

## 2019-02-26 NOTE — Anesthesia Procedure Notes (Signed)
Spinal  Patient location during procedure: OR Start time: 02/26/2019 7:14 AM End time: 02/26/2019 7:24 AM Staffing Anesthesiologist: Freddrick March, MD Performed: anesthesiologist  Preanesthetic Checklist Completed: patient identified, surgical consent, pre-op evaluation, timeout performed, IV checked, risks and benefits discussed and monitors and equipment checked Spinal Block Patient position: sitting Prep: site prepped and draped and DuraPrep Patient monitoring: cardiac monitor, continuous pulse ox and blood pressure Approach: midline Location: L3-4 Injection technique: single-shot Needle Needle type: Pencan  Needle gauge: 24 G Needle length: 9 cm Assessment Sensory level: T6 Additional Notes Functioning IV was confirmed and monitors were applied. Sterile prep and drape, including hand hygiene and sterile gloves were used. The patient was positioned and the spine was prepped. The skin was anesthetized with lidocaine.  Free flow of clear CSF was obtained prior to injecting local anesthetic into the CSF.  The spinal needle aspirated freely following injection.  The needle was carefully withdrawn.  The patient tolerated the procedure well.

## 2019-02-26 NOTE — Anesthesia Procedure Notes (Signed)
Anesthesia Regional Block: Adductor canal block   Pre-Anesthetic Checklist: ,, timeout performed, Correct Patient, Correct Site, Correct Laterality, Correct Procedure, Correct Position, site marked, Risks and benefits discussed,  Surgical consent,  Pre-op evaluation,  At surgeon's request and post-op pain management  Laterality: Right  Prep: Maximum Sterile Barrier Precautions used, chloraprep       Needles:  Injection technique: Single-shot  Needle Type: Echogenic Stimulator Needle     Needle Length: 9cm  Needle Gauge: 22     Additional Needles:   Procedures:,,,, ultrasound used (permanent image in chart),,,,  Narrative:  Start time: 02/26/2019 6:55 AM End time: 02/26/2019 7:05 AM Injection made incrementally with aspirations every 5 mL.  Performed by: Personally  Anesthesiologist: Freddrick March, MD  Additional Notes: Monitors applied. No increased pain on injection. No increased resistance to injection. Injection made in 5cc increments. Good needle visualization. Patient tolerated procedure well.

## 2019-02-26 NOTE — Care Plan (Signed)
Ortho Bundle Case Management Note  Patient Details  Name: Todd Lawrence MRN: [id number] Date of Birth: [birth year]  Spoke with patient and wife prior to surgery. He will discharge to home with family and HHPT provided by [facility]. He has all needed equipment. CPM ordered for delivery by [employer]. Patient and MD agreeable with plan. CHoice offered.                    DME Arranged:  CPM DME Agency:  [employer]  HH Arranged:  PT [place] Agency:  [facility]  Additional Comments: Please contact me with any questions of if this plan should need to change.  Ladell Heads,  [place] [facility]  [phone] 02/26/2019, 9:07 AM

## 2019-02-26 NOTE — Plan of Care (Signed)

## 2019-02-26 NOTE — Anesthesia Procedure Notes (Signed)
Procedure Name: MAC Date/Time: 02/26/2019 7:17 AM Performed by: Eben Burow, CRNA Pre-anesthesia Checklist: Patient identified, Emergency Drugs available, Suction available, Patient being monitored and Timeout performed Oxygen Delivery Method: Simple face mask Dental Injury: Teeth and Oropharynx as per pre-operative assessment

## 2019-02-26 NOTE — Anesthesia Postprocedure Evaluation (Signed)
Anesthesia Post Note  Patient: Todd Lawrence  Procedure(s) Performed: TOTAL KNEE ARTHROPLASTY (Right )     Patient location during evaluation: PACU Anesthesia Type: Regional and Spinal Level of consciousness: awake and alert Pain management: pain level controlled Vital Signs Assessment: post-procedure vital signs reviewed and stable Respiratory status: spontaneous breathing, nonlabored ventilation, respiratory function stable and patient connected to nasal cannula oxygen Cardiovascular status: blood pressure returned to baseline and stable Postop Assessment: no apparent nausea or vomiting Anesthetic complications: no    Last Vitals:  Vitals:   02/26/19 1022 02/26/19 1138  BP: 128/81 119/80  Pulse: 77 75  Resp:  16  Temp: 36.6 C   SpO2: 100% 100%    Last Pain:  Vitals:   02/26/19 1022  TempSrc: Oral  PainSc:                  [name] [name]

## 2019-02-26 NOTE — Evaluation (Signed)
Physical Therapy Evaluation Patient Details Name: Todd Lawrence MRN: [id number] DOB: Mar 03, 1951 Today's Date: 02/26/2019   History of Present Illness  68 yo male s/p R TKR on 02/26/19. PMH includes DM, lumbar DDD, HLD, preDM, L RTC tear, cervical fusion, L meniscus tear.  Clinical Impression  Pt presents with R knee pain, decreased R knee ROM, increased time and effort to perform mobility tasks, and decreased activity tolerance post-operatively. Pt to benefit from acute PT to address deficits. Pt ambulated 100 ft with RW with min guard assist, verbal cuing for form and safety provided throughout. Pt educated on ankle pumps (20/hour) to perform this afternoon/evening to increase circulation, to pt's tolerance and limited by pain. PT to progress mobility as tolerated, and will continue to follow acutely.        Follow Up Recommendations Follow surgeon's recommendation for DC plan and follow-up therapies;Supervision for mobility/OOB(HHPT)    Equipment Recommendations  None recommended by PT    Recommendations for Other Services       Precautions / Restrictions Precautions Precautions: Fall Restrictions Weight Bearing Restrictions: No Other Position/Activity Restrictions: WBAT      Mobility  Bed Mobility Overal bed mobility: Needs Assistance Bed Mobility: Supine to Sit     Supine to sit: HOB elevated;Min guard     General bed mobility comments: Min guard for safety, verbal cuing for sequencing. Increased time and effort with use of bedrails to come to sitting.  Transfers Overall transfer level: Needs assistance Equipment used: Rolling walker (2 wheeled) Transfers: Sit to/from Stand Sit to Stand: Min guard;From elevated surface         General transfer comment: Min guard for safety, verbal cuing for hand placement when rising.  Ambulation/Gait Ambulation/Gait assistance: Min guard Gait Distance (Feet): 100 Feet Assistive device: Rolling walker (2 wheeled) Gait  Pattern/deviations: Step-to pattern;Decreased step length - right;Decreased step length - left;Decreased weight shift to right;Trunk flexed Gait velocity: decr   General Gait Details: Min guard for safety, verbal cuing for sequencing, upright posture, placement in RW, and turning with RW.  Stairs            Wheelchair Mobility    Modified Rankin (Stroke Patients Only)       Balance Overall balance assessment: Mild deficits observed, not formally tested                                           Pertinent Vitals/Pain Pain Assessment: 0-10 Pain Score: 2  Pain Location: R knee Pain Descriptors / Indicators: Sore Pain Intervention(s): Limited activity within patient's tolerance;Monitored during session;Repositioned;Premedicated before session    Home Living Family/patient expects to be discharged to:: Private residence Living Arrangements: Spouse/significant other Available Help at Discharge: Family Type of Home: House Home Access: Stairs to enter Entrance Stairs-Rails: Left Entrance Stairs-Number of Steps: 4 Home Layout: One level Home Equipment: Environmental consultant - 2 wheels;Cane - single point;Shower seat;Bedside commode      Prior Function Level of Independence: Independent               Hand Dominance   Dominant Hand: Right    Extremity/Trunk Assessment   Upper Extremity Assessment Upper Extremity Assessment: Overall WFL for tasks assessed    Lower Extremity Assessment Lower Extremity Assessment: Overall WFL for tasks assessed;RLE deficits/detail RLE Deficits / Details: suspected post-surgical weakness; able to perform ankle pumps, quad set, heel slide to  90*, SLR without quad lag or lift assist RLE Sensation: WNL    Cervical / Trunk Assessment Cervical / Trunk Assessment: Normal  Communication   Communication: No difficulties  Cognition Arousal/Alertness: Awake/alert Behavior During Therapy: WFL for tasks assessed/performed Overall  Cognitive Status: Within Functional Limits for tasks assessed                                        General Comments      Exercises     Assessment/Plan    PT Assessment Patient needs continued PT services  PT Problem List Decreased strength;Decreased mobility;Decreased range of motion;Decreased coordination;Decreased activity tolerance;Decreased balance;Decreased knowledge of use of DME       PT Treatment Interventions DME instruction;Therapeutic activities;Gait training;Therapeutic exercise;Patient/family education;Stair training;Balance training;Functional mobility training    PT Goals (Current goals can be found in the Care Plan section)  Acute Rehab PT Goals PT Goal Formulation: With patient Time For Goal Achievement: 03/05/19 Potential to Achieve Goals: Good    Frequency 7X/week   Barriers to discharge        Co-evaluation               AM-PAC PT "6 Clicks" Mobility  Outcome Measure Help needed turning from your back to your side while in a flat bed without using bedrails?: A Little Help needed moving from lying on your back to sitting on the side of a flat bed without using bedrails?: A Little Help needed moving to and from a bed to a chair (including a wheelchair)?: A Little Help needed standing up from a chair using your arms (e.g., wheelchair or bedside chair)?: A Little Help needed to walk in hospital room?: A Little Help needed climbing 3-5 steps with a railing? : A Little 6 Click Score: 18    End of Session Equipment Utilized During Treatment: Gait belt Activity Tolerance: Patient tolerated treatment well Patient left: in chair;with chair alarm set;with family/visitor present;with SCD's reapplied;with call bell/phone within reach Nurse Communication: Mobility status PT Visit Diagnosis: Other abnormalities of gait and mobility (R26.89);Difficulty in walking, not elsewhere classified (R26.2)    Time: 9030-0923 PT Time Calculation  (min) (ACUTE ONLY): 24 min   Charges:   PT Evaluation $PT Eval Low Complexity: 1 Low PT Treatments $Gait Training: 8-22 mins       Julien Girt, PT Acute Rehabilitation Services Pager [phone]  Office [phone]  [place]ula Zappia D Elonda Husky 02/26/2019, 3:52 PM

## 2019-02-26 NOTE — Op Note (Signed)
MRN:     [id number] DOB/AGE:    70-03-1951 / 70 y.o.       OPERATIVE REPORT   DATE OF PROCEDURE:  02/26/2019      PREOPERATIVE DIAGNOSIS:   Primary Localized Osteoarthritis right Knee       Estimated body mass index is 23.73 kg/m as calculated from the following:   Height as of this encounter: 5\' 10"  (1.778 m).   Weight as of this encounter: 75 kg.                                                       POSTOPERATIVE DIAGNOSIS:   Same                                                                 PROCEDURE:  Procedure(s): TOTAL KNEE ARTHROPLASTY Using Depuy Attune RP implants #7 Femur, #8Tibia, 67mm  RP bearing, 38 Patella    SURGEON: Kalijah Zeiss A. Noemi Chapel, MD   ASSISTANT: Matthew Saras, PA-C, present and scrubbed throughout the case, critical for retraction, instrumentation, and closure.  ANESTHESIA: Spinal with Adductor Nerve Block  TOURNIQUET TIME: 58 minutes   COMPLICATIONS:  None       SPECIMENS: None   INDICATIONS FOR PROCEDURE: The patient has djd of the knee with varus deformities, XR shows bone on bone arthritis. Patient has failed all conservative measures including anti-inflammatory medicines, narcotics, attempts at exercise and weight loss, cortisone injections and viscosupplementation.  Risks and benefits of surgery have been discussed, questions answered.    DESCRIPTION OF PROCEDURE: The patient identified by armband, received right adductor canal block and IV antibiotics, in the holding area at [facility]. Patient taken to the operating room, appropriate anesthetic monitors were attached. Spinal anesthesia induced with the patient in supine position, Foley catheter was inserted. Tourniquet applied high to the operative thigh. Lateral post and foot positioner applied to the table, the lower extremity was then prepped and draped in usual sterile fashion from the ankle to the tourniquet. Time-out procedure was performed. The limb was wrapped with an Esmarch bandage  and the tourniquet inflated to 365 mmHg.   We began the operation by making a 6cm anterior midline incision. Small bleeders in the skin and the subcutaneous tissue identified and cauterized. Transverse retinaculum was incised and reflected medially and a medial parapatellar arthrotomy was accomplished. the patella was everted and theprepatellar fat pad resected. The superficial medial collateral ligament was then elevated from anterior to posterior along the proximal flare of the tibia and anterior half of the menisci resected. The knee was hyperflexed exposing bone on bone arthritis. Peripheral and notch osteophytes as well as the cruciate ligaments were then resected. We continued to work our way around posteriorly along the proximal tibia, and externally rotated the tibia subluxing it out from underneath the femur. A McHale retractor was placed through the notch and a lateral Hohmann retractor placed, and an external tibial guide was placed.  The tibial cutting guide was pinned into place allowing resection of 4 mm of bone medially and about 6 mm of bone laterally because of her  varus deformity.   Satisfied with the tibial resection, we then entered the distal femur 2 mm anterior to the PCL origin with the intramedullary guide rod and applied the distal femoral cutting guide set at 55mm, with 5 degrees of valgus. This was pinned along the epicondylar axis. At this point, the distal femoral cut was accomplished without difficulty. We then sized for a 7 femoral component and pinned the guide in 3 degrees of external rotation.The chamfer cutting guide was pinned into place. The anterior, posterior, and chamfer cuts were accomplished without difficulty followed by the  RP box cutting guide and the box cut. We also removed posterior osteophytes from the posterior femoral condyles. At this time, the knee was brought into full extension. We checked our extension and flexion gaps and found them symmetric at 8.  The  patella thickness measured at 67m m. We set the cutting guide at 15 and removed the posterior patella sized for 38 button and drilled the lollipop. The knee was then once again hyperflexed exposing the proximal tibia. We sized for a # 8 tibial base plate, applied the smokestack and the conical reamer followed by the the Delta fin keel punch. We then hammered into place the  RP trial femoral component, inserted a trial bearing, trial patellar button, and took the knee through range of motion from 0-130 degrees. No thumb pressure was required for patellar tracking.   At this point, all trial components were removed, a double batch of DePuy HV cement  was mixed and applied to all bony metallic mating surfaces. In order, we hammered into place the tibial tray and removed excess cement, the femoral component and removed excess cement, a 8 mm  RP bearing was inserted, and the knee brought to full extension with compression. The patellar button was clamped into place, and excess cement removed. While the cement cured the wound was irrigated out with normal saline solution pulse lavage, and exparel was injected throughout the knee. Ligament stability and patellar tracking were checked and found to be excellent..   The parapatellar arthrotomy was closed with  #1 Vicryl suture. The subcutaneous tissue with 0 and 2-0 undyed Vicryl suture, and 4-0 Monocryl.. A dressing of Aquaseal, 4 x 4, dressing sponges, Webril, and Ace wrap applied. Needle and sponge count were correct times 2.The patient awakened, extubated, and taken to recovery room without difficulty. Vascular status was normal, pulses 2+ and symmetric.    [name] 10/03/2017, 8:56 AM

## 2019-02-26 NOTE — Interval H&P Note (Signed)
History and Physical Interval Note:  02/26/2019 6:00 AM  Todd Lawrence  has presented today for surgery, with the diagnosis of djd right knee.  The various methods of treatment have been discussed with the patient and family. After consideration of risks, benefits and other options for treatment, the patient has consented to  Procedure(s): TOTAL KNEE ARTHROPLASTY (Right) as a surgical intervention.  The patient's history has been reviewed, patient examined, no change in status, stable for surgery.  I have reviewed the patient's chart and labs.  Questions were answered to the patient's satisfaction.     [name]

## 2019-02-27 ENCOUNTER — Encounter (HOSPITAL_COMMUNITY): Payer: Self-pay | Admitting: Orthopedic Surgery

## 2019-02-27 LAB — BASIC METABOLIC PANEL
Anion gap: 7 (ref 5–15)
BUN: 13 mg/dL (ref 8–23)
CO2: 25 mmol/L (ref 22–32)
Calcium: 8.8 mg/dL — ABNORMAL LOW (ref 8.9–10.3)
Chloride: 102 mmol/L (ref 98–111)
Creatinine, Ser: 0.71 mg/dL (ref 0.61–1.24)
GFR calc Af Amer: >60 mL/min (ref >60)
GFR calc non Af Amer: >60 mL/min (ref >60)
Glucose, Bld: 206 mg/dL — ABNORMAL HIGH (ref 70–99)
Potassium: 4.7 mmol/L (ref 3.5–5.1)
Sodium: 134 mmol/L — ABNORMAL LOW (ref 135–145)

## 2019-02-27 LAB — GLUCOSE, CAPILLARY: Glucose-Capillary: 178 mg/dL — ABNORMAL HIGH (ref 70–99)

## 2019-02-27 LAB — CBC
HCT: 34.2 % — ABNORMAL LOW (ref 39.0–52.0)
Hemoglobin: 11 g/dL — ABNORMAL LOW (ref 13.0–17.0)
MCH: 31.1 pg (ref 26.0–34.0)
MCHC: 32.2 g/dL (ref 30.0–36.0)
MCV: 96.6 fL (ref 80.0–100.0)
Platelets: 224 K/uL (ref 150–400)
RBC: 3.54 MIL/uL — ABNORMAL LOW (ref 4.22–5.81)
RDW: 12.8 % (ref 11.5–15.5)
WBC: 15.2 K/uL — ABNORMAL HIGH (ref 4.0–10.5)
nRBC: 0 % (ref 0.0–0.2)

## 2019-02-27 MED ORDER — GABAPENTIN 300 MG PO CAPS: ORAL_CAPSULE | ORAL | 0 refills | Status: DC

## 2019-02-27 MED ORDER — POLYETHYLENE GLYCOL 3350 17 G PO PACK: PACK | ORAL | 0 refills | Status: DC

## 2019-02-27 MED ORDER — OXYCODONE HCL 5 MG PO TABS: ORAL_TABLET | ORAL | 0 refills | Status: DC

## 2019-02-27 MED ORDER — ASPIRIN 325 MG PO TBEC: DELAYED_RELEASE_TABLET | ORAL | 0 refills | Status: DC

## 2019-02-27 MED ORDER — DOCUSATE SODIUM 100 MG PO CAPS: ORAL_CAPSULE | ORAL | 0 refills | Status: DC

## 2019-02-27 NOTE — Plan of Care (Signed)

## 2019-02-27 NOTE — Progress Notes (Signed)
Physical Therapy Treatment Patient Details Name: Todd Lawrence MRN: [id number] DOB: [birth year] Today's Date: 02/27/2019    History of Present Illness 68 yo male s/p R TKR on 02/26/19. PMH includes DM, lumbar DDD, HLD, preDM, L RTC tear, cervical fusion, L meniscus tear.    PT Comments    Pt ambulated in hallway, practiced safe stair technique, and performed LE exercises.  Pt reports understanding and feels ready for d/c home today.  Pt provided with HEP and had no further questions.   Follow Up Recommendations  Follow surgeon's recommendation for DC plan and follow-up therapies;Supervision for mobility/OOB     Equipment Recommendations  None recommended by PT    Recommendations for Other Services       Precautions / Restrictions Precautions Precautions: Fall;Knee Restrictions Other Position/Activity Restrictions: WBAT    Mobility  Bed Mobility               General bed mobility comments: pt up in recliner on arrival  Transfers Overall transfer level: Needs assistance Equipment used: Rolling walker (2 wheeled) Transfers: Sit to/from Stand Sit to Stand: Min guard;Supervision         General transfer comment: able to recall safety cues  Ambulation/Gait Ambulation/Gait assistance: Min guard;Supervision Gait Distance (Feet): 280 Feet Assistive device: Rolling walker (2 wheeled) Gait Pattern/deviations: Decreased weight shift to right;Trunk flexed;Step-through pattern     General Gait Details: verbal cues for posture and RW positioning   Stairs Stairs: Yes Stairs assistance: Min guard Stair Management: One rail Left;Step to pattern;Forwards;With cane Number of Stairs: 3 General stair comments: verbal cues for sequencing and safe technique, pt performed twice and reports understanding   Wheelchair Mobility    Modified Rankin (Stroke Patients Only)       Balance                                            Cognition  Arousal/Alertness: Awake/alert Behavior During Therapy: WFL for tasks assessed/performed Overall Cognitive Status: Within Functional Limits for tasks assessed                                        Exercises Total Joint Exercises Ankle Circles/Pumps: AROM;Both;10 reps Quad Sets: AROM;Right;10 reps Short Arc Quad: AROM;Right;10 reps Heel Slides: AAROM;Right;10 reps;Supine Hip ABduction/ADduction: AROM;Right;10 reps Straight Leg Raises: AROM;Right;10 reps Knee Flexion: AAROM;Seated;Right;10 reps    General Comments        Pertinent Vitals/Pain Pain Assessment: 0-10 Pain Score: 2  Pain Location: R knee Pain Descriptors / Indicators: Sore Pain Intervention(s): Repositioned;Monitored during session;Ice applied    Home Living                      Prior Function            PT Goals (current goals can now be found in the care plan section) Progress towards PT goals: Progressing toward goals    Frequency    7X/week      PT Plan Current plan remains appropriate    Co-evaluation              AM-PAC PT "6 Clicks" Mobility   Outcome Measure  Help needed turning from your back to your side while in a flat bed without using bedrails?: A Little  Help needed moving from lying on your back to sitting on the side of a flat bed without using bedrails?: A Little Help needed moving to and from a bed to a chair (including a wheelchair)?: A Little Help needed standing up from a chair using your arms (e.g., wheelchair or bedside chair)?: A Little Help needed to walk in hospital room?: A Little Help needed climbing 3-5 steps with a railing? : A Little 6 Click Score: 18    End of Session Equipment Utilized During Treatment: Gait belt Activity Tolerance: Patient tolerated treatment well Patient left: in chair;with call bell/phone within reach Nurse Communication: Mobility status PT Visit Diagnosis: Other abnormalities of gait and mobility  (R26.89);Difficulty in walking, not elsewhere classified (R26.2)     Time: 7672-0947 PT Time Calculation (min) (ACUTE ONLY): 24 min  Charges:  $Gait Training: 8-22 mins $Therapeutic Exercise: 8-22 mins                     Todd Lawrence, PT, DPT Acute Rehabilitation Services Office: [phone] Pager: [phone]  Todd Lawrence 02/27/2019, 1:05 PM

## 2019-02-27 NOTE — TOC Transition Note (Signed)
Transition of Care [facility]) - CM/SW Discharge Note   Patient Details  Name: Todd Lawrence MRN: [id number] Date of Birth: [birth year]  [facility] CM/SW Contact:  Leeroy Cha, RN Phone Number: 02/27/2019, 9:16 AM   Clinical Narrative:    hhc through [facility] and has needed equipment.   Final next level of care: [place] Barriers to Discharge: No Barriers Identified   Patient Goals and CMS Choice Patient states their goals for this hospitalization and ongoing recovery are:: going home CMS Medicare.gov Compare Post Acute Care list provided to:: Patient Choice offered to / list presented to : Patient  Discharge Placement                       Discharge Plan and Services   Discharge Planning Services: CM Consult Post Acute Care Choice: Durable Medical Equipment, Home Health          DME Arranged: Walker rolling, 3-N-1 DME Agency: [employer] Date DME Agency Contacted: 02/27/19 Time DME Agency Contacted: [phone]   HH Arranged: PT [place]: [place] ([place]) Date [place]: 02/27/19 Time [place]: [phone] Representative spoke with at [place]: [place] ([place]) Interventions     Readmission Risk Interventions No flowsheet data found.

## 2019-02-27 NOTE — Discharge Summary (Signed)
Patient ID: Todd Lawrence MRN: [id number] DOB/AGE: [birth year] 68 y.o.  Admit date: 02/26/2019 Discharge date: 02/27/2019  Admission Diagnoses:  Principal Problem:   Primary localized osteoarthritis of right knee Active Problems:   Diabetes mellitus without complication ([place])   DDD (degenerative disc disease), lumbar   Discharge Diagnoses:  Same  Past Medical History:  Diagnosis Date  . Acute meniscal tear of left knee   . DDD (degenerative disc disease), lumbar    has received ESI x 3  . Diabetes mellitus without complication ([place])   . GERD (gastroesophageal reflux disease)   . History of blood transfusion   . History of staph infection    after thumb surgery  . Hyperlipidemia   . Left rotator cuff tear   . Primary localized osteoarthritis of left knee     Surgeries: Procedure(s): TOTAL KNEE ARTHROPLASTY on 02/26/2019   Consultants:   Discharged Condition: Improved  Hospital Course: Todd Lawrence is an 68 y.o. male who was admitted 02/26/2019 for operative treatment ofPrimary localized osteoarthritis of right knee. Patient has severe unremitting pain that affects sleep, daily activities, and work/hobbies. After pre-op clearance the patient was taken to the operating room on 02/26/2019 and underwent  Procedure(s): TOTAL KNEE ARTHROPLASTY.    Patient was given perioperative antibiotics:  Anti-infectives (From admission, onward)   Start     Dose/Rate Route Frequency Ordered Stop   02/26/19 1000  ceFAZolin (ANCEF) IVPB 2g/100 mL premix     2 g 200 mL/hr over 30 Minutes Intravenous Every 6 hours 02/26/19 0928 02/26/19 2159   02/26/19 0600  ceFAZolin (ANCEF) IVPB 2g/100 mL premix     2 g 200 mL/hr over 30 Minutes Intravenous On call to O.R. 02/26/19 0534 02/26/19 0724   02/26/19 0539  ceFAZolin (ANCEF) 2-4 GM/100ML-% IVPB    Note to Pharmacy: Lavon Paganini   : cabinet override      02/26/19 0539 02/26/19 0724       Patient was given sequential compression devices, early  ambulation, and chemoprophylaxis to prevent DVT.  Patient benefited maximally from hospital stay and there were no complications.    Recent vital signs:  Patient Vitals for the past 24 hrs:  BP Temp Temp src Pulse Resp SpO2  02/27/19 0515 133/80 97.6 F (36.4 C) - 85 18 97 %  02/27/19 0133 123/75 97.6 F (36.4 C) - 90 16 97 %  02/26/19 2146 129/81 97.8 F (36.6 C) Oral 96 18 97 %  02/26/19 1358 123/78 97.8 F (36.6 C) Oral 86 16 99 %  02/26/19 1238 126/72 97.6 F (36.4 C) Oral 84 16 99 %  02/26/19 1138 119/80 97.6 F (36.4 C) Oral 75 16 100 %  02/26/19 1022 128/81 97.8 F (36.6 C) Oral 77 - 100 %  02/26/19 1000 115/68 98 F (36.7 C) - 79 16 100 %  02/26/19 0945 120/83 - - 80 20 97 %  02/26/19 0930 117/69 (!) 97.5 F (36.4 C) - 80 16 98 %  02/26/19 0921 116/70 (!) 97.5 F (36.4 C) - 80 15 97 %     Recent laboratory studies:  Recent Labs    02/27/19 0257  WBC 15.2*  HGB 11.0*  HCT 34.2*  PLT 224  NA 134*  K 4.7  CL 102  CO2 25  BUN 13  CREATININE 0.71  GLUCOSE 206*  CALCIUM 8.8*     Discharge Medications:   Allergies as of 02/27/2019   No Known Allergies     Medication  List    STOP taking these medications   aspirin 81 MG tablet Replaced by: aspirin 325 MG EC tablet   ibuprofen 200 MG tablet Commonly known as: ADVIL   OSTEO BI-FLEX REGULAR STRENGTH PO     TAKE these medications   acetaminophen 325 MG tablet Commonly known as: TYLENOL Take 650 mg by mouth every 6 (six) hours as needed.   [place] 0.025 % ophthalmic solution Generic drug: ketotifen Place 1 drop into both eyes daily as needed (itching).   aspirin 325 MG EC tablet 1 tab a day for the next 30 days to prevent blood clots Replaces: aspirin 81 MG tablet   atorvastatin 20 MG tablet Commonly known as: LIPITOR TAKE 1 TABLET EVERY DAY   calcium carbonate 500 MG chewable tablet Commonly known as: TUMS - dosed in mg elemental calcium Chew 3 tablets by mouth daily as needed for  indigestion or heartburn.   docusate sodium 100 MG capsule Commonly known as: COLACE 1 tab 2 times a day while on narcotics.  STOOL SOFTENER   gabapentin 300 MG capsule Commonly known as: NEURONTIN 1 po qhs for nerve pain   metFORMIN 500 MG tablet Commonly known as: GLUCOPHAGE TAKE 1 TABLET BY MOUTH TWICE A DAY WITH A MEAL What changed:   how much to take  how to take this  when to take this  additional instructions   multivitamin with minerals Tabs tablet Take 1 tablet by mouth daily.   omeprazole 40 MG capsule Commonly known as: PRILOSEC Take 1 capsule (40 mg total) by mouth daily. What changed:   when to take this  reasons to take this   oxyCODONE 5 MG immediate release tablet Commonly known as: Oxy IR/ROXICODONE 1 po q 4 hrs prn pain   polyethylene glycol 17 g packet Commonly known as: MIRALAX / GLYCOLAX 17grams in 6 oz of something to drink twice a day until bowel movement.  LAXITIVE.  Restart if two days since last bowel movement   Systane 0.4-0.3 % Gel ophthalmic gel Generic drug: Polyethyl Glycol-Propyl Glycol Place 1 application into both eyes at bedtime.   Systane Balance 0.6 % Soln Generic drug: Propylene Glycol Place 1 drop into both eyes daily.            Discharge Care Instructions  (From admission, onward)         Start     Ordered   02/27/19 0000  Change dressing    Comments: DO NOT REMOVE BANDAGE OVER SURGICAL INCISION.  [place] WHOLE LEG INCLUDING OVER THE WATERPROOF BANDAGE WITH SOAP AND WATER EVERY DAY.   02/27/19 0813          Diagnostic Studies: No results found.  Disposition: Discharge disposition: 01-Home or Self Care       Discharge Instructions    CPM   Complete by: As directed    Continuous passive motion machine (CPM):      Use the CPM from 0 to 90 for 6 hours per day.       You may break it up into 2 or 3 sessions per day.      Use CPM for 2 weeks or until you are told to stop.   Call MD / Call 911    Complete by: As directed    If you experience chest pain or shortness of breath, CALL 911 and be transported to the hospital emergency room.  If you develope a fever above 101 F, pus (white drainage) or increased drainage  or redness at the wound, or calf pain, call your surgeon's office.   Change dressing   Complete by: As directed    DO NOT REMOVE BANDAGE OVER SURGICAL INCISION.  [place] WHOLE LEG INCLUDING OVER THE WATERPROOF BANDAGE WITH SOAP AND WATER EVERY DAY.   Constipation Prevention   Complete by: As directed    Drink plenty of fluids.  Prune juice may be helpful.  You may use a stool softener, such as Colace (over the counter) 100 mg twice a day.  Use MiraLax (over the counter) for constipation as needed.   Diet - low sodium heart healthy   Complete by: As directed    Discharge instructions   Complete by: As directed    INSTRUCTIONS AFTER JOINT REPLACEMENT   Remove items at home which could result in a fall. This includes throw rugs or furniture in walking pathways ICE to the affected joint every three hours while awake for 30 minutes at a time, for at least the first 3-5 days, and then as needed for pain and swelling.  Continue to use ice for pain and swelling. You may notice swelling that will progress down to the foot and ankle.  This is normal after surgery.  Elevate your leg when you are not up walking on it.   Continue to use the breathing machine you got in the hospital (incentive spirometer) which will help keep your temperature down.  It is common for your temperature to cycle up and down following surgery, especially at night when you are not up moving around and exerting yourself.  The breathing machine keeps your lungs expanded and your temperature down.   DIET:  As you were doing prior to hospitalization, we recommend a well-balanced diet.  DRESSING / WOUND CARE / SHOWERING  Keep the surgical dressing until follow up.  The dressing is water proof, so you can shower without  any extra covering.  IF THE DRESSING FALLS OFF or the wound gets wet inside, change the dressing with sterile gauze.  Please use good hand washing techniques before changing the dressing.  Do not use any lotions or creams on the incision until instructed by your surgeon.    ACTIVITY  Increase activity slowly as tolerated, but follow the weight bearing instructions below.   No driving for 6 weeks or until further direction given by your physician.  You cannot drive while taking narcotics.  No lifting or carrying greater than 10 lbs. until further directed by your surgeon. Avoid periods of inactivity such as sitting longer than an hour when not asleep. This helps prevent blood clots.  You may return to work once you are authorized by your doctor.     WEIGHT BEARING   Weight bearing as tolerated with assist device (walker, cane, etc) as directed, use it as long as suggested by your surgeon or therapist, typically at least 2-3 weeks.   EXERCISES  Results after joint replacement surgery are often greatly improved when you follow the exercise, range of motion and muscle strengthening exercises prescribed by your doctor. Safety measures are also important to protect the joint from further injury. Any time any of these exercises cause you to have increased pain or swelling, decrease what you are doing until you are comfortable again and then slowly increase them. If you have problems or questions, call your caregiver or physical therapist for advice.   Rehabilitation is important following a joint replacement. After just a few days of immobilization, the muscles of the  leg can become weakened and shrink (atrophy).  These exercises are designed to build up the tone and strength of the thigh and leg muscles and to improve motion. Often times heat used for twenty to thirty minutes before working out will loosen up your tissues and help with improving the range of motion but do not use heat for the first  two weeks following surgery (sometimes heat can increase post-operative swelling).   These exercises can be done on a training (exercise) mat, on the floor, on a table or on a bed. Use whatever works the best and is most comfortable for you.    Use music or television while you are exercising so that the exercises are a pleasant break in your day. This will make your life better with the exercises acting as a break in your routine that you can look forward to.   Perform all exercises about fifteen times, three times per day or as directed.  You should exercise both the operative leg and the other leg as well.   Exercises include:  Quad Sets - Tighten up the muscle on the front of the thigh (Quad) and hold for 5-10 seconds.   Straight Leg Raises - With your knee straight (if you were given a brace, keep it on), lift the leg to 60 degrees, hold for 3 seconds, and slowly lower the leg.  Perform this exercise against resistance later as your leg gets stronger.  Leg Slides: Lying on your back, slowly slide your foot toward your buttocks, bending your knee up off the floor (only go as far as is comfortable). Then slowly slide your foot back down until your leg is flat on the floor again.  Angel Wings: Lying on your back spread your legs to the side as far apart as you can without causing discomfort.  Hamstring Strength:  Lying on your back, push your heel against the floor with your leg straight by tightening up the muscles of your buttocks.  Repeat, but this time bend your knee to a comfortable angle, and push your heel against the floor.  You may put a pillow under the heel to make it more comfortable if necessary.   A rehabilitation program following joint replacement surgery can speed recovery and prevent re-injury in the future due to weakened muscles. Contact your doctor or a physical therapist for more information on knee rehabilitation.    CONSTIPATION  Constipation is defined medically as fewer  than three stools per week and severe constipation as less than one stool per week.  Even if you have a regular bowel pattern at home, your normal regimen is likely to be disrupted due to multiple reasons following surgery.  Combination of anesthesia, postoperative narcotics, change in appetite and fluid intake all can affect your bowels.   YOU MUST use at least one of the following options; they are listed in order of increasing strength to get the job done.  They are all available over the counter, and you may need to use some, POSSIBLY even all of these options:    Drink plenty of fluids (prune juice may be helpful) and high fiber foods Colace 100 mg by mouth twice a day  Senokot for constipation as directed and as needed Dulcolax (bisacodyl), take with full glass of water  Miralax (polyethylene glycol) once or twice a day as needed.  If you have tried all these things and are unable to have a bowel movement in the first 3-4 days after  surgery call either your surgeon or your primary doctor.    If you experience loose stools or diarrhea, hold the medications until you stool forms back up.  If your symptoms do not get better within 1 week or if they get worse, check with your doctor.  If you experience "the worst abdominal pain ever" or develop nausea or vomiting, please contact the office immediately for further recommendations for treatment.   ITCHING:  If you experience itching with your medications, try taking only a single pain pill, or even half a pain pill at a time.  You can also use Benadryl over the counter for itching or also to help with sleep.   TED HOSE STOCKINGS:  Use stockings on both legs until for at least 2 weeks or as directed by physician office. They may be removed at night for sleeping.  MEDICATIONS:  See your medication summary on the "After Visit Summary" that nursing will review with you.  You may have some home medications which will be placed on hold until you complete  the course of blood thinner medication.  It is important for you to complete the blood thinner medication as prescribed.  PRECAUTIONS:  If you experience chest pain or shortness of breath - call 911 immediately for transfer to the hospital emergency department.   If you develop a fever greater that 101 F, purulent drainage from wound, increased redness or drainage from wound, foul odor from the wound/dressing, or calf pain - CONTACT YOUR SURGEON.                                                   FOLLOW-UP APPOINTMENTS:  If you do not already have a post-op appointment, please call the office for an appointment to be seen by your surgeon.  Guidelines for how soon to be seen are listed in your "After Visit Summary", but are typically between 1-4 weeks after surgery.  OTHER INSTRUCTIONS:   Knee Replacement:  Do not place pillow under knee, focus on keeping the knee straight while resting. CPM instructions: 0-90 degrees, 2 hours in the morning, 2 hours in the afternoon, and 2 hours in the evening. Place foam block, curve side up under heel at all times except when in CPM or when walking.  DO NOT modify, tear, cut, or change the foam block in any way.  MAKE SURE YOU:  Understand these instructions.  Get help right away if you are not doing well or get worse.    Thank you for letting us be a part of your medical care team.  It is a privilege we respect greatly.  We hope these instructions will help you stay on track for a fast and full recovery!   Do not put a pillow under the knee. Place it under the heel.   Complete by: As directed    Place gray foam block, curve side up under heel at all times except when in CPM or when walking.  DO NOT modify, tear, cut, or change in any way the gray foam block.   Increase activity slowly as tolerated   Complete by: As directed    Patient may shower   Complete by: As directed    Aquacel dressing is water proof    Wash over it and the whole leg with soap and  water at  the end of your shower   TED hose   Complete by: As directed    Use stockings (TED hose) for 2 weeks on both leg(s).  You may remove them at night for sleeping.      Follow-up Information    Elsie Saas, MD. Go on 03/12/2019.   Specialty: Orthopedic Surgery Why: Your appointment has been scheduled for 2:15. Contact information: [place] [address] [phone]        [facility] Follow up.   Specialty: [place] Why: You will be seen at home by [place] for 5 visits prior to starting outpatient physical therapy  Contact information: [address] [place] [address] [phone]        [facility], [place]. Go on 03/12/2019.   Why: You are scheduled to start outpatient physical therapy at 1:00.  Contact information: [facility] [place] [address] [phone]            Signed: Linda Hedges 02/27/2019, 8:14 AM

## 2019-03-01 DIAGNOSIS — Z471 Aftercare following joint replacement surgery: Secondary | ICD-10-CM | POA: Diagnosis not present

## 2019-03-01 DIAGNOSIS — Z7984 Long term (current) use of oral hypoglycemic drugs: Secondary | ICD-10-CM | POA: Diagnosis not present

## 2019-03-01 DIAGNOSIS — M5136 Other intervertebral disc degeneration, lumbar region: Secondary | ICD-10-CM | POA: Diagnosis not present

## 2019-03-01 DIAGNOSIS — Z96651 Presence of right artificial knee joint: Secondary | ICD-10-CM | POA: Diagnosis not present

## 2019-03-01 DIAGNOSIS — E119 Type 2 diabetes mellitus without complications: Secondary | ICD-10-CM | POA: Diagnosis not present

## 2019-03-01 DIAGNOSIS — Z7982 Long term (current) use of aspirin: Secondary | ICD-10-CM | POA: Diagnosis not present

## 2019-03-02 DIAGNOSIS — Z7982 Long term (current) use of aspirin: Secondary | ICD-10-CM | POA: Diagnosis not present

## 2019-03-02 DIAGNOSIS — M5136 Other intervertebral disc degeneration, lumbar region: Secondary | ICD-10-CM | POA: Diagnosis not present

## 2019-03-02 DIAGNOSIS — E119 Type 2 diabetes mellitus without complications: Secondary | ICD-10-CM | POA: Diagnosis not present

## 2019-03-02 DIAGNOSIS — Z96651 Presence of right artificial knee joint: Secondary | ICD-10-CM | POA: Diagnosis not present

## 2019-03-02 DIAGNOSIS — Z471 Aftercare following joint replacement surgery: Secondary | ICD-10-CM | POA: Diagnosis not present

## 2019-03-02 DIAGNOSIS — Z7984 Long term (current) use of oral hypoglycemic drugs: Secondary | ICD-10-CM | POA: Diagnosis not present

## 2019-03-05 DIAGNOSIS — Z471 Aftercare following joint replacement surgery: Secondary | ICD-10-CM | POA: Diagnosis not present

## 2019-03-05 DIAGNOSIS — Z96651 Presence of right artificial knee joint: Secondary | ICD-10-CM | POA: Diagnosis not present

## 2019-03-05 DIAGNOSIS — E119 Type 2 diabetes mellitus without complications: Secondary | ICD-10-CM | POA: Diagnosis not present

## 2019-03-05 DIAGNOSIS — Z7982 Long term (current) use of aspirin: Secondary | ICD-10-CM | POA: Diagnosis not present

## 2019-03-05 DIAGNOSIS — M5136 Other intervertebral disc degeneration, lumbar region: Secondary | ICD-10-CM | POA: Diagnosis not present

## 2019-03-05 DIAGNOSIS — Z7984 Long term (current) use of oral hypoglycemic drugs: Secondary | ICD-10-CM | POA: Diagnosis not present

## 2019-03-07 DIAGNOSIS — M5136 Other intervertebral disc degeneration, lumbar region: Secondary | ICD-10-CM | POA: Diagnosis not present

## 2019-03-07 DIAGNOSIS — Z96651 Presence of right artificial knee joint: Secondary | ICD-10-CM | POA: Diagnosis not present

## 2019-03-07 DIAGNOSIS — Z7984 Long term (current) use of oral hypoglycemic drugs: Secondary | ICD-10-CM | POA: Diagnosis not present

## 2019-03-07 DIAGNOSIS — Z471 Aftercare following joint replacement surgery: Secondary | ICD-10-CM | POA: Diagnosis not present

## 2019-03-07 DIAGNOSIS — E119 Type 2 diabetes mellitus without complications: Secondary | ICD-10-CM | POA: Diagnosis not present

## 2019-03-07 DIAGNOSIS — Z7982 Long term (current) use of aspirin: Secondary | ICD-10-CM | POA: Diagnosis not present

## 2019-03-09 DIAGNOSIS — Z471 Aftercare following joint replacement surgery: Secondary | ICD-10-CM | POA: Diagnosis not present

## 2019-03-09 DIAGNOSIS — Z7982 Long term (current) use of aspirin: Secondary | ICD-10-CM | POA: Diagnosis not present

## 2019-03-09 DIAGNOSIS — M5136 Other intervertebral disc degeneration, lumbar region: Secondary | ICD-10-CM | POA: Diagnosis not present

## 2019-03-09 DIAGNOSIS — E119 Type 2 diabetes mellitus without complications: Secondary | ICD-10-CM | POA: Diagnosis not present

## 2019-03-09 DIAGNOSIS — Z96651 Presence of right artificial knee joint: Secondary | ICD-10-CM | POA: Diagnosis not present

## 2019-03-09 DIAGNOSIS — Z7984 Long term (current) use of oral hypoglycemic drugs: Secondary | ICD-10-CM | POA: Diagnosis not present

## 2019-03-12 DIAGNOSIS — M1711 Unilateral primary osteoarthritis, right knee: Secondary | ICD-10-CM | POA: Diagnosis not present

## 2019-03-12 DIAGNOSIS — M6281 Muscle weakness (generalized): Secondary | ICD-10-CM | POA: Diagnosis not present

## 2019-03-12 DIAGNOSIS — M25561 Pain in right knee: Secondary | ICD-10-CM | POA: Diagnosis not present

## 2019-03-12 DIAGNOSIS — M25661 Stiffness of right knee, not elsewhere classified: Secondary | ICD-10-CM | POA: Diagnosis not present

## 2019-03-14 DIAGNOSIS — M25661 Stiffness of right knee, not elsewhere classified: Secondary | ICD-10-CM | POA: Diagnosis not present

## 2019-03-14 DIAGNOSIS — M25561 Pain in right knee: Secondary | ICD-10-CM | POA: Diagnosis not present

## 2019-03-14 DIAGNOSIS — M1711 Unilateral primary osteoarthritis, right knee: Secondary | ICD-10-CM | POA: Diagnosis not present

## 2019-03-14 DIAGNOSIS — M6281 Muscle weakness (generalized): Secondary | ICD-10-CM | POA: Diagnosis not present

## 2019-03-20 DIAGNOSIS — H04123 Dry eye syndrome of bilateral lacrimal glands: Secondary | ICD-10-CM | POA: Diagnosis not present

## 2019-03-20 DIAGNOSIS — H02203 Unspecified lagophthalmos right eye, unspecified eyelid: Secondary | ICD-10-CM | POA: Diagnosis not present

## 2019-03-20 DIAGNOSIS — H02206 Unspecified lagophthalmos left eye, unspecified eyelid: Secondary | ICD-10-CM | POA: Diagnosis not present

## 2019-03-20 DIAGNOSIS — H11043 Peripheral pterygium, stationary, bilateral: Secondary | ICD-10-CM | POA: Diagnosis not present

## 2019-03-20 DIAGNOSIS — S0502XA Injury of conjunctiva and corneal abrasion without foreign body, left eye, initial encounter: Secondary | ICD-10-CM | POA: Diagnosis not present

## 2019-03-20 DIAGNOSIS — H10413 Chronic giant papillary conjunctivitis, bilateral: Secondary | ICD-10-CM | POA: Diagnosis not present

## 2019-03-21 DIAGNOSIS — M6281 Muscle weakness (generalized): Secondary | ICD-10-CM | POA: Diagnosis not present

## 2019-03-21 DIAGNOSIS — M25661 Stiffness of right knee, not elsewhere classified: Secondary | ICD-10-CM | POA: Diagnosis not present

## 2019-03-21 DIAGNOSIS — M25561 Pain in right knee: Secondary | ICD-10-CM | POA: Diagnosis not present

## 2019-03-21 DIAGNOSIS — M1711 Unilateral primary osteoarthritis, right knee: Secondary | ICD-10-CM | POA: Diagnosis not present

## 2019-03-22 DIAGNOSIS — M1711 Unilateral primary osteoarthritis, right knee: Secondary | ICD-10-CM | POA: Diagnosis not present

## 2019-03-22 DIAGNOSIS — M25661 Stiffness of right knee, not elsewhere classified: Secondary | ICD-10-CM | POA: Diagnosis not present

## 2019-03-22 DIAGNOSIS — M25561 Pain in right knee: Secondary | ICD-10-CM | POA: Diagnosis not present

## 2019-03-22 DIAGNOSIS — S0502XD Injury of conjunctiva and corneal abrasion without foreign body, left eye, subsequent encounter: Secondary | ICD-10-CM | POA: Diagnosis not present

## 2019-03-22 DIAGNOSIS — M6281 Muscle weakness (generalized): Secondary | ICD-10-CM | POA: Diagnosis not present

## 2019-03-26 DIAGNOSIS — M25661 Stiffness of right knee, not elsewhere classified: Secondary | ICD-10-CM | POA: Diagnosis not present

## 2019-03-26 DIAGNOSIS — M25561 Pain in right knee: Secondary | ICD-10-CM | POA: Diagnosis not present

## 2019-03-26 DIAGNOSIS — M1711 Unilateral primary osteoarthritis, right knee: Secondary | ICD-10-CM | POA: Diagnosis not present

## 2019-03-26 DIAGNOSIS — M6281 Muscle weakness (generalized): Secondary | ICD-10-CM | POA: Diagnosis not present

## 2019-03-28 DIAGNOSIS — E119 Type 2 diabetes mellitus without complications: Secondary | ICD-10-CM | POA: Diagnosis not present

## 2019-03-28 DIAGNOSIS — S0502XD Injury of conjunctiva and corneal abrasion without foreign body, left eye, subsequent encounter: Secondary | ICD-10-CM | POA: Diagnosis not present

## 2019-03-28 DIAGNOSIS — D3131 Benign neoplasm of right choroid: Secondary | ICD-10-CM | POA: Diagnosis not present

## 2019-03-28 DIAGNOSIS — H04123 Dry eye syndrome of bilateral lacrimal glands: Secondary | ICD-10-CM | POA: Diagnosis not present

## 2019-03-28 DIAGNOSIS — H524 Presbyopia: Secondary | ICD-10-CM | POA: Diagnosis not present

## 2019-03-28 DIAGNOSIS — H52203 Unspecified astigmatism, bilateral: Secondary | ICD-10-CM | POA: Diagnosis not present

## 2019-03-28 DIAGNOSIS — H5203 Hypermetropia, bilateral: Secondary | ICD-10-CM | POA: Diagnosis not present

## 2019-03-28 DIAGNOSIS — H25813 Combined forms of age-related cataract, bilateral: Secondary | ICD-10-CM | POA: Diagnosis not present

## 2019-03-28 DIAGNOSIS — D3132 Benign neoplasm of left choroid: Secondary | ICD-10-CM | POA: Diagnosis not present

## 2019-03-28 LAB — HM DIABETES EYE EXAM

## 2019-03-28 LAB — HM COLONOSCOPY

## 2019-03-29 DIAGNOSIS — M1711 Unilateral primary osteoarthritis, right knee: Secondary | ICD-10-CM | POA: Diagnosis not present

## 2019-03-29 DIAGNOSIS — M25561 Pain in right knee: Secondary | ICD-10-CM | POA: Diagnosis not present

## 2019-03-29 DIAGNOSIS — M25661 Stiffness of right knee, not elsewhere classified: Secondary | ICD-10-CM | POA: Diagnosis not present

## 2019-03-29 DIAGNOSIS — M6281 Muscle weakness (generalized): Secondary | ICD-10-CM | POA: Diagnosis not present

## 2019-04-02 DIAGNOSIS — M1711 Unilateral primary osteoarthritis, right knee: Secondary | ICD-10-CM | POA: Diagnosis not present

## 2019-04-02 DIAGNOSIS — M25661 Stiffness of right knee, not elsewhere classified: Secondary | ICD-10-CM | POA: Diagnosis not present

## 2019-04-02 DIAGNOSIS — M6281 Muscle weakness (generalized): Secondary | ICD-10-CM | POA: Diagnosis not present

## 2019-04-02 DIAGNOSIS — M25561 Pain in right knee: Secondary | ICD-10-CM | POA: Diagnosis not present

## 2019-04-05 DIAGNOSIS — M25561 Pain in right knee: Secondary | ICD-10-CM | POA: Diagnosis not present

## 2019-04-05 DIAGNOSIS — M25661 Stiffness of right knee, not elsewhere classified: Secondary | ICD-10-CM | POA: Diagnosis not present

## 2019-04-05 DIAGNOSIS — M1711 Unilateral primary osteoarthritis, right knee: Secondary | ICD-10-CM | POA: Diagnosis not present

## 2019-04-05 DIAGNOSIS — M6281 Muscle weakness (generalized): Secondary | ICD-10-CM | POA: Diagnosis not present

## 2019-04-09 DIAGNOSIS — M1711 Unilateral primary osteoarthritis, right knee: Secondary | ICD-10-CM | POA: Diagnosis not present

## 2019-04-15 ENCOUNTER — Other Ambulatory Visit: Payer: Self-pay | Admitting: Family Medicine

## 2019-04-18 DIAGNOSIS — M25661 Stiffness of right knee, not elsewhere classified: Secondary | ICD-10-CM | POA: Diagnosis not present

## 2019-04-18 DIAGNOSIS — M1711 Unilateral primary osteoarthritis, right knee: Secondary | ICD-10-CM | POA: Diagnosis not present

## 2019-04-18 DIAGNOSIS — M25561 Pain in right knee: Secondary | ICD-10-CM | POA: Diagnosis not present

## 2019-04-18 DIAGNOSIS — M6281 Muscle weakness (generalized): Secondary | ICD-10-CM | POA: Diagnosis not present

## 2019-04-23 DIAGNOSIS — M25661 Stiffness of right knee, not elsewhere classified: Secondary | ICD-10-CM | POA: Diagnosis not present

## 2019-04-23 DIAGNOSIS — M25561 Pain in right knee: Secondary | ICD-10-CM | POA: Diagnosis not present

## 2019-04-23 DIAGNOSIS — M1711 Unilateral primary osteoarthritis, right knee: Secondary | ICD-10-CM | POA: Diagnosis not present

## 2019-04-23 DIAGNOSIS — M6281 Muscle weakness (generalized): Secondary | ICD-10-CM | POA: Diagnosis not present

## 2019-05-07 DIAGNOSIS — M1711 Unilateral primary osteoarthritis, right knee: Secondary | ICD-10-CM | POA: Diagnosis not present

## 2019-05-30 DIAGNOSIS — R69 Illness, unspecified: Secondary | ICD-10-CM | POA: Diagnosis not present

## 2019-08-06 ENCOUNTER — Ambulatory Visit: Payer: Medicare HMO | Attending: Internal Medicine

## 2019-08-06 DIAGNOSIS — Z23 Encounter for immunization: Secondary | ICD-10-CM | POA: Insufficient documentation

## 2019-08-06 NOTE — Progress Notes (Signed)
   Covid-19 Vaccination Clinic  Name:  Todd Lawrence    MRN: [id number] DOB: 20-Jan-1951  08/06/2019  [name] was observed post Covid-19 immunization for 15 minutes without incidence. He was provided with Vaccine Information Sheet and instruction to access the V-Safe system.   [name] was instructed to call 911 with any severe reactions post vaccine: Marland Kitchen Difficulty breathing  . Swelling of your face and throat  . A fast heartbeat  . A bad rash all over your body  . Dizziness and weakness    Immunizations Administered    Name Date Dose VIS Date Route   Pfizer COVID-19 Vaccine 08/06/2019  3:18 PM 0.3 mL 06/22/2019 Intramuscular   Manufacturer: [place]   Lot: [id number]   [place]: [id number]

## 2019-08-27 ENCOUNTER — Ambulatory Visit: Payer: Medicare HMO | Attending: Internal Medicine

## 2019-08-27 DIAGNOSIS — Z23 Encounter for immunization: Secondary | ICD-10-CM | POA: Insufficient documentation

## 2019-08-27 NOTE — Progress Notes (Signed)
   Covid-19 Vaccination Clinic  Name:  OSBOURNE HOLTZCLAW    MRN: [id number] DOB: [birth year]  08/27/2019  Mr. Shaefer was observed post Covid-19 immunization for 15 minutes without incidence. He was provided with Vaccine Information Sheet and instruction to access the V-Safe system.   Mr. Halfacre was instructed to call 911 with any severe reactions post vaccine: Marland Kitchen Difficulty breathing  . Swelling of your face and throat  . A fast heartbeat  . A bad rash all over your body  . Dizziness and weakness    Immunizations Administered    Name Date Dose VIS Date Route   Pfizer COVID-19 Vaccine 08/27/2019 12:40 PM 0.3 mL 06/22/2019 Intramuscular   Manufacturer: [place]   Lot: [id number]   [place]: [id number]

## 2019-09-10 ENCOUNTER — Encounter: Payer: Self-pay | Admitting: Family Medicine

## 2019-10-11 ENCOUNTER — Other Ambulatory Visit: Payer: Self-pay | Admitting: Family Medicine

## 2019-10-26 ENCOUNTER — Telehealth: Payer: Self-pay | Admitting: Family Medicine

## 2019-10-26 NOTE — Telephone Encounter (Signed)
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 He states that he is not having any urinary symptoms at all and was wondering if he needed to come in or can it wait until his CPE on 12/04/2019?

## 2019-10-29 ENCOUNTER — Other Ambulatory Visit: Payer: Self-pay | Admitting: Family Medicine

## 2019-10-29 DIAGNOSIS — R319 Hematuria, unspecified: Secondary | ICD-10-CM

## 2019-10-29 NOTE — Telephone Encounter (Signed)
Please repeat a UA this week to see if persistent.

## 2019-10-29 NOTE — Telephone Encounter (Signed)
Pt aware via vm - instructed to come in this at his convenience. Order placed.

## 2019-10-30 ENCOUNTER — Other Ambulatory Visit: Payer: Self-pay

## 2019-10-30 ENCOUNTER — Other Ambulatory Visit: Payer: Medicare HMO

## 2019-10-30 DIAGNOSIS — R319 Hematuria, unspecified: Secondary | ICD-10-CM

## 2019-10-31 LAB — URINE CULTURE
MICRO NUMBER:: 10384167
Result:: NO GROWTH
SPECIMEN QUALITY:: ADEQUATE

## 2019-10-31 LAB — URINALYSIS, ROUTINE W REFLEX MICROSCOPIC
Bilirubin Urine: NEGATIVE
Glucose, UA: NEGATIVE
Hgb urine dipstick: NEGATIVE
Ketones, ur: NEGATIVE
Leukocytes,Ua: NEGATIVE
Nitrite: NEGATIVE
Protein, ur: NEGATIVE
Specific Gravity, Urine: 1.022 (ref 1.001–1.03)
pH: 6 (ref 5.0–8.0)

## 2019-11-05 DIAGNOSIS — M1711 Unilateral primary osteoarthritis, right knee: Secondary | ICD-10-CM | POA: Diagnosis not present

## 2019-11-28 ENCOUNTER — Other Ambulatory Visit: Payer: Self-pay

## 2019-11-28 ENCOUNTER — Other Ambulatory Visit: Payer: Medicare HMO

## 2019-11-28 DIAGNOSIS — Z0000 Encounter for general adult medical examination without abnormal findings: Secondary | ICD-10-CM | POA: Diagnosis not present

## 2019-11-28 DIAGNOSIS — Z125 Encounter for screening for malignant neoplasm of prostate: Secondary | ICD-10-CM | POA: Diagnosis not present

## 2019-11-28 DIAGNOSIS — E119 Type 2 diabetes mellitus without complications: Secondary | ICD-10-CM

## 2019-11-29 LAB — CBC WITH DIFFERENTIAL/PLATELET
Absolute Monocytes: 583 cells/uL (ref 200–950)
Basophils Absolute: 32 cells/uL (ref 0–200)
Basophils Relative: 0.6 %
Eosinophils Absolute: 151 cells/uL (ref 15–500)
Eosinophils Relative: 2.8 %
HCT: 40.7 % (ref 38.5–50.0)
Hemoglobin: 13.5 g/dL (ref 13.2–17.1)
Lymphs Abs: 1280 cells/uL (ref 850–3900)
MCH: 30.7 pg (ref 27.0–33.0)
MCHC: 33.2 g/dL (ref 32.0–36.0)
MCV: 92.5 fL (ref 80.0–100.0)
MPV: 10.7 fL (ref 7.5–12.5)
Monocytes Relative: 10.8 %
Neutro Abs: 3353 cells/uL (ref 1500–7800)
Neutrophils Relative %: 62.1 %
Platelets: 240 10*3/uL (ref 140–400)
RBC: 4.4 10*6/uL (ref 4.20–5.80)
RDW: 12.4 % (ref 11.0–15.0)
Total Lymphocyte: 23.7 %
WBC: 5.4 10*3/uL (ref 3.8–10.8)

## 2019-11-29 LAB — LIPID PANEL
Cholesterol: 179 mg/dL (ref <200)
HDL: 60 mg/dL (ref >=40)
LDL Cholesterol (Calc): 103 mg/dL (calc) — ABNORMAL HIGH
Non-HDL Cholesterol (Calc): 119 mg/dL (calc) (ref <130)
Total CHOL/HDL Ratio: 3 (calc) (ref <5.0)
Triglycerides: 69 mg/dL (ref <150)

## 2019-11-29 LAB — COMPREHENSIVE METABOLIC PANEL
AG Ratio: 1.9 (calc) (ref 1.0–2.5)
ALT: 15 U/L (ref 9–46)
AST: 18 U/L (ref 10–35)
Albumin: 4.2 g/dL (ref 3.6–5.1)
Alkaline phosphatase (APISO): 68 U/L (ref 35–144)
BUN: 13 mg/dL (ref 7–25)
CO2: 28 mmol/L (ref 20–32)
Calcium: 9.5 mg/dL (ref 8.6–10.3)
Chloride: 97 mmol/L — ABNORMAL LOW (ref 98–110)
Creat: 0.89 mg/dL (ref 0.70–1.18)
Globulin: 2.2 g/dL (calc) (ref 1.9–3.7)
Glucose, Bld: 247 mg/dL — ABNORMAL HIGH (ref 65–99)
Potassium: 4.8 mmol/L (ref 3.5–5.3)
Sodium: 134 mmol/L — ABNORMAL LOW (ref 135–146)
Total Bilirubin: 0.5 mg/dL (ref 0.2–1.2)
Total Protein: 6.4 g/dL (ref 6.1–8.1)

## 2019-11-29 LAB — HEMOGLOBIN A1C
Hgb A1c MFr Bld: 7.2 % of total Hgb — ABNORMAL HIGH (ref <5.7)
Mean Plasma Glucose: 160 (calc)
eAG (mmol/L): 8.9 (calc)

## 2019-11-29 LAB — PSA: PSA: 0.3 ng/mL (ref <=4.0)

## 2019-12-04 ENCOUNTER — Other Ambulatory Visit: Payer: Self-pay

## 2019-12-04 ENCOUNTER — Ambulatory Visit (INDEPENDENT_AMBULATORY_CARE_PROVIDER_SITE_OTHER): Payer: Medicare HMO | Admitting: Family Medicine

## 2019-12-04 VITALS — BP 162/88 | HR 72 | Temp 98.1°F | Wt 171.0 lb

## 2019-12-04 DIAGNOSIS — S68012S Complete traumatic metacarpophalangeal amputation of left thumb, sequela: Secondary | ICD-10-CM | POA: Diagnosis not present

## 2019-12-04 DIAGNOSIS — Z0000 Encounter for general adult medical examination without abnormal findings: Secondary | ICD-10-CM

## 2019-12-04 DIAGNOSIS — Z0001 Encounter for general adult medical examination with abnormal findings: Secondary | ICD-10-CM | POA: Diagnosis not present

## 2019-12-04 DIAGNOSIS — E119 Type 2 diabetes mellitus without complications: Secondary | ICD-10-CM | POA: Diagnosis not present

## 2019-12-04 DIAGNOSIS — E7800 Pure hypercholesterolemia, unspecified: Secondary | ICD-10-CM | POA: Diagnosis not present

## 2019-12-04 MED ORDER — METFORMIN HCL 1000 MG PO TABS
1000.0000 mg | ORAL_TABLET | Freq: Two times a day (BID) | ORAL | 3 refills | Status: DC
Start: 2019-12-04 — End: 2021-02-10

## 2019-12-04 NOTE — Progress Notes (Signed)
Subjective:    Patient ID: Todd Lawrence, male    DOB: 05/09/1951, 69 y.o.   MRN: [id number]  HPI  Patient is a very pleasant 69 year old Caucasian male here today for complete physical exam.  His last colonoscopy was in 2019.  He denies any falls.  He denies any depression.  He denies any anhedonia.  He denies any memory loss.  Overall he has been doing very well with no concerns.  His immunization records are listed below.  Also his most recent lab work is listed below.  Lab work is significant for mildly elevated LDL cholesterol of 103 as well as a mild elevation in his hemoglobin A1c to 7.2.  His blood pressure today is elevated at 162/88.  I rechecked this after her physical and it was still elevated however he attributes this to whitecoat syndrome.  He would like to start checking his blood pressure at home prior to adding any additional medication.  He would benefit from an ACE inhibitor given his history of diabetes however he is hesitant to have medication until he confirms that his blood pressure is high at home. Lab on 11/28/2019  Component Date Value Ref Range Status  . PSA 11/28/2019 0.3  < OR = 4.0 ng/mL Final   Comment: The total PSA value from this assay system is  standardized against the WHO standard. The test  result will be approximately 20% lower when compared  to the equimolar-standardized total PSA (Beckman  Coulter). Comparison of serial PSA results should be  interpreted with this fact in mind. . This test was performed using the Siemens  chemiluminescent method. Values obtained from  different assay methods cannot be used interchangeably. PSA levels, regardless of value, should not be interpreted as absolute evidence of the presence or absence of disease.   . Glucose, Bld 11/28/2019 247* 65 - 99 mg/dL Final   Comment: .            Fasting reference interval . For someone without known diabetes, a glucose value >125 mg/dL indicates that they may have diabetes  and this should be confirmed with a follow-up test. .   . BUN 11/28/2019 13  7 - 25 mg/dL Final  . Creat 11/28/2019 0.89  0.70 - 1.18 mg/dL Final   Comment: For patients 71 years of age, the reference limit for Creatinine is approximately 13% higher for people identified as African-American. .   Havery Moros Ratio [id number] NOT APPLICABLE  6 - 22 (calc) Final  . Sodium 11/28/2019 134* 135 - 146 mmol/L Final  . Potassium 11/28/2019 4.8  3.5 - 5.3 mmol/L Final  . Chloride 11/28/2019 97* 98 - 110 mmol/L Final  . CO2 11/28/2019 28  20 - 32 mmol/L Final  . Calcium 11/28/2019 9.5  8.6 - 10.3 mg/dL Final  . Total Protein 11/28/2019 6.4  6.1 - 8.1 g/dL Final  . Albumin 11/28/2019 4.2  3.6 - 5.1 g/dL Final  . Globulin 11/28/2019 2.2  1.9 - 3.7 g/dL (calc) Final  . AG Ratio 11/28/2019 1.9  1.0 - 2.5 (calc) Final  . Total Bilirubin 11/28/2019 0.5  0.2 - 1.2 mg/dL Final  . Alkaline phosphatase (APISO) 11/28/2019 68  35 - 144 U/L Final  . AST 11/28/2019 18  10 - 35 U/L Final  . ALT 11/28/2019 15  9 - 46 U/L Final  . Cholesterol 11/28/2019 179  <200 mg/dL Final  . HDL 11/28/2019 60  > OR = 40 mg/dL Final  .  Triglycerides 11/28/2019 69  <150 mg/dL Final  . LDL Cholesterol (Calc) 11/28/2019 103* mg/dL (calc) Final   Comment: Reference range: <100 . Desirable range <100 mg/dL for primary prevention;   <70 mg/dL for patients with CHD or diabetic patients  with > or = 2 CHD risk factors. Marland Kitchen LDL-C is now calculated using the Martin-Hopkins  calculation, which is a validated novel method providing  better accuracy than the Friedewald equation in the  estimation of LDL-C.  Cresenciano Genre et al. Annamaria Helling. [id number]): 2061-2068  (http://education.[facility].com/faq/FAQ164)   . Total CHOL/HDL Ratio 11/28/2019 3.0  <5.0 (calc) Final  . Non-HDL Cholesterol (Calc) 11/28/2019 119  <130 mg/dL (calc) Final   Comment: For patients with diabetes plus 1 major ASCVD risk  factor, treating to a  non-HDL-C goal of <100 mg/dL  (LDL-C of <70 mg/dL) is considered a therapeutic  option.   . WBC 11/28/2019 5.4  3.8 - 10.8 Thousand/uL Final  . RBC 11/28/2019 4.40  4.20 - 5.80 Million/uL Final  . Hemoglobin 11/28/2019 13.5  13.2 - 17.1 g/dL Final  . HCT 11/28/2019 40.7  38.5 - 50.0 % Final  . MCV 11/28/2019 92.5  80.0 - 100.0 fL Final  . MCH 11/28/2019 30.7  27.0 - 33.0 pg Final  . MCHC 11/28/2019 33.2  32.0 - 36.0 g/dL Final  . RDW 11/28/2019 12.4  11.0 - 15.0 % Final  . Platelets 11/28/2019 240  140 - 400 Thousand/uL Final  . MPV 11/28/2019 10.7  7.5 - 12.5 fL Final  . Neutro Abs 11/28/2019 3,353  1,500 - 7,800 cells/uL Final  . Lymphs Abs 11/28/2019 1,280  850 - 3,900 cells/uL Final  . Absolute Monocytes 11/28/2019 583  200 - 950 cells/uL Final  . Eosinophils Absolute 11/28/2019 151  15 - 500 cells/uL Final  . Basophils Absolute 11/28/2019 32  0 - 200 cells/uL Final  . Neutrophils Relative % 11/28/2019 62.1  % Final  . Total Lymphocyte 11/28/2019 23.7  % Final  . Monocytes Relative 11/28/2019 10.8  % Final  . Eosinophils Relative 11/28/2019 2.8  % Final  . Basophils Relative 11/28/2019 0.6  % Final  . Hgb A1c MFr Bld 11/28/2019 7.2* <5.7 % of total Hgb Final   Comment: For someone without known diabetes, a hemoglobin A1c value of 6.5% or greater indicates that they may have  diabetes and this should be confirmed with a follow-up  test. . For someone with known diabetes, a value <7% indicates  that their diabetes is well controlled and a value  greater than or equal to 7% indicates suboptimal  control. A1c targets should be individualized based on  duration of diabetes, age, comorbid conditions, and  other considerations. . Currently, no consensus exists regarding use of hemoglobin A1c for diagnosis of diabetes for children. .   . Mean Plasma Glucose 11/28/2019 160  (calc) Final  . eAG (mmol/L) 11/28/2019 8.9  (calc) Final    Immunization History  Administered Date(s)  Administered  . Influenza,inj,Quad PF,6+ Mos 04/04/2018  . PFIZER SARS-COV-2 Vaccination 08/06/2019, 08/27/2019  . Pneumococcal Conjugate-13 11/16/2016  . Pneumococcal Polysaccharide-23 01/31/2015  . Tdap 09/25/2013, 08/09/2015  . Zoster 11/06/2013   Patient recently had a eye exam performed there was negative for any diabetic retinopathy Past Medical History:  Diagnosis Date  . Acute meniscal tear of left knee   . DDD (degenerative disc disease), lumbar    has received ESI x 3  . Diabetes mellitus without complication ([place])   . GERD (gastroesophageal reflux  disease)   . History of blood transfusion   . History of staph infection    after thumb surgery  . Hyperlipidemia   . Left rotator cuff tear   . Primary localized osteoarthritis of left knee    Past Surgical History:  Procedure Laterality Date  . CERVICAL FUSION    . COLONOSCOPY    . FOOT SURGERY Right   . JOINT REPLACEMENT N/A    Phreesia 12/02/2019  . KNEE ARTHROSCOPY Bilateral   . SHOULDER ARTHROSCOPY Left   . 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 TOTAL KNEE ARTHROPLASTY Right 02/26/2019   Procedure: TOTAL KNEE ARTHROPLASTY;  Surgeon: Elsie Saas, MD;  Location: [facility];  Service: Orthopedics;  Laterality: Right;     Current Outpatient Medications on File Prior to Visit  Medication Sig Dispense Refill  . aspirin EC 325 MG EC tablet 1 tab a day for the next 30 days to prevent blood clots 30 tablet 0  . atorvastatin (LIPITOR) 20 MG tablet TAKE 1 TABLET EVERY DAY (Patient taking differently: Take 20 mg by mouth daily. ) 90 tablet 3  . ketotifen (ALAWAY) 0.025 % ophthalmic solution Place 1 drop into both eyes daily as needed (itching).    . metFORMIN (GLUCOPHAGE) 500 MG tablet TAKE 1 TABLET BY MOUTH TWICE A DAY WITH MEALS 180 tablet 1  . Multiple Vitamin (MULTIVITAMIN WITH MINERALS) TABS tablet Take 1 tablet by mouth daily.    Vladimir Faster Glycol-Propyl Glycol (SYSTANE) 0.4-0.3 % GEL ophthalmic gel  Place 1 application into both eyes at bedtime.    Marland Kitchen Propylene Glycol (SYSTANE BALANCE) 0.6 % SOLN Place 1 drop into both eyes daily.     No current facility-administered medications on file prior to visit.   No Known Allergies Social History   Socioeconomic History  . Marital status: Married    Spouse name: Not on file  . Number of children: Not on file  . Years of education: Not on file  . Highest education level: Not on file  Occupational History  . Not on file  Tobacco Use  . Smoking status: Never Smoker  . Smokeless tobacco: Never Used  Substance and Sexual Activity  . Alcohol use: No  . Drug use: No  . 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 Not on file  Social History Narrative  . Not on file   Social Determinants of Health   Financial Resource Strain:   . Difficulty of Paying Living Expenses:   Food Insecurity:   . Worried About Charity fundraiser in the Last Year:   . Arboriculturist in the Last Year:   Transportation Needs:   . Film/video editor (Medical):   Marland Kitchen Lack of Transportation (Non-Medical):   Physical Activity:   . Days of Exercise per Week:   . Minutes of Exercise per Session:   Stress:   . Feeling of Stress :   Social Connections:   . Frequency of Communication with Friends and Family:   . Frequency of Social Gatherings with Friends and Family:   . Attends Religious Services:   . Active Member of Clubs or Organizations:   . Attends Archivist Meetings:   Marland Kitchen Marital Status:   Intimate Partner Violence:   . Fear of Current or Ex-Partner:   . Emotionally Abused:   Marland Kitchen Physically Abused:   .  Sexually Abused:    Family History  Problem Relation Age of Onset  . Heart disease Father   . Asthma Sister   . Arthritis Sister      Review of Systems  All other systems reviewed and are negative.      Objective:   Physical Exam  Constitutional: He is  oriented to person, place, and time. He appears well-developed and well-nourished. No distress.  HENT:  Head: Normocephalic and atraumatic.  Right Ear: External ear normal.  Left Ear: External ear normal.  Nose: Nose normal.  Mouth/Throat: Oropharynx is clear and moist. No oropharyngeal exudate.  Eyes: Pupils are equal, round, and reactive to light. Conjunctivae and EOM are normal. Right eye exhibits no discharge. Left eye exhibits no discharge. No scleral icterus.  Neck: No JVD present. No thyromegaly present.  Cardiovascular: Normal rate, regular rhythm, normal heart sounds and intact distal pulses. Exam reveals no gallop and no friction rub.  No murmur heard. Pulmonary/Chest: Effort normal and breath sounds normal. No respiratory distress. He has no wheezes. He has no rales. He exhibits no tenderness.  Abdominal: Soft. Bowel sounds are normal. He exhibits no distension and no mass. There is no abdominal tenderness. There is no rebound and no guarding.  Genitourinary:    Penis normal.   Musculoskeletal:        General: No tenderness or edema. Normal range of motion.     Cervical back: Normal range of motion and neck supple.  Lymphadenopathy:    He has no cervical adenopathy.  Neurological: He is alert and oriented to person, place, and time. He has normal reflexes. No cranial nerve deficit. He exhibits normal muscle tone. Coordination normal.  Skin: Skin is warm. No rash noted. He is not diaphoretic. No erythema. No pallor.  Psychiatric: He has a normal mood and affect. His behavior is normal. Judgment and thought content normal.  Vitals reviewed.         Assessment & Plan:  Controlled type 2 diabetes mellitus without complication, without long-term current use of insulin (HCC) - Plan: Microalbumin, urine  Pure hypercholesterolemia  Medicare annual wellness visit, subsequent  Amputation of left thumb, sequela ([place])  Diabetic foot exam was performed today and is normal.   Patient's immunizations are up-to-date although he is due for a Pneumovax 23 booster after July.  Colonoscopy is up-to-date.  PSA is excellent.  Hemoglobin A1c is elevated.  I will increase Metformin to 1000 mg twice a day.  LDL still elevated at 103.  Patient would like to work on diet and exercise rather than increase Lipitor at the present time.  Blood pressure is extremely high today.  Patient will start checking this at home and if consistently greater than 140/90, he would be willing to add an ACE inhibitor.  I will also check a urine microalbumin and if elevated I will add an ACE inhibitor.  Patient denies any complications from his amputated left thumb.  He denies any falls, depression, or memory loss.  Diabetic foot exam is normal today.  I will get his records from his most recent diabetic eye exam.

## 2019-12-05 LAB — MICROALBUMIN, URINE: Microalb, Ur: 0.3 mg/dL

## 2019-12-18 ENCOUNTER — Encounter: Payer: Self-pay | Admitting: Family Medicine

## 2019-12-24 ENCOUNTER — Other Ambulatory Visit: Payer: Self-pay

## 2019-12-24 MED ORDER — ATORVASTATIN CALCIUM 20 MG PO TABS: 20.0000 mg | ORAL_TABLET | Freq: Every day | ORAL | 3 refills | Status: DC

## 2020-03-31 DIAGNOSIS — H04123 Dry eye syndrome of bilateral lacrimal glands: Secondary | ICD-10-CM | POA: Diagnosis not present

## 2020-03-31 DIAGNOSIS — H25813 Combined forms of age-related cataract, bilateral: Secondary | ICD-10-CM | POA: Diagnosis not present

## 2020-03-31 DIAGNOSIS — D3131 Benign neoplasm of right choroid: Secondary | ICD-10-CM | POA: Diagnosis not present

## 2020-03-31 DIAGNOSIS — E119 Type 2 diabetes mellitus without complications: Secondary | ICD-10-CM | POA: Diagnosis not present

## 2020-03-31 DIAGNOSIS — D3132 Benign neoplasm of left choroid: Secondary | ICD-10-CM | POA: Diagnosis not present

## 2020-03-31 DIAGNOSIS — H10413 Chronic giant papillary conjunctivitis, bilateral: Secondary | ICD-10-CM | POA: Diagnosis not present

## 2020-03-31 DIAGNOSIS — H35413 Lattice degeneration of retina, bilateral: Secondary | ICD-10-CM | POA: Diagnosis not present

## 2020-03-31 DIAGNOSIS — H11043 Peripheral pterygium, stationary, bilateral: Secondary | ICD-10-CM | POA: Diagnosis not present

## 2020-03-31 DIAGNOSIS — D3130 Benign neoplasm of unspecified choroid: Secondary | ICD-10-CM | POA: Diagnosis not present

## 2020-03-31 DIAGNOSIS — H0220C Unspecified lagophthalmos, bilateral, upper and lower eyelids: Secondary | ICD-10-CM | POA: Diagnosis not present

## 2020-04-04 ENCOUNTER — Telehealth: Payer: Self-pay

## 2020-04-04 ENCOUNTER — Other Ambulatory Visit: Payer: Self-pay

## 2020-04-04 NOTE — Telephone Encounter (Signed)
Pt would like new rx refill of omeprazole it is out of date. Ok to send?

## 2020-04-07 ENCOUNTER — Other Ambulatory Visit: Payer: Self-pay | Admitting: Family Medicine

## 2020-04-07 MED ORDER — OMEPRAZOLE 40 MG PO CPDR: 40.0000 mg | DELAYED_RELEASE_CAPSULE | Freq: Every day | ORAL | 11 refills | Status: DC

## 2020-04-07 NOTE — Telephone Encounter (Signed)
I sent in omeprazole.

## 2020-04-07 NOTE — Telephone Encounter (Signed)
New rx sent for Pt

## 2020-05-21 DIAGNOSIS — R69 Illness, unspecified: Secondary | ICD-10-CM | POA: Diagnosis not present

## 2020-06-14 ENCOUNTER — Other Ambulatory Visit: Payer: Medicare HMO

## 2020-06-14 DIAGNOSIS — Z20822 Contact with and (suspected) exposure to covid-19: Secondary | ICD-10-CM

## 2020-06-16 ENCOUNTER — Telehealth: Payer: Self-pay | Admitting: *Deleted

## 2020-06-16 LAB — NOVEL CORONAVIRUS, NAA: SARS-CoV-2, NAA: DETECTED — AB

## 2020-06-16 LAB — SARS-COV-2, NAA 2 DAY TAT

## 2020-06-16 NOTE — Telephone Encounter (Signed)
Patient verifying his Covid test is positive after receiving a call that it was positive for Covid but received text stating his results were negative. Reviewed positive results with the patient and advised his wife needs to isolate along with him for the 10 days since they tested. Keep separate if possible, wear mask if in the same room, monitor your breathing should you be concerned seek evaluation immediately. Patient asking if he and his wife should be retested since she was the one who was exposed last week advised it's not necessary and is likely she may be positive at this time. Health department notified by other.

## 2020-06-17 ENCOUNTER — Telehealth: Payer: Self-pay | Admitting: Physician Assistant

## 2020-06-17 NOTE — Telephone Encounter (Signed)
Called to discuss with Leonie Green about Covid symptoms and the use of  monoclonal antibody infusion for those with mild to moderate Covid symptoms and at a high risk of hospitalization.     Pt does not qualify for infusion therapy as his symptoms first presented > 10 days prior to timing of infusion. Symptoms tier reviewed as well as criteria for ending isolation. Preventative practices reviewed. Patient verbalized understanding  He is feeling "excellent just very mild symptoms".   Patient Active Problem List   Diagnosis Date Noted  . Primary localized osteoarthritis of right knee   . Diabetes mellitus without complication ([place])   . DDD (degenerative disc disease), lumbar   . Hyperlipidemia 10/28/2016  . 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 Prediabetes    Leanor Kail, PA-C

## 2020-08-18 ENCOUNTER — Telehealth: Payer: Self-pay | Admitting: Family Medicine

## 2020-08-18 NOTE — Telephone Encounter (Signed)
Call placed to patient.   Reports that he has x2 weeks of cough with nasal congestion/ drainage. Reports that cough is slightly productive with yellow mucus.   States that he had COVID in November 2021.  Appointment scheduled for evaluation.

## 2020-08-18 NOTE — Telephone Encounter (Signed)
Pt recovering from [place] still having coughing symptoms, and running nose, wanting to know if he should be seen again.

## 2020-08-19 ENCOUNTER — Other Ambulatory Visit: Payer: Self-pay

## 2020-08-19 ENCOUNTER — Ambulatory Visit (INDEPENDENT_AMBULATORY_CARE_PROVIDER_SITE_OTHER): Payer: Medicare HMO | Admitting: Family Medicine

## 2020-08-19 VITALS — BP 136/70 | HR 87 | Temp 98.7°F | Resp 15 | Ht 70.0 in | Wt 166.0 lb

## 2020-08-19 DIAGNOSIS — J40 Bronchitis, not specified as acute or chronic: Secondary | ICD-10-CM | POA: Diagnosis not present

## 2020-08-19 MED ORDER — DOXYCYCLINE HYCLATE 100 MG PO TABS: 100.0000 mg | ORAL_TABLET | Freq: Two times a day (BID) | ORAL | 0 refills | Status: DC

## 2020-08-19 MED ORDER — ALBUTEROL SULFATE HFA 108 (90 BASE) MCG/ACT IN AERS: 2.0000 | INHALATION_SPRAY | Freq: Four times a day (QID) | RESPIRATORY_TRACT | 2 refills | Status: DC | PRN

## 2020-08-19 MED ORDER — PREDNISONE 20 MG PO TABS: ORAL_TABLET | ORAL | 0 refills | Status: DC

## 2020-08-19 NOTE — Progress Notes (Signed)
Subjective:    Patient ID: Todd Lawrence, male    DOB: 11/15/1950, 70 y.o.   MRN: [id number]  HPI  Patient is a very pleasant 70 year old gentleman with a history of diabetes mellitus. For the last 2 weeks he has been sick with an upper respiratory infection. Started with runny nose and sinus congestion and is turned into chest congestion. He now reports a cough productive of yellow sputum. He reports some mild shortness of breath. He reports some wheezing. On examination today, the patient has diffuse expiratory wheezing along with left basilar crackles. He had COVID in November. No one around him is been sick recently. He denies any fevers or chills but he does report fatigue and generally feeling weak. Past Medical History:  Diagnosis Date  . Acute meniscal tear of left knee   . DDD (degenerative disc disease), lumbar    has received ESI x 3  . Diabetes mellitus without complication ([place])   . GERD (gastroesophageal reflux disease)   . History of blood transfusion   . History of staph infection    after thumb surgery  . Hyperlipidemia   . Left rotator cuff tear   . Primary localized osteoarthritis of left knee    Past Surgical History:  Procedure Laterality Date  . CERVICAL FUSION    . COLONOSCOPY    . FOOT SURGERY Right   . JOINT REPLACEMENT N/A    Phreesia 12/02/2019  . KNEE ARTHROSCOPY Bilateral   . SHOULDER ARTHROSCOPY Left   . 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 TOTAL KNEE ARTHROPLASTY Right 02/26/2019   Procedure: TOTAL KNEE ARTHROPLASTY;  Surgeon: Elsie Saas, MD;  Location: [facility];  Service: Orthopedics;  Laterality: Right;     Current Outpatient Medications on File Prior to Visit  Medication Sig Dispense Refill  . aspirin EC 325 MG EC tablet 1 tab a day for the next 30 days to prevent blood clots 30 tablet 0  . atorvastatin (LIPITOR) 20 MG tablet Take 1 tablet (20 mg total) by mouth daily. 90 tablet 3  . ketotifen (ZADITOR) 0.025 % ophthalmic  solution Place 1 drop into both eyes daily as needed (itching).    . metFORMIN (GLUCOPHAGE) 1000 MG tablet Take 1 tablet (1,000 mg total) by mouth 2 (two) times daily with a meal. 180 tablet 3  . metFORMIN (GLUCOPHAGE) 500 MG tablet TAKE 1 TABLET BY MOUTH TWICE A DAY WITH MEALS 180 tablet 1  . Multiple Vitamin (MULTIVITAMIN WITH MINERALS) TABS tablet Take 1 tablet by mouth daily.    Marland Kitchen omeprazole (PRILOSEC) 40 MG capsule Take 1 capsule (40 mg total) by mouth daily. 30 capsule 11  . Polyethyl Glycol-Propyl Glycol (SYSTANE) 0.4-0.3 % GEL ophthalmic gel Place 1 application into both eyes at bedtime.    Marland Kitchen Propylene Glycol (SYSTANE BALANCE) 0.6 % SOLN Place 1 drop into both eyes daily.     No current facility-administered medications on file prior to visit.   No Known Allergies Social History   Socioeconomic History  . Marital status: Married    Spouse name: Not on file  . Number of children: Not on file  . Years of education: Not on file  . Highest education level: Not on file  Occupational History  . Not on file  Tobacco Use  . Smoking status: Never Smoker  . Smokeless tobacco: Never Used  Vaping Use  . Vaping Use: Never used  Substance and Sexual Activity  . Alcohol  use: No  . Drug use: No  . 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 Not on file  Social History Narrative  . Not on file   Social Determinants of Health   Financial Resource Strain: Not on file  Food Insecurity: Not on file  Transportation Needs: Not on file  Physical Activity: Not on file  Stress: Not on file  Social Connections: Not on file  Intimate Partner Violence: Not on file   Family History  Problem Relation Age of Onset  . Heart disease Father   . Asthma Sister   . Arthritis Sister      Review of Systems  Respiratory: Positive for cough.   All other systems reviewed and are negative.      Objective:   Physical  Exam Vitals reviewed.  Constitutional:      General: He is not in acute distress.    Appearance: He is well-developed. He is not diaphoretic.  HENT:     Head: Normocephalic and atraumatic.     Right Ear: External ear normal.     Left Ear: External ear normal.     Nose: Nose normal.     Mouth/Throat:     Pharynx: No oropharyngeal exudate.  Eyes:     General: No scleral icterus.       Right eye: No discharge.        Left eye: No discharge.     Conjunctiva/sclera: Conjunctivae normal.     Pupils: Pupils are equal, round, and reactive to light.  Neck:     Thyroid: No thyromegaly.     Vascular: No JVD.  Cardiovascular:     Rate and Rhythm: Normal rate and regular rhythm.     Heart sounds: Normal heart sounds. No murmur heard. No friction rub. No gallop.   Pulmonary:     Effort: Pulmonary effort is normal. No respiratory distress.     Breath sounds: Wheezing and rales present.  Chest:     Chest wall: No tenderness.  Abdominal:     General: Bowel sounds are normal. There is no distension.     Palpations: Abdomen is soft. There is no mass.     Tenderness: There is no abdominal tenderness. There is no guarding or rebound.  Genitourinary:    Penis: Normal.   Musculoskeletal:        General: No tenderness. Normal range of motion.     Cervical back: Normal range of motion and neck supple.  Lymphadenopathy:     Cervical: No cervical adenopathy.  Skin:    General: Skin is warm.     Coloration: Skin is not pale.     Findings: No erythema or rash.  Neurological:     Mental Status: He is alert and oriented to person, place, and time.     Cranial Nerves: No cranial nerve deficit.     Motor: No abnormal muscle tone.     Coordination: Coordination normal.     Deep Tendon Reflexes: Reflexes are normal and symmetric.  Psychiatric:        Behavior: Behavior normal.        Thought Content: Thought content normal.        Judgment: Judgment normal.           Assessment & Plan:   Bronchitis - Plan: predniSONE (DELTASONE) 20 MG tablet, albuterol (VENTOLIN HFA) 108 (90 Base) MCG/ACT inhaler, doxycycline (VIBRA-TABS) 100  MG tablet, SARS-COV-2 RNA,(COVID-19) QUAL NAAT  Patient is definitely wheezing all throughout. Begin prednisone taper pack for reactive airway disease and start albuterol 2 puffs inhaled every 4-6 hours for wheezing. Begin doxycycline given the crackles on appreciating the left basilar area for possible community-acquired pneumonia. Check the patient for Covid in case he has omicron variant. Reassess in 1 week or sooner if worsening

## 2020-08-21 ENCOUNTER — Other Ambulatory Visit: Payer: Medicare HMO

## 2020-08-21 DIAGNOSIS — Z13220 Encounter for screening for lipoid disorders: Secondary | ICD-10-CM | POA: Diagnosis not present

## 2020-08-21 DIAGNOSIS — E119 Type 2 diabetes mellitus without complications: Secondary | ICD-10-CM | POA: Diagnosis not present

## 2020-08-21 DIAGNOSIS — Z136 Encounter for screening for cardiovascular disorders: Secondary | ICD-10-CM | POA: Diagnosis not present

## 2020-08-21 DIAGNOSIS — E7800 Pure hypercholesterolemia, unspecified: Secondary | ICD-10-CM | POA: Diagnosis not present

## 2020-08-21 LAB — SARS-COV-2 RNA,(COVID-19) QUALITATIVE NAAT: SARS CoV2 RNA: NOT DETECTED

## 2020-08-22 LAB — COMPLETE METABOLIC PANEL WITH GFR
AG Ratio: 1.6 (calc) (ref 1.0–2.5)
ALT: 10 U/L (ref 9–46)
AST: 14 U/L (ref 10–35)
Albumin: 4.3 g/dL (ref 3.6–5.1)
Alkaline phosphatase (APISO): 77 U/L (ref 35–144)
BUN: 18 mg/dL (ref 7–25)
CO2: 30 mmol/L (ref 20–32)
Calcium: 10 mg/dL (ref 8.6–10.3)
Chloride: 96 mmol/L — ABNORMAL LOW (ref 98–110)
Creat: 0.74 mg/dL (ref 0.70–1.18)
GFR, Est African American: 108 mL/min/{1.73_m2} (ref >=60)
GFR, Est Non African American: 93 mL/min/{1.73_m2} (ref >=60)
Globulin: 2.7 g/dL (calc) (ref 1.9–3.7)
Glucose, Bld: 132 mg/dL — ABNORMAL HIGH (ref 65–99)
Potassium: 5.3 mmol/L (ref 3.5–5.3)
Sodium: 136 mmol/L (ref 135–146)
Total Bilirubin: 0.3 mg/dL (ref 0.2–1.2)
Total Protein: 7 g/dL (ref 6.1–8.1)

## 2020-08-22 LAB — LIPID PANEL
Cholesterol: 151 mg/dL (ref <200)
HDL: 50 mg/dL (ref >=40)
LDL Cholesterol (Calc): 87 mg/dL (calc)
Non-HDL Cholesterol (Calc): 101 mg/dL (calc) (ref <130)
Total CHOL/HDL Ratio: 3 (calc) (ref <5.0)
Triglycerides: 58 mg/dL (ref <150)

## 2020-08-22 LAB — CBC WITH DIFFERENTIAL/PLATELET
Absolute Monocytes: 854 cells/uL (ref 200–950)
Basophils Absolute: 12 cells/uL (ref 0–200)
Basophils Relative: 0.1 %
Eosinophils Absolute: 0 cells/uL — ABNORMAL LOW (ref 15–500)
Eosinophils Relative: 0 %
HCT: 36.3 % — ABNORMAL LOW (ref 38.5–50.0)
Hemoglobin: 12.2 g/dL — ABNORMAL LOW (ref 13.2–17.1)
Lymphs Abs: 1452 cells/uL (ref 850–3900)
MCH: 30.6 pg (ref 27.0–33.0)
MCHC: 33.6 g/dL (ref 32.0–36.0)
MCV: 91 fL (ref 80.0–100.0)
MPV: 10.2 fL (ref 7.5–12.5)
Monocytes Relative: 7 %
Neutro Abs: 9882 cells/uL — ABNORMAL HIGH (ref 1500–7800)
Neutrophils Relative %: 81 %
Platelets: 392 10*3/uL (ref 140–400)
RBC: 3.99 10*6/uL — ABNORMAL LOW (ref 4.20–5.80)
RDW: 12.4 % (ref 11.0–15.0)
Total Lymphocyte: 11.9 %
WBC: 12.2 10*3/uL — ABNORMAL HIGH (ref 3.8–10.8)

## 2020-08-22 LAB — HEMOGLOBIN A1C
Hgb A1c MFr Bld: 7.3 % of total Hgb — ABNORMAL HIGH (ref <5.7)
Mean Plasma Glucose: 163 mg/dL
eAG (mmol/L): 9 mmol/L

## 2020-09-02 ENCOUNTER — Other Ambulatory Visit: Payer: Self-pay

## 2020-09-02 DIAGNOSIS — E119 Type 2 diabetes mellitus without complications: Secondary | ICD-10-CM

## 2020-09-02 MED ORDER — EMPAGLIFLOZIN 25 MG PO TABS: 25.0000 mg | ORAL_TABLET | Freq: Every day | ORAL | 3 refills | Status: DC

## 2020-10-08 ENCOUNTER — Telehealth: Payer: Self-pay | Admitting: *Deleted

## 2020-10-08 NOTE — Telephone Encounter (Signed)
Received call from patient.   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 States that he requires letter from PCP in regards to DM/ White Coat Syndrome.   Letter transcribed and placed on desk for signature.

## 2020-10-24 ENCOUNTER — Telehealth: Payer: Self-pay | Admitting: Family Medicine

## 2020-10-24 NOTE — Progress Notes (Signed)
  Chronic Care Management   Note  10/24/2020 Name: PRAVEEN COIA MRN: [id number] DOB: 1951/04/22  NUMAIR MASDEN is a 70 y.o. year old male who is a primary care patient of [name], MD. I reached out to Leonie Green by phone today in response to a referral sent by Mr. Jevaun Strick [place]'s PCP, [name], MD.   Mr. Hellard was given information about Chronic Care Management services today including:  1. CCM service includes personalized support from designated clinical staff supervised by his physician, including individualized plan of care and coordination with other care providers 2. 24/7 contact phone numbers for assistance for urgent and routine care needs. 3. Service will only be billed when office clinical staff spend 20 minutes or more in a month to coordinate care. 4. Only one practitioner may furnish and bill the service in a calendar month. 5. The patient may stop CCM services at any time (effective at the end of the month) by phone call to the office staff.   Patient agreed to services and verbal consent obtained.   Follow up plan:   Carley Perdue [employer] Scheduler

## 2020-11-18 DIAGNOSIS — M542 Cervicalgia: Secondary | ICD-10-CM | POA: Diagnosis not present

## 2020-11-18 DIAGNOSIS — M19012 Primary osteoarthritis, left shoulder: Secondary | ICD-10-CM | POA: Diagnosis not present

## 2020-11-18 DIAGNOSIS — M1712 Unilateral primary osteoarthritis, left knee: Secondary | ICD-10-CM | POA: Diagnosis not present

## 2020-11-26 ENCOUNTER — Telehealth: Payer: Self-pay | Admitting: Pharmacist

## 2020-11-26 NOTE — Progress Notes (Addendum)
Chronic Care Management Pharmacy Assistant   Name: KERBY BORNER  MRN: [id number] DOB: 06-10-1951  Todd Lawrence is an 70 y.o. year old male who presents for his initial CCM visit with the clinical pharmacist.  Reason for Encounter: Chart Prep   Conditions to be addressed/monitored: DM, DDD, HLD, Osteoarthritis  Primary concerns for visit include: DM   Recent office visits:  08/19/20 Dr. Dennard Schaumann.For Bronchitis. STARTED Albuterol Sulfate 108 (90 Base) MCG/ACT 2 puffs inhalation every 6 hours PRN, Doxycycline Hyclate 100 mg 2 times daily, and Prednisone 20 mg pack.  Recent consult visits:  None in the last 6 months.  Hospital visits:  None in previous 6 months   Medication History: Jardiance 25 mg 30 DS 10/31/20 Atorvastatin 20 mg 90 DS 09/21/20 Metformin 500 mg 90 DS 06/04/20  Medications: Outpatient Encounter Medications as of 11/26/2020  Medication Sig   albuterol (VENTOLIN HFA) 108 (90 Base) MCG/ACT inhaler Inhale 2 puffs into the lungs every 6 (six) hours as needed for wheezing or shortness of breath.   aspirin EC 325 MG EC tablet 1 tab a day for the next 30 days to prevent blood clots   atorvastatin (LIPITOR) 20 MG tablet Take 1 tablet (20 mg total) by mouth daily.   doxycycline (VIBRA-TABS) 100 MG tablet Take 1 tablet (100 mg total) by mouth 2 (two) times daily.   empagliflozin (JARDIANCE) 25 MG TABS tablet Take 1 tablet (25 mg total) by mouth daily before breakfast.   ketotifen (ZADITOR) 0.025 % ophthalmic solution Place 1 drop into both eyes daily as needed (itching).   metFORMIN (GLUCOPHAGE) 1000 MG tablet Take 1 tablet (1,000 mg total) by mouth 2 (two) times daily with a meal.   metFORMIN (GLUCOPHAGE) 500 MG tablet TAKE 1 TABLET BY MOUTH TWICE A DAY WITH MEALS   Multiple Vitamin (MULTIVITAMIN WITH MINERALS) TABS tablet Take 1 tablet by mouth daily.   omeprazole (PRILOSEC) 40 MG capsule Take 1 capsule (40 mg total) by mouth daily.   Polyethyl Glycol-Propyl Glycol  (SYSTANE) 0.4-0.3 % GEL ophthalmic gel Place 1 application into both eyes at bedtime.   predniSONE (DELTASONE) 20 MG tablet 3 tabs poqday 1-2, 2 tabs poqday 3-4, 1 tab poqday 5-6   Propylene Glycol (SYSTANE BALANCE) 0.6 % SOLN Place 1 drop into both eyes daily.   No facility-administered encounter medications on file as of 11/26/2020.    Have you seen any other providers since your last visit? Patient stated no.  Any changes in your medications or health? Patient stated no.  Any side effects from any medications? Patient stated no.  Do you have an symptoms or problems not managed by your medications? Patient stated no.  Any concerns about your health right now? Patient stated no.  Has your provider asked that you check blood pressure, blood sugar, or follow special diet at home? Patient stated he checks his blood pressure sometimes and tries to watch  Do you get any type of exercise on a regular basis? Patient stated he works  Can you think of a goal you would like to reach for your health? Patient stated no.  Do you have any problems getting your medications? Patient stated Vania Rea is a little expensive.   Is there anything that you would like to discuss during the appointment? Patient stated no.  Please bring medications and supplements to appointment, patient reminded of face to face appointment on 5/23 at 2 pm.   Follow-Up:Pharmacist Review   Charlann Lange, RMA Clinical  Pharmacist Assistant [phone]

## 2020-11-26 NOTE — Progress Notes (Signed)
Chronic Care Management Pharmacy Note  12/01/2020 Name:  ALDRED MASE MRN:  [id number] DOB:  04/30/1951  Subjective: Todd Lawrence is an 70 y.o. year old male who is a primary patient of Pickard, Cammie Mcgee, MD.  The CCM team was consulted for assistance with disease management and care coordination needs.    Engaged with patient by telephone for follow up visit in response to provider referral for pharmacy case management and/or care coordination services.   Consent to Services:  The patient was given the following information about Chronic Care Management services today, agreed to services, and gave verbal consent: 1. CCM service includes personalized support from designated clinical staff supervised by the primary care provider, including individualized plan of care and coordination with other care providers 2. 24/7 contact phone numbers for assistance for urgent and routine care needs. 3. Service will only be billed when office clinical staff spend 20 minutes or more in a month to coordinate care. 4. Only one practitioner may furnish and bill the service in a calendar month. 5.The patient may stop CCM services at any time (effective at the end of the month) by phone call to the office staff. 6. The patient will be responsible for cost sharing (co-pay) of up to 20% of the service fee (after annual deductible is met). Patient agreed to services and consent obtained.  Patient Care Team: [name], MD as PCP - General (Family Medicine) Edythe Clarity, [facility] as Pharmacist (Pharmacist)  Recent office visits: 08/19/20 Dr. Dennard Schaumann.For Bronchitis. STARTED Albuterol Sulfate 108 (90 Base) MCG/ACT 2 puffs inhalation every 6 hours PRN, Doxycycline Hyclate 100 mg 2 times daily, and Prednisone 20 mg pack  Recent consult visits: None in previous 6 months  Hospital visits: None in previous 6 months  Medication History: Jardiance 25 mg 30 DS 10/31/20 Atorvastatin 20 mg 90 DS 09/21/20 Metformin  500 mg 90 DS 06/04/20  Objective:  Lab Results  Component Value Date   CREATININE 0.74 08/21/2020   BUN 18 08/21/2020   GFRNONAA 93 08/21/2020   GFRAA 108 08/21/2020   NA 136 08/21/2020   K 5.3 08/21/2020   CALCIUM 10.0 08/21/2020   CO2 30 08/21/2020   GLUCOSE 132 (H) 08/21/2020    Lab Results  Component Value Date/Time   HGBA1C 7.3 (H) 08/21/2020 08:33 AM   HGBA1C 7.2 (H) 11/28/2019 09:45 AM   MICROALBUR 0.3 12/04/2019 09:04 AM    Last diabetic Eye exam:  Lab Results  Component Value Date/Time   HMDIABEYEEXA No Retinopathy 03/28/2019 12:00 AM    Last diabetic Foot exam: No results found for: HMDIABFOOTEX   Lab Results  Component Value Date   CHOL 151 08/21/2020   HDL 50 08/21/2020   LDLCALC 87 08/21/2020   TRIG 58 08/21/2020   CHOLHDL 3.0 08/21/2020    Hepatic Function Latest Ref Rng & Units 08/21/2020 11/28/2019 02/16/2019  Total Protein 6.1 - 8.1 g/dL 7.0 6.4 7.7  Albumin 3.5 - 5.0 g/dL - - 4.4  AST 10 - 35 U/L '14 18 24  ' ALT 9 - 46 U/L '10 15 19  ' Alk Phosphatase 38 - 126 U/L - - 62  Total Bilirubin 0.2 - 1.2 mg/dL 0.3 0.5 0.8    Lab Results  Component Value Date/Time   TSH 1.47 11/02/2016 08:50 AM   TSH 1.480 01/14/2015 08:19 AM    CBC Latest Ref Rng & Units 08/21/2020 11/28/2019 02/27/2019  WBC 3.8 - 10.8 Thousand/uL 12.2(H) 5.4 15.2(H)  Hemoglobin 13.2 -  17.1 g/dL 12.2(L) 13.5 11.0(L)  Hematocrit 38.5 - 50.0 % 36.3(L) 40.7 34.2(L)  Platelets 140 - 400 Thousand/uL 392 240 224    Lab Results  Component Value Date/Time   VD25OH 42 09/21/2013 10:52 AM    Clinical ASCVD: No  The 10-year ASCVD risk score Mikey Bussing DC Jr., et al., 2013) is: 33%   Values used to calculate the score:     Age: 22 years     Sex: Male     Is Non-Hispanic African American: No     Diabetic: Yes     Tobacco smoker: No     Systolic Blood Pressure: 010 mmHg     Is BP treated: No     HDL Cholesterol: 50 mg/dL     Total Cholesterol: 151 mg/dL    Depression screen [facility] 2/9 12/04/2019  04/04/2018 04/04/2018  Decreased Interest 0 0 0  Down, Depressed, Hopeless 0 0 0  PHQ - 2 Score 0 0 0  Altered sleeping - - -  Tired, decreased energy - - -  Change in appetite - - -  Feeling bad or failure about yourself  - - -  Trouble concentrating - - -  Moving slowly or fidgety/restless - - -  PHQ-9 Score - - -  Difficult doing work/chores - - -     Social History   Tobacco Use  Smoking Status Never Smoker  Smokeless Tobacco Never Used   BP Readings from Last 3 Encounters:  08/19/20 136/70  12/04/19 (!) 162/88  02/27/19 (!) 141/77   Pulse Readings from Last 3 Encounters:  08/19/20 87  12/04/19 72  02/27/19 79   Wt Readings from Last 3 Encounters:  08/19/20 166 lb (75.3 kg)  12/04/19 171 lb (77.6 kg)  02/26/19 165 lb 6.4 oz (75 kg)   BMI Readings from Last 3 Encounters:  08/19/20 23.82 kg/m  12/04/19 24.54 kg/m  02/26/19 23.73 kg/m    Assessment/Interventions: Review of patient past medical history, allergies, medications, health status, including review of consultants reports, laboratory and other test data, was performed as part of comprehensive evaluation and provision of chronic care management services.   SDOH:  (Social Determinants of Health) assessments and interventions performed: Yes   Financial Resource Strain: Low Risk   . Difficulty of Paying Living Expenses: Not hard at all    SDOH Screenings   Alcohol Screen: Not on file  Depression (PHQ2-9): Low Risk   . PHQ-2 Score: 0  Financial Resource Strain: Low Risk   . Difficulty of Paying Living Expenses: Not hard at all  Food Insecurity: Not on file  Housing: Not on file  Physical Activity: Not on file  Social Connections: Not on file  Stress: Not on file  Tobacco Use: Low Risk   . Smoking Tobacco Use: Never Smoker  . Smokeless Tobacco Use: Never Used  Transportation Needs: Not on file    [place]  No Known Allergies  Medications Reviewed Today    Reviewed by Edythe Clarity,  [facility] (Pharmacist) on 12/01/20 at 1526  Med List Status: <None>  Medication Order Taking? Sig Documenting Provider Last Dose Status Informant  albuterol (VENTOLIN HFA) 108 (90 Base) MCG/ACT inhaler [id number] Yes Inhale 2 puffs into the lungs every 6 (six) hours as needed for wheezing or shortness of breath. [name], MD Taking Active   aspirin EC 325 MG EC tablet [id number] Yes 1 tab a day for the next 30 days to prevent blood clots Shepperson, Kirstin, PA-C  Taking Active   atorvastatin (LIPITOR) 20 MG tablet [id number] Yes Take 1 tablet (20 mg total) by mouth daily. [name], MD Taking Active   doxycycline (VIBRA-TABS) 100 MG tablet [id number] Yes Take 1 tablet (100 mg total) by mouth 2 (two) times daily. [name], MD Taking Active   empagliflozin (JARDIANCE) 25 MG TABS tablet [id number] Yes Take 1 tablet (25 mg total) by mouth daily before breakfast. [name], MD Taking Active   ketotifen (ZADITOR) 0.025 % ophthalmic solution [id number] Yes Place 1 drop into both eyes daily as needed (itching). [provider] Taking Active Self  metFORMIN (GLUCOPHAGE) 1000 MG tablet [id number] No Take 1 tablet (1,000 mg total) by mouth 2 (two) times daily with a meal.  Patient not taking: Reported on 12/01/2020   [name], MD Not Taking Active   Multiple Vitamin (MULTIVITAMIN WITH MINERALS) TABS tablet [id number] Yes Take 1 tablet by mouth daily. [provider] Taking Active Self  omeprazole (PRILOSEC) 40 MG capsule [id number] Yes Take 1 capsule (40 mg total) by mouth daily. [name], MD Taking Active   Polyethyl Glycol-Propyl Glycol (SYSTANE) 0.4-0.3 % GEL ophthalmic gel [id number] Yes Place 1 application into both eyes at bedtime. [provider] Taking Active Self  predniSONE (DELTASONE) 20 MG tablet [id number] Yes 3 tabs poqday 1-2, 2 tabs poqday 3-4, 1 tab poqday 5-6 [name], MD Taking Active   Propylene Glycol (SYSTANE BALANCE) 0.6  % SOLN [id number] Yes Place 1 drop into both eyes daily. [provider] Taking Active Self          Patient Active Problem List   Diagnosis Date Noted  . Primary localized osteoarthritis of right knee   . Diabetes mellitus without complication ([place])   . DDD (degenerative disc disease), lumbar   . Hyperlipidemia 10/28/2016  . Chronic osteomyelitis of left hand ([place]) 09/28/2015  . 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 Prediabetes     Immunization History  Administered Date(s) Administered  . Influenza,inj,Quad PF,6+ Mos 04/04/2018  . PFIZER(Purple Top)SARS-COV-2 Vaccination 08/06/2019, 08/27/2019  . Pneumococcal Conjugate-13 11/16/2016  . Pneumococcal Polysaccharide-23 01/31/2015  . Tdap 09/25/2013, 08/09/2015  . Zoster 11/06/2013    Conditions to be addressed/monitored:  DM, Osteoarthritis, DDD, HLD  Care Plan : General Pharmacy (Adult)  Updates made by Edythe Clarity, RPH since 12/01/2020 12:00 AM    Problem: DM, Osteoarthritis, DDD, HLD   Priority: High  Onset Date: 12/01/2020    Long-Range Goal: Patient-Specific Goal   Start Date: 12/01/2020  Expected End Date: 06/03/2021  This Visit's Progress: On track  Priority: High  Note:   Current Barriers:  . Unable to achieve control of blood glucose   Pharmacist Clinical Goal(s):  Marland Kitchen Patient will achieve control of glucose as evidenced by A1c . adhere to plan to optimize therapeutic regimen for DM as evidenced by report of adherence to recommended medication management changes . adhere to prescribed medication regimen as evidenced by fill dates . contact provider office for questions/concerns as evidenced notation of same in electronic health record through collaboration with PharmD and provider.   Interventions: . 1:1 collaboration with [name], MD regarding development and update of comprehensive plan of care as evidenced by provider attestation and  co-signature . Inter-disciplinary care team collaboration (see longitudinal plan of care) . Comprehensive medication review performed; medication list updated in electronic medical record  Hyperlipidemia: (LDL goal < 70) -  Not ideally controlled -Current treatment: . Atorvastatin 31m daily -Medications previously tried: none noted  -Current dietary patterns: working on cutting back on sugars, denies any regular sodas, does admit to occasional ice cream or milkshake -Current exercise habits: very active mowing yards etc. -Educated on Cholesterol goals;  Benefits of statin for ASCVD risk reduction; Importance of limiting foods high in cholesterol; -Recommended to continue current medication  Diabetes (A1c goal <7%) -Not ideally controlled -Current medications: . Jardiance 223mdaily -Medications previously tried: Metformin (d/c)  -Current home glucose readings . fasting glucose: not checking . post prandial glucose: not checking -Denies hypoglycemic/hyperglycemic symptoms -Current meal patterns:  . breakfast: cheerios  . lunch: sandwich  . dinner: home cooked meal . snacks: almonds, cheez-itz . drinks: diet cheerwine, propel packets, water -Current exercise: see above -Educated on A1c and blood sugar goals; Prevention and management of hypoglycemic episodes; Carbohydrate counting and/or plate method -Counseled to check feet daily and get yearly eye exams -Recommended to continue current medication Recommended continuing to cut back on carbohydrates and limit to one source per meal.  Recommend repeat lipid panel. If A1c still elevated at that time would recommend adding back Metformin 50040maily to Jardiance.  Assessed patient finances, I believe he would qualify for PAP with Jardiance.  Application provided, patient will fill out their part and return for completion.  Osteoarthritis/DDD (Goal: Minimize symptoms) -Controlled -Current treatment  . IBU prn - not taking very  often -Medications previously tried: none noted  -Recommended to continue current medication   Patient Goals/Self-Care Activities . Patient will:  - take medications as prescribed focus on medication adherence by using pill box collaborate with provider on medication access solutions  Follow Up Plan: The care management team will reach out to the patient again over the next 120 days.         Medication Assistance: Application for Jardiance  medication assistance program. in process.  Anticipated assistance start date unknown.  See plan of care for additional detail.  [facility]  [id number] [place]: [phone]: [phone] pill box? Yes - wife organizes Pt endorses 100% compliance  We discussed: Benefits of medication synchronization, packaging and delivery as well as enhanced pharmacist oversight with [employer]. Patient decided to: Continue current medication management strategy  Care Plan and Follow Up Patient Decision:  Patient agrees to Care Plan and Follow-up.  Plan: The care management team will reach out to the patient again over the next 120 days.  ChriBeverly MilcharmD Clinical Pharmacist [phone]

## 2020-12-01 ENCOUNTER — Other Ambulatory Visit: Payer: Self-pay

## 2020-12-01 ENCOUNTER — Ambulatory Visit (INDEPENDENT_AMBULATORY_CARE_PROVIDER_SITE_OTHER): Payer: Medicare HMO | Admitting: Pharmacist

## 2020-12-01 ENCOUNTER — Telehealth: Payer: Self-pay | Admitting: Family Medicine

## 2020-12-01 DIAGNOSIS — E119 Type 2 diabetes mellitus without complications: Secondary | ICD-10-CM

## 2020-12-01 DIAGNOSIS — E7800 Pure hypercholesterolemia, unspecified: Secondary | ICD-10-CM | POA: Diagnosis not present

## 2020-12-01 DIAGNOSIS — M5136 Other intervertebral disc degeneration, lumbar region: Secondary | ICD-10-CM

## 2020-12-01 NOTE — Patient Instructions (Addendum)
Visit Information  Goals Addressed            This Visit's Progress   . Set My Target A1C-Diabetes Type 2       Timeframe:  Long-Range Goal Priority:  High Start Date:     12/01/20                        Expected End Date:     06/03/21                  Follow Up Date 03/03/21   - set target A1C    Why is this important?    Your target A1C is decided together by you and your doctor.   It is based on several things like your age and other health issues.    Notes: < 7.0 at next visit!      Patient Care Plan: General Pharmacy (Adult)    Problem Identified: DM, Osteoarthritis, DDD, HLD   Priority: High  Onset Date: 12/01/2020    Long-Range Goal: Patient-Specific Goal   Start Date: 12/01/2020  Expected End Date: 06/03/2021  This Visit's Progress: On track  Priority: High  Note:   Current Barriers:  . Unable to achieve control of blood glucose   Pharmacist Clinical Goal(s):  Marland Kitchen Patient will achieve control of glucose as evidenced by A1c . adhere to plan to optimize therapeutic regimen for DM as evidenced by report of adherence to recommended medication management changes . adhere to prescribed medication regimen as evidenced by fill dates . contact provider office for questions/concerns as evidenced notation of same in electronic health record through collaboration with PharmD and provider.   Interventions: . 1:1 collaboration with [name], MD regarding development and update of comprehensive plan of care as evidenced by provider attestation and co-signature . Inter-disciplinary care team collaboration (see longitudinal plan of care) . Comprehensive medication review performed; medication list updated in electronic medical record  Hyperlipidemia: (LDL goal < 70) -Not ideally controlled -Current treatment: . Atorvastatin 20mg  daily -Medications previously tried: none noted  -Current dietary patterns: working on cutting back on sugars, denies any regular  sodas, does admit to occasional ice cream or milkshake -Current exercise habits: very active mowing yards etc. -Educated on Cholesterol goals;  Benefits of statin for ASCVD risk reduction; Importance of limiting foods high in cholesterol; -Recommended to continue current medication  Diabetes (A1c goal <7%) -Not ideally controlled -Current medications: . Jardiance 25mg  daily -Medications previously tried: Metformin (d/c)  -Current home glucose readings . fasting glucose: not checking . post prandial glucose: not checking -Denies hypoglycemic/hyperglycemic symptoms -Current meal patterns:  . breakfast: cheerios  . lunch: sandwich  . dinner: home cooked meal . snacks: almonds, cheez-itz . drinks: diet cheerwine, propel packets, water -Current exercise: see above -Educated on A1c and blood sugar goals; Prevention and management of hypoglycemic episodes; Carbohydrate counting and/or plate method -Counseled to check feet daily and get yearly eye exams -Recommended to continue current medication Recommended continuing to cut back on carbohydrates and limit to one source per meal.  Recommend repeat lipid panel. If A1c still elevated at that time would recommend adding back Metformin 500mg  daily to Jardiance.  Assessed patient finances, I believe he would qualify for PAP with Jardiance.  Application provided, patient will fill out their part and return for completion.  Osteoarthritis/DDD (Goal: Minimize symptoms) -Controlled -Current treatment  . IBU prn - not taking very often -Medications previously tried: none noted  -  Recommended to continue current medication   Patient Goals/Self-Care Activities . Patient will:  - take medications as prescribed focus on medication adherence by using pill box collaborate with provider on medication access solutions  Follow Up Plan: The care management team will reach out to the patient again over the next 120 days.        Todd Lawrence was  given information about Chronic Care Management services today including:  1. CCM service includes personalized support from designated clinical staff supervised by his physician, including individualized plan of care and coordination with other care providers 2. 24/7 contact phone numbers for assistance for urgent and routine care needs. 3. Standard insurance, coinsurance, copays and deductibles apply for chronic care management only during months in which we provide at least 20 minutes of these services. Most insurances cover these services at 100%, however patients may be responsible for any copay, coinsurance and/or deductible if applicable. This service may help you avoid the need for more expensive face-to-face services. 4. Only one practitioner may furnish and bill the service in a calendar month. 5. The patient may stop CCM services at any time (effective at the end of the month) by phone call to the office staff.  Patient agreed to services and verbal consent obtained.   The patient verbalized understanding of instructions, educational materials, and care plan provided today and agreed to receive a mailed copy of patient instructions, educational materials, and care plan.  Telephone follow up appointment with pharmacy team member scheduled for: 4 months  Todd Lawrence, [facility]  Diabetes Mellitus and Nutrition, Adult When you have diabetes, or diabetes mellitus, it is very important to have healthy eating habits because your blood sugar (glucose) levels are greatly affected by what you eat and drink. Eating healthy foods in the right amounts, at about the same times every day, can help you:  Control your blood glucose.  Lower your risk of heart disease.  Improve your blood pressure.  Reach or maintain a healthy weight. What can affect my meal plan? Every person with diabetes is different, and each person has different needs for a meal plan. Your health care provider may recommend  that you work with a dietitian to make a meal plan that is best for you. Your meal plan may vary depending on factors such as:  The calories you need.  The medicines you take.  Your weight.  Your blood glucose, blood pressure, and cholesterol levels.  Your activity level.  Other health conditions you have, such as heart or kidney disease. How do carbohydrates affect me? Carbohydrates, also called carbs, affect your blood glucose level more than any other type of food. Eating carbs naturally raises the amount of glucose in your blood. Carb counting is a method for keeping track of how many carbs you eat. Counting carbs is important to keep your blood glucose at a healthy level, especially if you use insulin or take certain oral diabetes medicines. It is important to know how many carbs you can safely have in each meal. This is different for every person. Your dietitian can help you calculate how many carbs you should have at each meal and for each snack. How does alcohol affect me? Alcohol can cause a sudden decrease in blood glucose (hypoglycemia), especially if you use insulin or take certain oral diabetes medicines. Hypoglycemia can be a life-threatening condition. Symptoms of hypoglycemia, such as sleepiness, dizziness, and confusion, are similar to symptoms of having too much alcohol.  Do  not drink alcohol if: ? Your health care provider tells you not to drink. ? You are pregnant, may be pregnant, or are planning to become pregnant.  If you drink alcohol: ? Do not drink on an empty stomach. ? Limit how much you use to:  0-1 drink a day for women.  0-2 drinks a day for men. ? Be aware of how much alcohol is in your drink. In the [place] one drink equals one 12 oz bottle of beer (355 mL), one 5 oz glass of wine (148 mL), or one 1 oz glass of hard liquor (44 mL). ? Keep yourself hydrated with water, diet soda, or unsweetened iced tea.  Keep in mind that regular soda, juice, and other  mixers may contain a lot of sugar and must be counted as carbs. What are tips for following this plan? Reading food labels  Start by checking the serving size on the "Nutrition Facts" label of packaged foods and drinks. The amount of calories, carbs, fats, and other nutrients listed on the label is based on one serving of the item. Many items contain more than one serving per package.  Check the total grams (g) of carbs in one serving. You can calculate the number of servings of carbs in one serving by dividing the total carbs by 15. For example, if a food has 30 g of total carbs per serving, it would be equal to 2 servings of carbs.  Check the number of grams (g) of saturated fats and trans fats in one serving. Choose foods that have a low amount or none of these fats.  Check the number of milligrams (mg) of salt (sodium) in one serving. Most people should limit total sodium intake to less than 2,300 mg per day.  Always check the nutrition information of foods labeled as "low-fat" or "nonfat." These foods may be higher in added sugar or refined carbs and should be avoided.  Talk to your dietitian to identify your daily goals for nutrients listed on the label. Shopping  Avoid buying canned, pre-made, or processed foods. These foods tend to be high in fat, sodium, and added sugar.  Shop around the outside edge of the grocery store. This is where you will most often find fresh fruits and vegetables, bulk grains, fresh meats, and fresh dairy. Cooking  Use low-heat cooking methods, such as baking, instead of high-heat cooking methods like deep frying.  Cook using healthy oils, such as olive, canola, or sunflower oil.  Avoid cooking with butter, cream, or high-fat meats. Meal planning  Eat meals and snacks regularly, preferably at the same times every day. Avoid going long periods of time without eating.  Eat foods that are high in fiber, such as fresh fruits, vegetables, beans, and whole  grains. Talk with your dietitian about how many servings of carbs you can eat at each meal.  Eat 4-6 oz (112-168 g) of lean protein each day, such as lean meat, chicken, fish, eggs, or tofu. One ounce (oz) of lean protein is equal to: ? 1 oz (28 g) of meat, chicken, or fish. ? 1 egg. ?  cup (62 g) of tofu.  Eat some foods each day that contain healthy fats, such as avocado, nuts, seeds, and fish.   What foods should I eat? Fruits Berries. Apples. Oranges. Peaches. Apricots. Plums. Grapes. Mango. Papaya. Pomegranate. Kiwi. Cherries. Vegetables Lettuce. Spinach. Leafy greens, including kale, chard, collard greens, and mustard greens. Beets. Cauliflower. Cabbage. Broccoli. Carrots. Green beans. Tomatoes.  Peppers. Onions. Cucumbers. Brussels sprouts. Grains Whole grains, such as whole-wheat or whole-grain bread, crackers, tortillas, cereal, and pasta. Unsweetened oatmeal. Quinoa. Brown or wild rice. Meats and other proteins Seafood. Poultry without skin. Lean cuts of poultry and beef. Tofu. Nuts. Seeds. Dairy Low-fat or fat-free dairy products such as milk, yogurt, and cheese. The items listed above may not be a complete list of foods and beverages you can eat. Contact a dietitian for more information. What foods should I avoid? Fruits Fruits canned with syrup. Vegetables Canned vegetables. Frozen vegetables with butter or cream sauce. Grains Refined white flour and flour products such as bread, pasta, snack foods, and cereals. Avoid all processed foods. Meats and other proteins Fatty cuts of meat. Poultry with skin. Breaded or fried meats. Processed meat. Avoid saturated fats. Dairy Full-fat yogurt, cheese, or milk. Beverages Sweetened drinks, such as soda or iced tea. The items listed above may not be a complete list of foods and beverages you should avoid. Contact a dietitian for more information. Questions to ask a health care provider  Do I need to meet with a diabetes  educator?  Do I need to meet with a dietitian?  What number can I call if I have questions?  When are the best times to check my blood glucose? Where to find more information:  American Diabetes Association: diabetes.org  Academy of Nutrition and Dietetics: www.eatright.[employer] of Diabetes and Digestive and Kidney Diseases: [employer]  Association of Diabetes Care and Education Specialists: www.diabeteseducator.org Summary  It is important to have healthy eating habits because your blood sugar (glucose) levels are greatly affected by what you eat and drink.  A healthy meal plan will help you control your blood glucose and maintain a healthy lifestyle.  Your health care provider may recommend that you work with a dietitian to make a meal plan that is best for you.  Keep in mind that carbohydrates (carbs) and alcohol have immediate effects on your blood glucose levels. It is important to count carbs and to use alcohol carefully. This information is not intended to replace advice given to you by your health care provider. Make sure you discuss any questions you have with your health care provider. Document Revised: 06/05/2019 Document Reviewed: 06/05/2019 Elsevier Patient Education  2021 [employer].

## 2020-12-01 NOTE — Telephone Encounter (Signed)
Left message for patient to call back and schedule Medicare Annual Wellness Visit (AWV) in office.   If not able to come in office, please offer to do virtually or by telephone.   Last AWV: 12/04/2019   Please schedule at anytime with [facility]Nurse Health Advisor.  If any questions, please contact me at [phone]

## 2020-12-11 NOTE — Progress Notes (Signed)
Subjective:   Todd Lawrence is a 70 y.o. male who presents for Medicare Annual/Subsequent preventive examination.  I connected with Merleen Milliner  today by telephone and verified that I am speaking with the correct person using two identifiers. Location patient: home Location provider: work Persons participating in the virtual visit: patient, provider.   I discussed the limitations, risks, security and privacy concerns of performing an evaluation and management service by telephone and the availability of in person appointments. I also discussed with the patient that there may be a patient responsible charge related to this service. The patient expressed understanding and verbally consented to this telephonic visit.    Interactive audio and video telecommunications were attempted between this provider and patient, however failed, due to patient having technical difficulties OR patient did not have access to video capability.  We continued and completed visit with audio only.      Review of Systems    N/A  Cardiac Risk Factors include: advanced age (>45men, >68 women);male gender;dyslipidemia;diabetes mellitus     Objective:    Today's Vitals   There is no height or weight on file to calculate BMI.  Advanced Directives 12/12/2020 02/26/2019 02/26/2019 02/16/2019  Does Patient Have a Medical Advance Directive? Yes Yes Yes Yes  Type of Paramedic of [place];Living will [place];Living will [place];Living will [place];Living will  Does patient want to make changes to medical advance directive? No - Patient declined No - Patient declined No - Patient declined No - Patient declined  Copy of [place] in Chart? No - copy requested No - copy requested No - copy requested No - copy requested    Current Medications (verified) Outpatient Encounter Medications as of 12/12/2020  Medication Sig   . albuterol (VENTOLIN HFA) 108 (90 Base) MCG/ACT inhaler Inhale 2 puffs into the lungs every 6 (six) hours as needed for wheezing or shortness of breath.  Marland Kitchen aspirin EC 325 MG EC tablet 1 tab a day for the next 30 days to prevent blood clots  . atorvastatin (LIPITOR) 20 MG tablet Take 1 tablet (20 mg total) by mouth daily.  Marland Kitchen doxycycline (VIBRA-TABS) 100 MG tablet Take 1 tablet (100 mg total) by mouth 2 (two) times daily.  . empagliflozin (JARDIANCE) 25 MG TABS tablet Take 1 tablet (25 mg total) by mouth daily before breakfast.  . ketotifen (ZADITOR) 0.025 % ophthalmic solution Place 1 drop into both eyes daily as needed (itching).  . metFORMIN (GLUCOPHAGE) 1000 MG tablet Take 1 tablet (1,000 mg total) by mouth 2 (two) times daily with a meal.  . Multiple Vitamin (MULTIVITAMIN WITH MINERALS) TABS tablet Take 1 tablet by mouth daily.  Marland Kitchen omeprazole (PRILOSEC) 40 MG capsule Take 1 capsule (40 mg total) by mouth daily.  Vladimir Faster Glycol-Propyl Glycol (SYSTANE) 0.4-0.3 % GEL ophthalmic gel Place 1 application into both eyes at bedtime.  . predniSONE (DELTASONE) 20 MG tablet 3 tabs poqday 1-2, 2 tabs poqday 3-4, 1 tab poqday 5-6  . Propylene Glycol (SYSTANE BALANCE) 0.6 % SOLN Place 1 drop into both eyes daily.   No facility-administered encounter medications on file as of 12/12/2020.    Allergies (verified) Patient has no known allergies.   History: Past Medical History:  Diagnosis Date  . Acute meniscal tear of left knee   . DDD (degenerative disc disease), lumbar    has received ESI x 3  . Diabetes mellitus without complication (  HCC)   . GERD (gastroesophageal reflux disease)   . History of blood transfusion   . History of staph infection    after thumb surgery  . Hyperlipidemia   . Left rotator cuff tear   . Primary localized osteoarthritis of left knee    Past Surgical History:  Procedure Laterality Date  . CERVICAL FUSION    . COLONOSCOPY    . FOOT SURGERY Right   . JOINT  REPLACEMENT N/A    Phreesia 12/02/2019  . KNEE ARTHROSCOPY Bilateral   . SHOULDER ARTHROSCOPY Left   . 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 TOTAL KNEE ARTHROPLASTY Right 02/26/2019   Procedure: TOTAL KNEE ARTHROPLASTY;  Surgeon: Elsie Saas, MD;  Location: [facility];  Service: Orthopedics;  Laterality: Right;   Family History  Problem Relation Age of Onset  . Heart disease Father   . Asthma Sister   . Arthritis Sister    Social History   Socioeconomic History  . Marital status: Married    Spouse name: Not on file  . Number of children: Not on file  . Years of education: Not on file  . Highest education level: Not on file  Occupational History  . Not on file  Tobacco Use  . Smoking status: Never Smoker  . Smokeless tobacco: Never Used  Vaping Use  . Vaping Use: Never used  Substance and Sexual Activity  . Alcohol use: No  . Drug use: No  . 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 Not on file  Social History Narrative  . Not on file   Social Determinants of Health   Financial Resource Strain: Low Risk   . Difficulty of Paying Living Expenses: Not hard at all  Food Insecurity: No Food Insecurity  . Worried About Charity fundraiser in the Last Year: Never true  . Ran Out of Food in the Last Year: Never true  Transportation Needs: No Transportation Needs  . Lack of Transportation (Medical): No  . Lack of Transportation (Non-Medical): No  Physical Activity: Inactive  . Days of Exercise per Week: 0 days  . Minutes of Exercise per Session: 0 min  Stress: No Stress Concern Present  . Feeling of Stress : Not at all  Social Connections: Moderately Integrated  . Frequency of Communication with Friends and Family: Three times a week  . Frequency of Social Gatherings with Friends and Family: More than three times a week  . Attends Religious Services: More than 4 times per year  .  Active Member of Clubs or Organizations: No  . Attends Archivist Meetings: Never  . Marital Status: Married    Tobacco Counseling Counseling given: Not Answered   Clinical Intake:  Pre-visit preparation completed: Yes  Pain : No/denies pain     Nutritional Risks: None Diabetes: Yes CBG done?: No Did pt. bring in CBG monitor from home?: No  How often do you need to have someone help you when you read instructions, pamphlets, or other written materials from your doctor or pharmacy?: 1 - Never  Diabetic?Yes Nutrition Risk Assessment:  Has the patient had any N/V/D within the last 2 months?  No  Does the patient have any non-healing wounds?  No  Has the patient had any unintentional weight loss or weight gain?  No   Diabetes:  Is the patient diabetic?  Yes  If diabetic, was a CBG obtained today?  No  Did the patient bring in their glucometer from home?  No  How often do you monitor your CBG's? Patient states does not check his blood sugars .   Financial Strains and Diabetes Management:  Are you having any financial strains with the device, your supplies or your medication? No .  Does the patient want to be seen by Chronic Care Management for management of their diabetes?  No  Would the patient like to be referred to a Nutritionist or for Diabetic Management?  No   Diabetic Exams:  Diabetic Eye Exam: Overdue for diabetic eye exam. Pt has been advised about the importance in completing this exam. Patient advised to call and schedule an eye exam. Diabetic Foot Exam: Overdue, Pt has been advised about the importance in completing this exam. Pt is scheduled for diabetic foot exam on next scheduled office visit.   Interpreter Needed?: No  Information entered by :: [place] of Daily Living In your present state of health, do you have any difficulty performing the following activities: 12/12/2020  Hearing? N  Vision? N  Difficulty concentrating  or making decisions? N  Walking or climbing stairs? N  Dressing or bathing? N  Doing errands, shopping? N  Preparing Food and eating ? N  Using the Toilet? N  In the past six months, have you accidently leaked urine? N  Do you have problems with loss of bowel control? N  Managing your Medications? N  Managing your Finances? N  Housekeeping or managing your Housekeeping? N  Some recent data might be hidden    Patient Care Team: [name], MD as PCP - General (Family Medicine) Edythe Clarity, [facility] as Pharmacist (Pharmacist)  Indicate any recent Medical Services you may have received from other than [facility] providers in the past year (date may be approximate).     Assessment:   This is a routine wellness examination for Caron.  Hearing/Vision screen  Hearing Screening   125Hz  250Hz  500Hz  1000Hz  2000Hz  3000Hz  4000Hz  6000Hz  8000Hz   Right ear:           Left ear:           Vision Screening Comments: Patient states gets eyes examined once per year. Currently wears glasses    Dietary issues and exercise activities discussed: Current Exercise Habits: The patient has a physically strenuous job, but has no regular exercise apart from work., Exercise limited by: None identified  Goals Addressed            This Visit's Progress   . Prevent falls        Depression Screen PHQ 2/9 Scores 12/12/2020 12/04/2019 04/04/2018 04/04/2018 09/13/2017 11/16/2016 01/31/2015  PHQ - 2 Score 0 0 0 0 0 0 0  PHQ- 9 Score - - - - - 1 -    Fall Risk Fall Risk  12/12/2020 04/04/2018 04/04/2018 09/13/2017 01/31/2015  Falls in the past year? 0 No No No No  Number falls in past yr: 0 - - - -  Injury with Fall? 0 - - - -  Risk for fall due to : No Fall Risks - - - -  Follow up Falls evaluation completed;Falls prevention discussed - - - -    FALL RISK PREVENTION PERTAINING TO THE HOME:  Any stairs in or around the home? Yes  If so, are there any without handrails? No  Home free of loose throw rugs in  walkways, pet beds, electrical cords, etc? Yes  Adequate lighting in your home to reduce risk of falls? Yes   ASSISTIVE DEVICES UTILIZED TO PREVENT FALLS:  Life alert? No  Use of a cane, walker or w/c? No  Grab bars in the bathroom? Yes  Shower chair or bench in shower? Yes  Elevated toilet seat or a handicapped toilet? Yes    Cognitive Function:   Normal cognitive status assessed by direct observation by this Nurse Health Advisor. No abnormalities found.        Immunizations Immunization History  Administered Date(s) Administered  . Influenza,inj,Quad PF,6+ Mos 04/04/2018  . PFIZER(Purple Top)SARS-COV-2 Vaccination 08/06/2019, 08/27/2019  . Pneumococcal Conjugate-13 11/16/2016  . Pneumococcal Polysaccharide-23 01/31/2015  . Tdap 09/25/2013, 08/09/2015  . Zoster, Live 11/06/2013    TDAP status: Up to date  Flu Vaccine status: Up to date  Pneumococcal vaccine status: Up to date  Covid-19 vaccine status: Completed vaccines  Qualifies for Shingles Vaccine? Yes   Zostavax completed Yes   Shingrix Completed?: No.    Education has been provided regarding the importance of this vaccine. Patient has been advised to call insurance company to determine out of pocket expense if they have not yet received this vaccine. Advised may also receive vaccine at local pharmacy or Health Dept. Verbalized acceptance and understanding.  Screening Tests Health Maintenance  Topic Date Due  . Pneumococcal Vaccine 75-38 Years old (1 of 4 - PCV13) Never done  . Zoster Vaccines- Shingrix (1 of 2) Never done  . FOOT EXAM  04/05/2019  . COVID-19 Vaccine (3 - Booster for Pfizer series) 01/24/2020  . OPHTHALMOLOGY EXAM  03/27/2020  . URINE MICROALBUMIN  12/03/2020  . INFLUENZA VACCINE  02/09/2021  . HEMOGLOBIN A1C  02/18/2021  . TETANUS/TDAP  08/08/2025  . COLONOSCOPY (Pts 45-37yrs Insurance coverage will need to be confirmed)  03/27/2029  . Hepatitis C Screening  Completed  . PNA vac Low Risk  Adult  Completed  . HPV VACCINES  Aged Out    Health Maintenance  Health Maintenance Due  Topic Date Due  . Pneumococcal Vaccine 12-13 Years old (1 of 4 - PCV13) Never done  . Zoster Vaccines- Shingrix (1 of 2) Never done  . FOOT EXAM  04/05/2019  . COVID-19 Vaccine (3 - Booster for Pfizer series) 01/24/2020  . OPHTHALMOLOGY EXAM  03/27/2020  . URINE MICROALBUMIN  12/03/2020    Colorectal cancer screening: Type of screening: Colonoscopy. Completed 05/08/2018. Repeat every 10 years  Lung Cancer Screening: (Low Dose CT Chest recommended if Age 62-80 years, 30 pack-year currently smoking OR have quit w/in 15years.) does not qualify.   Lung Cancer Screening Referral: N/A   Additional Screening:  Hepatitis C Screening: does qualify; Completed 04/04/2018  Vision Screening: Recommended annual ophthalmology exams for early detection of glaucoma and other disorders of the eye. Is the patient up to date with their annual eye exam?  Yes  Who is the provider or what is the name of the office in which the patient attends annual eye exams? Dr. Wyatt Portela If pt is not established with a provider, would they like to be referred to a provider to establish care? No .   Dental Screening: Recommended annual dental exams for proper oral hygiene  Community Resource Referral / Chronic Care Management: CRR required this visit?  No   CCM required this visit?  No      Plan:     I have personally reviewed and noted the following in  the patient's chart:   . Medical and social history . Use of alcohol, tobacco or illicit drugs  . Current medications and supplements including opioid prescriptions. Patient is not currently taking opioid prescriptions. . Functional ability and status . Nutritional status . Physical activity . Advanced directives . List of other physicians . Hospitalizations, surgeries, and ER visits in previous 12 months . Vitals . Screenings to include cognitive, depression,  and falls . Referrals and appointments  In addition, I have reviewed and discussed with patient certain preventive protocols, quality metrics, and best practice recommendations. A written personalized care plan for preventive services as well as general preventive health recommendations were provided to patient.     Ofilia Neas, LPN   09/10/9516   Nurse Notes: None

## 2020-12-12 ENCOUNTER — Ambulatory Visit (INDEPENDENT_AMBULATORY_CARE_PROVIDER_SITE_OTHER): Payer: Medicare HMO

## 2020-12-12 DIAGNOSIS — Z0000 Encounter for general adult medical examination without abnormal findings: Secondary | ICD-10-CM | POA: Diagnosis not present

## 2020-12-12 NOTE — Patient Instructions (Signed)
Todd Lawrence , Thank you for taking time to come for your Medicare Wellness Visit. I appreciate your ongoing commitment to your health goals. Please review the following plan we discussed and let me know if I can assist you in the future.   Screening recommendations/referrals: Colonoscopy: Up to date, next due 05/08/2028 Recommended yearly ophthalmology/optometry visit for glaucoma screening and checkup Recommended yearly dental visit for hygiene and checkup  Vaccinations: Influenza vaccine: Up to date, next due fall 2022  Pneumococcal vaccine: Completed series  Tdap vaccine: Up to date, next due 08/08/2025 Shingles vaccine: Up to date, you could get the newer Shingles vaccine Shingrix if you would like. It is recommended that you receive this at your local pharmacy.    Advanced directives: Please bring in copies of your advanced medical directives so that we may scan them into your chart.   Conditions/risks identified: None   Next appointment: None   Preventive Care 65 Years and Older, Male Preventive care refers to lifestyle choices and visits with your health care provider that can promote health and wellness. What does preventive care include?  A yearly physical exam. This is also called an annual well check.  Dental exams once or twice a year.  Routine eye exams. Ask your health care provider how often you should have your eyes checked.  Personal lifestyle choices, including:  Daily care of your teeth and gums.  Regular physical activity.  Eating a healthy diet.  Avoiding tobacco and drug use.  Limiting alcohol use.  Practicing safe sex.  Taking low doses of aspirin every day.  Taking vitamin and mineral supplements as recommended by your health care provider. What happens during an annual well check? The services and screenings done by your health care provider during your annual well check will depend on your age, overall health, lifestyle risk factors, and family  history of disease. Counseling  Your health care provider may ask you questions about your:  Alcohol use.  Tobacco use.  Drug use.  Emotional well-being.  Home and relationship well-being.  Sexual activity.  Eating habits.  History of falls.  Memory and ability to understand (cognition).  Work and work Statistician. Screening  You may have the following tests or measurements:  Height, weight, and BMI.  Blood pressure.  Lipid and cholesterol levels. These may be checked every 5 years, or more frequently if you are over 54 years old.  Skin check.  Lung cancer screening. You may have this screening every year starting at age 32 if you have a 30-pack-year history of smoking and currently smoke or have quit within the past 15 years.  Fecal occult blood test (FOBT) of the stool. You may have this test every year starting at age 44.  Flexible sigmoidoscopy or colonoscopy. You may have a sigmoidoscopy every 5 years or a colonoscopy every 10 years starting at age 58.  Prostate cancer screening. Recommendations will vary depending on your family history and other risks.  Hepatitis C blood test.  Hepatitis B blood test.  Sexually transmitted disease (STD) testing.  Diabetes screening. This is done by checking your blood sugar (glucose) after you have not eaten for a while (fasting). You may have this done every 1-3 years.  Abdominal aortic aneurysm (AAA) screening. You may need this if you are a current or former smoker.  Osteoporosis. You may be screened starting at age 79 if you are at high risk. Talk with your health care provider about your test results, treatment options,  and if necessary, the need for more tests. Vaccines  Your health care provider may recommend certain vaccines, such as:  Influenza vaccine. This is recommended every year.  Tetanus, diphtheria, and acellular pertussis (Tdap, Td) vaccine. You may need a Td booster every 10 years.  Zoster vaccine.  You may need this after age 8.  Pneumococcal 13-valent conjugate (PCV13) vaccine. One dose is recommended after age 72.  Pneumococcal polysaccharide (PPSV23) vaccine. One dose is recommended after age 4. Talk to your health care provider about which screenings and vaccines you need and how often you need them. This information is not intended to replace advice given to you by your health care provider. Make sure you discuss any questions you have with your health care provider. Document Released: 07/25/2015 Document Revised: 03/17/2016 Document Reviewed: 04/29/2015 Elsevier Interactive Patient Education  2017 [place] Prevention in the Home Falls can cause injuries. They can happen to people of all ages. There are many things you can do to make your home safe and to help prevent falls. What can I do on the outside of my home?  Regularly fix the edges of walkways and driveways and fix any cracks.  Remove anything that might make you trip as you walk through a door, such as a raised step or threshold.  Trim any bushes or trees on the path to your home.  Use bright outdoor lighting.  Clear any walking paths of anything that might make someone trip, such as rocks or tools.  Regularly check to see if handrails are loose or broken. Make sure that both sides of any steps have handrails.  Any raised decks and porches should have guardrails on the edges.  Have any leaves, snow, or ice cleared regularly.  Use sand or salt on walking paths during winter.  Clean up any spills in your garage right away. This includes oil or grease spills. What can I do in the bathroom?  Use night lights.  Install grab bars by the toilet and in the tub and shower. Do not use towel bars as grab bars.  Use non-skid mats or decals in the tub or shower.  If you need to sit down in the shower, use a plastic, non-slip stool.  Keep the floor dry. Clean up any water that spills on the floor as soon  as it happens.  Remove soap buildup in the tub or shower regularly.  Attach bath mats securely with double-sided non-slip rug tape.  Do not have throw rugs and other things on the floor that can make you trip. What can I do in the bedroom?  Use night lights.  Make sure that you have a light by your bed that is easy to reach.  Do not use any sheets or blankets that are too big for your bed. They should not hang down onto the floor.  Have a firm chair that has side arms. You can use this for support while you get dressed.  Do not have throw rugs and other things on the floor that can make you trip. What can I do in the kitchen?  Clean up any spills right away.  Avoid walking on wet floors.  Keep items that you use a lot in easy-to-reach places.  If you need to reach something above you, use a strong step stool that has a grab bar.  Keep electrical cords out of the way.  Do not use floor polish or wax that makes floors slippery. If  you must use wax, use non-skid floor wax.  Do not have throw rugs and other things on the floor that can make you trip. What can I do with my stairs?  Do not leave any items on the stairs.  Make sure that there are handrails on both sides of the stairs and use them. Fix handrails that are broken or loose. Make sure that handrails are as long as the stairways.  Check any carpeting to make sure that it is firmly attached to the stairs. Fix any carpet that is loose or worn.  Avoid having throw rugs at the top or bottom of the stairs. If you do have throw rugs, attach them to the floor with carpet tape.  Make sure that you have a light switch at the top of the stairs and the bottom of the stairs. If you do not have them, ask someone to add them for you. What else can I do to help prevent falls?  Wear shoes that:  Do not have high heels.  Have rubber bottoms.  Are comfortable and fit you well.  Are closed at the toe. Do not wear sandals.  If  you use a stepladder:  Make sure that it is fully opened. Do not climb a closed stepladder.  Make sure that both sides of the stepladder are locked into place.  Ask someone to hold it for you, if possible.  Clearly mark and make sure that you can see:  Any grab bars or handrails.  First and last steps.  Where the edge of each step is.  Use tools that help you move around (mobility aids) if they are needed. These include:  Canes.  Walkers.  Scooters.  Crutches.  Turn on the lights when you go into a dark area. Replace any light bulbs as soon as they burn out.  Set up your furniture so you have a clear path. Avoid moving your furniture around.  If any of your floors are uneven, fix them.  If there are any pets around you, be aware of where they are.  Review your medicines with your doctor. Some medicines can make you feel dizzy. This can increase your chance of falling. Ask your doctor what other things that you can do to help prevent falls. This information is not intended to replace advice given to you by your health care provider. Make sure you discuss any questions you have with your health care provider. Document Released: 04/24/2009 Document Revised: 12/04/2015 Document Reviewed: 08/02/2014 Elsevier Interactive Patient Education  2017 [employer].

## 2020-12-18 ENCOUNTER — Other Ambulatory Visit: Payer: Self-pay | Admitting: Family Medicine

## 2020-12-30 ENCOUNTER — Other Ambulatory Visit: Payer: Self-pay

## 2020-12-30 DIAGNOSIS — E119 Type 2 diabetes mellitus without complications: Secondary | ICD-10-CM

## 2020-12-30 MED ORDER — EMPAGLIFLOZIN 25 MG PO TABS: 25.0000 mg | ORAL_TABLET | Freq: Every day | ORAL | 3 refills | Status: DC

## 2021-01-02 ENCOUNTER — Telehealth: Payer: Self-pay | Admitting: *Deleted

## 2021-01-02 ENCOUNTER — Other Ambulatory Visit: Payer: Self-pay

## 2021-01-02 ENCOUNTER — Other Ambulatory Visit: Payer: Medicare HMO

## 2021-01-02 DIAGNOSIS — E119 Type 2 diabetes mellitus without complications: Secondary | ICD-10-CM | POA: Diagnosis not present

## 2021-01-02 DIAGNOSIS — Z136 Encounter for screening for cardiovascular disorders: Secondary | ICD-10-CM

## 2021-01-02 DIAGNOSIS — E7800 Pure hypercholesterolemia, unspecified: Secondary | ICD-10-CM | POA: Diagnosis not present

## 2021-01-02 DIAGNOSIS — Z711 Person with feared health complaint in whom no diagnosis is made: Secondary | ICD-10-CM

## 2021-01-02 DIAGNOSIS — R634 Abnormal weight loss: Secondary | ICD-10-CM

## 2021-01-02 DIAGNOSIS — Z125 Encounter for screening for malignant neoplasm of prostate: Secondary | ICD-10-CM

## 2021-01-02 DIAGNOSIS — Z13220 Encounter for screening for lipoid disorders: Secondary | ICD-10-CM | POA: Diagnosis not present

## 2021-01-02 LAB — TSH: TSH: 1.32 mIU/L (ref 0.40–4.50)

## 2021-01-02 LAB — PSA: PSA: 0.32 ng/mL (ref <=4.00)

## 2021-01-02 NOTE — Telephone Encounter (Signed)
Patient in office for routine labs to F/U DM/ HLD.   Reports that he is concerned about possible weight loss in regards to Jardiance.   Weight  8-Feb 166 lb  24-Jun 157 lb   Advised that controlling DM can lead to weight loss, and the use of Metformin can cause weight loss. Advised that after labs resulted,he can schedule OV to discuss with PCP.   Also reports that he has discoloration on nose that spouse is concerned about. Noted 60mm circular brown patch to end of L nare with irregular borders. Referral placed to dermatology for evaluation.

## 2021-01-03 LAB — HEMOGLOBIN A1C
Hgb A1c MFr Bld: 7.9 % of total Hgb — ABNORMAL HIGH (ref <5.7)
Mean Plasma Glucose: 180 mg/dL
eAG (mmol/L): 10 mmol/L

## 2021-01-03 LAB — COMPLETE METABOLIC PANEL WITH GFR
AG Ratio: 1.9 (calc) (ref 1.0–2.5)
ALT: 23 U/L (ref 9–46)
AST: 27 U/L (ref 10–35)
Albumin: 4.4 g/dL (ref 3.6–5.1)
Alkaline phosphatase (APISO): 62 U/L (ref 35–144)
BUN: 21 mg/dL (ref 7–25)
CO2: 29 mmol/L (ref 20–32)
Calcium: 9.8 mg/dL (ref 8.6–10.3)
Chloride: 99 mmol/L (ref 98–110)
Creat: 0.86 mg/dL (ref 0.70–1.18)
GFR, Est African American: 101 mL/min/{1.73_m2} (ref >=60)
GFR, Est Non African American: 87 mL/min/{1.73_m2} (ref >=60)
Globulin: 2.3 g/dL (calc) (ref 1.9–3.7)
Glucose, Bld: 194 mg/dL — ABNORMAL HIGH (ref 65–99)
Potassium: 5.1 mmol/L (ref 3.5–5.3)
Sodium: 137 mmol/L (ref 135–146)
Total Bilirubin: 0.4 mg/dL (ref 0.2–1.2)
Total Protein: 6.7 g/dL (ref 6.1–8.1)

## 2021-01-03 LAB — CBC WITH DIFFERENTIAL/PLATELET
Absolute Monocytes: 693 cells/uL (ref 200–950)
Basophils Absolute: 38 cells/uL (ref 0–200)
Basophils Relative: 0.6 %
Eosinophils Absolute: 120 cells/uL (ref 15–500)
Eosinophils Relative: 1.9 %
HCT: 44.9 % (ref 38.5–50.0)
Hemoglobin: 14.5 g/dL (ref 13.2–17.1)
Lymphs Abs: 1588 cells/uL (ref 850–3900)
MCH: 30.2 pg (ref 27.0–33.0)
MCHC: 32.3 g/dL (ref 32.0–36.0)
MCV: 93.5 fL (ref 80.0–100.0)
MPV: 10.6 fL (ref 7.5–12.5)
Monocytes Relative: 11 %
Neutro Abs: 3862 cells/uL (ref 1500–7800)
Neutrophils Relative %: 61.3 %
Platelets: 232 10*3/uL (ref 140–400)
RBC: 4.8 10*6/uL (ref 4.20–5.80)
RDW: 13.1 % (ref 11.0–15.0)
Total Lymphocyte: 25.2 %
WBC: 6.3 10*3/uL (ref 3.8–10.8)

## 2021-01-03 LAB — LIPID PANEL
Cholesterol: 188 mg/dL (ref <200)
HDL: 78 mg/dL (ref >=40)
LDL Cholesterol (Calc): 97 mg/dL (calc)
Non-HDL Cholesterol (Calc): 110 mg/dL (calc) (ref <130)
Total CHOL/HDL Ratio: 2.4 (calc) (ref <5.0)
Triglycerides: 43 mg/dL (ref <150)

## 2021-01-03 LAB — MICROALBUMIN / CREATININE URINE RATIO
Creatinine, Urine: 12 mg/dL — ABNORMAL LOW (ref 20–320)
Microalb, Ur: <0.2 mg/dL

## 2021-01-08 DIAGNOSIS — D1801 Hemangioma of skin and subcutaneous tissue: Secondary | ICD-10-CM | POA: Diagnosis not present

## 2021-01-08 DIAGNOSIS — D692 Other nonthrombocytopenic purpura: Secondary | ICD-10-CM | POA: Diagnosis not present

## 2021-01-08 DIAGNOSIS — L570 Actinic keratosis: Secondary | ICD-10-CM | POA: Diagnosis not present

## 2021-01-08 DIAGNOSIS — L821 Other seborrheic keratosis: Secondary | ICD-10-CM | POA: Diagnosis not present

## 2021-01-08 DIAGNOSIS — L814 Other melanin hyperpigmentation: Secondary | ICD-10-CM | POA: Diagnosis not present

## 2021-01-08 DIAGNOSIS — D225 Melanocytic nevi of trunk: Secondary | ICD-10-CM | POA: Diagnosis not present

## 2021-01-15 DIAGNOSIS — S46012A Strain of muscle(s) and tendon(s) of the rotator cuff of left shoulder, initial encounter: Secondary | ICD-10-CM | POA: Diagnosis not present

## 2021-01-15 DIAGNOSIS — M19012 Primary osteoarthritis, left shoulder: Secondary | ICD-10-CM | POA: Diagnosis not present

## 2021-01-16 ENCOUNTER — Encounter: Payer: Self-pay | Admitting: Family Medicine

## 2021-01-16 ENCOUNTER — Ambulatory Visit (INDEPENDENT_AMBULATORY_CARE_PROVIDER_SITE_OTHER): Payer: Medicare HMO | Admitting: Family Medicine

## 2021-01-16 ENCOUNTER — Other Ambulatory Visit: Payer: Self-pay

## 2021-01-16 VITALS — BP 134/68 | HR 84 | Temp 98.0°F | Resp 16 | Ht 70.0 in | Wt 159.0 lb

## 2021-01-16 DIAGNOSIS — E7800 Pure hypercholesterolemia, unspecified: Secondary | ICD-10-CM | POA: Diagnosis not present

## 2021-01-16 DIAGNOSIS — E119 Type 2 diabetes mellitus without complications: Secondary | ICD-10-CM | POA: Diagnosis not present

## 2021-01-16 DIAGNOSIS — R634 Abnormal weight loss: Secondary | ICD-10-CM | POA: Diagnosis not present

## 2021-01-16 NOTE — Progress Notes (Signed)
Subjective:    Patient ID: Todd Lawrence, male    DOB: [birth year], 70 y.o.   MRN: [id number]  HPI Wt Readings from Last 3 Encounters:  01/16/21 159 lb (72.1 kg)  08/19/20 166 lb (75.3 kg)  12/04/19 171 lb (77.6 kg)   Patient's weight is down about 7 pounds from February.  Meanwhile his A1c is up to 7.9.  He stopped metformin and added Jardiance.  Patient works vigorously.  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  Furthermore his sugar has been elevated coupled with the Jardiance as well I believe he is losing weight due to polyuria.  He denies any chest pain or shortness of breath or dyspnea on exertion or hemoptysis or coughing.  He denies any melena or hematochezia or tachycardia.  He denies any abdominal pain.  He denies any fevers or chills or night sweats.  He is getting cramping in his legs which I believe is a sign of dehydration.  Diabetic foot exam was performed today and is normal Lab on 01/02/2021  Component Date Value Ref Range Status   WBC 01/02/2021 6.3  3.8 - 10.8 Thousand/uL Final   RBC 01/02/2021 4.80  4.20 - 5.80 Million/uL Final   Hemoglobin 01/02/2021 14.5  13.2 - 17.1 g/dL Final   HCT 01/02/2021 44.9  38.5 - 50.0 % Final   MCV 01/02/2021 93.5  80.0 - 100.0 fL Final   MCH 01/02/2021 30.2  27.0 - 33.0 pg Final   MCHC 01/02/2021 32.3  32.0 - 36.0 g/dL Final   RDW 01/02/2021 13.1  11.0 - 15.0 % Final   Platelets 01/02/2021 232  140 - 400 Thousand/uL Final   MPV 01/02/2021 10.6  7.5 - 12.5 fL Final   Neutro Abs 01/02/2021 3,862  1,500 - 7,800 cells/uL Final   Lymphs Abs 01/02/2021 1,588  850 - 3,900 cells/uL Final   Absolute Monocytes 01/02/2021 693  200 - 950 cells/uL Final   Eosinophils Absolute 01/02/2021 120  15 - 500 cells/uL Final   Basophils Absolute 01/02/2021 38  0 - 200 cells/uL Final   Neutrophils Relative % 01/02/2021 61.3  % Final   Total Lymphocyte 01/02/2021 25.2  % Final   Monocytes Relative 01/02/2021 11.0  % Final    Eosinophils Relative 01/02/2021 1.9  % Final   Basophils Relative 01/02/2021 0.6  % Final   Glucose, Bld 01/02/2021 194 (A) 65 - 99 mg/dL Final   Comment: .            Fasting reference interval . For someone without known diabetes, a glucose value >125 mg/dL indicates that they may have diabetes and this should be confirmed with a follow-up test. .    BUN 01/02/2021 21  7 - 25 mg/dL Final   Creat 01/02/2021 0.86  0.70 - 1.18 mg/dL Final   Comment: For patients >46 years of age, the reference limit for Creatinine is approximately 13% higher for people identified as African-American. .    GFR, Est Non African American 01/02/2021 87  > OR = 60 mL/min/1.63m2 Final   GFR, Est African American 01/02/2021 101  > OR = 60 mL/min/1.28m2 Final   BUN/Creatinine Ratio 66/12/3014 NOT APPLICABLE  6 - 22 (calc) Final   Sodium 01/02/2021 137  135 - 146 mmol/L Final   Potassium 01/02/2021 5.1  3.5 - 5.3 mmol/L Final   Chloride 01/02/2021 99  98 - 110 mmol/L Final   CO2 01/02/2021 29  20 - 32  mmol/L Final   Calcium 01/02/2021 9.8  8.6 - 10.3 mg/dL Final   Total Protein 01/02/2021 6.7  6.1 - 8.1 g/dL Final   Albumin 01/02/2021 4.4  3.6 - 5.1 g/dL Final   Globulin 01/02/2021 2.3  1.9 - 3.7 g/dL (calc) Final   AG Ratio 01/02/2021 1.9  1.0 - 2.5 (calc) Final   Total Bilirubin 01/02/2021 0.4  0.2 - 1.2 mg/dL Final   Alkaline phosphatase (APISO) 01/02/2021 62  35 - 144 U/L Final   AST 01/02/2021 27  10 - 35 U/L Final   ALT 01/02/2021 23  9 - 46 U/L Final   Hgb A1c MFr Bld 01/02/2021 7.9 (A) <5.7 % of total Hgb Final   Comment: For someone without known diabetes, a hemoglobin A1c value of 6.5% or greater indicates that they may have  diabetes and this should be confirmed with a follow-up  test. . For someone with known diabetes, a value <7% indicates  that their diabetes is well controlled and a value  greater than or equal to 7% indicates suboptimal  control. A1c targets should be individualized  based on  duration of diabetes, age, comorbid conditions, and  other considerations. . Currently, no consensus exists regarding use of hemoglobin A1c for diagnosis of diabetes for children. .    Mean Plasma Glucose 01/02/2021 180  mg/dL Final   eAG (mmol/L) 01/02/2021 10.0  mmol/L Final   Cholesterol 01/02/2021 188  <200 mg/dL Final   HDL 01/02/2021 78  > OR = 40 mg/dL Final   Triglycerides 01/02/2021 43  <150 mg/dL Final   LDL Cholesterol (Calc) 01/02/2021 97  mg/dL (calc) Final   Comment: Reference range: <100 . Desirable range <100 mg/dL for primary prevention;   <70 mg/dL for patients with CHD or diabetic patients  with > or = 2 CHD risk factors. Marland Kitchen LDL-C is now calculated using the Martin-Hopkins  calculation, which is a validated novel method providing  better accuracy than the Friedewald equation in the  estimation of LDL-C.  Cresenciano Genre et al. Annamaria Helling. 1517;616(07): 2061-2068  (http://education.[facility].com/faq/FAQ164)    Total CHOL/HDL Ratio 01/02/2021 2.4  <5.0 (calc) Final   Non-HDL Cholesterol (Calc) 01/02/2021 110  <130 mg/dL (calc) Final   Comment: For patients with diabetes plus 1 major ASCVD risk  factor, treating to a non-HDL-C goal of <100 mg/dL  (LDL-C of <70 mg/dL) is considered a therapeutic  option.    Creatinine, Urine 01/02/2021 12 (A) 20 - 320 mg/dL Final   Microalb, Ur 01/02/2021 <0.2  mg/dL Final   Comment: Reference Range Not established    Microalb Creat Ratio 01/02/2021 NOTE  <30 mcg/mg creat Final   Comment: NOTE: The urine albumin value is less than  0.2 mg/dL therefore we are unable to calculate  excretion and/or creatinine ratio. . The ADA defines abnormalities in albumin excretion as follows: Marland Kitchen Albuminuria Category        Result (mcg/mg creatinine) . Normal to Mildly increased   <30 Moderately increased         30-299  Severely increased           > OR = 300 . The ADA recommends that at least two of three specimens collected  within a 3-6 month period be abnormal before considering a patient to be within a diagnostic category.    TSH 01/02/2021 1.32  0.40 - 4.50 mIU/L Final   PSA 01/02/2021 0.32  < OR = 4.00 ng/mL Final   Comment: The total PSA  value from this assay system is  standardized against the WHO standard. The test  result will be approximately 20% lower when compared  to the equimolar-standardized total PSA (Beckman  Coulter). Comparison of serial PSA results should be  interpreted with this fact in mind. . This test was performed using the Siemens  chemiluminescent method. Values obtained from  different assay methods cannot be used interchangeably. PSA levels, regardless of value, should not be interpreted as absolute evidence of the presence or absence of disease.     Immunization History  Administered Date(s) Administered   Influenza,inj,Quad PF,6+ Mos 04/04/2018   PFIZER(Purple Top)SARS-COV-2 Vaccination 08/06/2019, 08/27/2019   Pneumococcal Conjugate-13 11/16/2016   Pneumococcal Polysaccharide-23 01/31/2015   Tdap 09/25/2013, 08/09/2015   Zoster, Live 11/06/2013   Past Medical History:  Diagnosis Date   Acute meniscal tear of left knee    DDD (degenerative disc disease), lumbar    has received ESI x 3   Diabetes mellitus without complication (HCC)    GERD (gastroesophageal reflux disease)    History of blood transfusion    History of staph infection    after thumb surgery   Hyperlipidemia    Left rotator cuff tear    Primary localized osteoarthritis of left knee    Past Surgical History:  Procedure Laterality Date   CERVICAL FUSION     COLONOSCOPY     FOOT SURGERY Right    JOINT REPLACEMENT N/A    [employer] 12/02/2019   KNEE ARTHROSCOPY Bilateral    SHOULDER ARTHROSCOPY Left    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 8 g 2   aspirin EC 325 MG EC tablet 1 tab a day for the next 30 days to prevent blood clots 30 tablet 0   atorvastatin (LIPITOR) 20 MG tablet TAKE 1 TABLET BY MOUTH EVERY DAY 90 tablet 3   doxycycline (VIBRA-TABS) 100 MG tablet Take 1 tablet (100 mg total) by mouth 2 (two) times daily. 20 tablet 0   empagliflozin (JARDIANCE) 25 MG TABS tablet Take 1 tablet (25 mg total) by mouth daily before breakfast. 30 tablet 3   ketotifen (ZADITOR) 0.025 % ophthalmic solution Place 1 drop into both eyes daily as needed (itching).     metFORMIN (GLUCOPHAGE) 1000 MG tablet Take 1 tablet (1,000 mg total) by mouth 2 (two) times daily with a meal. 180 tablet 3   Multiple Vitamin (MULTIVITAMIN WITH MINERALS) TABS tablet Take 1 tablet by mouth daily.     omeprazole (PRILOSEC) 40 MG capsule Take 1 capsule (40 mg total) by mouth daily. 30 capsule 11   Polyethyl Glycol-Propyl Glycol (SYSTANE) 0.4-0.3 % GEL ophthalmic gel Place 1 application into both eyes at bedtime.     predniSONE (DELTASONE) 20 MG tablet 3 tabs poqday 1-2, 2 tabs poqday 3-4, 1 tab poqday 5-6 12 tablet 0   Propylene Glycol (SYSTANE BALANCE) 0.6 % SOLN Place 1 drop into both eyes daily.     No current facility-administered medications on file prior to visit.   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  Other Topics Concern   Not on file  Social History Narrative   Not on file   Social Determinants of Health   Financial Resource Strain: Low Risk    Difficulty of Paying Living Expenses: Not hard at all  Food Insecurity: No Food Insecurity   Worried About Charity fundraiser in the Last Year: Never true   Arboriculturist in the Last Year: Never true  Transportation Needs: No Transportation Needs   Lack of Transportation (Medical): No   Lack of Transportation (Non-Medical): No  Physical Activity: Inactive   Days of Exercise per Week: 0 days   Minutes of Exercise per Session: 0 min  Stress: No Stress Concern Present   Feeling of Stress : Not at all  Social Connections: Moderately Integrated   Frequency of Communication with Friends and Family: Three times a week   Frequency of Social Gatherings with Friends and Family: More than three times a week   Attends Religious Services: More than 4 times per year   Active Member of [employer] or Organizations: No   Attends Archivist Meetings: Never   Marital Status: Married  Human resources officer Violence: Not At Risk   Fear of Current or Ex-Partner: No   Emotionally Abused: No   Physically Abused: No   Sexually Abused: No   Family History  Problem Relation Age of Onset   Heart disease Father    Asthma Sister    Arthritis Sister      Review of Systems     Objective:   Physical Exam Vitals reviewed.  Constitutional:      General: He is not in acute distress.    Appearance: Normal appearance. He is normal weight.  Neck:     Vascular: No carotid bruit.  Cardiovascular:     Rate and Rhythm: Normal rate and regular rhythm.     Pulses: Normal pulses.     Heart sounds: Normal heart sounds. No murmur heard. Pulmonary:     Effort: Pulmonary effort is normal. No respiratory distress.     Breath sounds: Normal breath sounds.  No wheezing, rhonchi or rales.  Abdominal:     General: Abdomen is flat. Bowel sounds are normal. There is no distension.     Palpations: Abdomen is soft.     Tenderness: There is no abdominal tenderness. There is no guarding.  Musculoskeletal:     Cervical back: Neck supple.  Lymphadenopathy:     Cervical: No cervical adenopathy.  Skin:    Coloration: Skin is not jaundiced.     Findings: No bruising, lesion or rash.  Neurological:     General: No focal deficit present.     Mental Status: He is alert and oriented to person, place, and time.     Cranial Nerves: No cranial nerve deficit.     Motor: No weakness.     Gait:  Gait normal.          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  Therefore I would start metformin and gradually increase to 1000 mg twice a day to try to lower his hyperglycemia and reduce the polyuria that this causes.  I encouraged him to try to drink more fluids specifically Gatorade 0 to stop the cramping and dehydration.  The remainder of his lab work looks normal and he denies any symptoms that would warrant further investigation at the present time.  Reassess weight in 1 month.  If it continues to drop I would begin a work-up for malignancy that could be occult.  However I suspect the weight loss will stabilize specifically if he focuses on dehydration

## 2021-01-26 DIAGNOSIS — M25512 Pain in left shoulder: Secondary | ICD-10-CM | POA: Diagnosis not present

## 2021-01-28 DIAGNOSIS — M25562 Pain in left knee: Secondary | ICD-10-CM | POA: Diagnosis not present

## 2021-02-05 DIAGNOSIS — M25562 Pain in left knee: Secondary | ICD-10-CM | POA: Diagnosis not present

## 2021-02-09 ENCOUNTER — Other Ambulatory Visit: Payer: Self-pay | Admitting: Orthopaedic Surgery

## 2021-02-09 DIAGNOSIS — M25512 Pain in left shoulder: Secondary | ICD-10-CM

## 2021-02-10 ENCOUNTER — Other Ambulatory Visit: Payer: Self-pay | Admitting: *Deleted

## 2021-02-10 MED ORDER — METFORMIN HCL 1000 MG PO TABS: 1000.0000 mg | ORAL_TABLET | Freq: Two times a day (BID) | ORAL | 3 refills | Status: DC

## 2021-02-23 ENCOUNTER — Ambulatory Visit
Admission: RE | Admit: 2021-02-23 | Discharge: 2021-02-23 | Disposition: A | Discharge status: Home / Self Care | Payer: Medicare HMO | Source: Ambulatory Visit | Attending: Orthopaedic Surgery | Admitting: Orthopaedic Surgery

## 2021-02-23 DIAGNOSIS — Z01818 Encounter for other preprocedural examination: Secondary | ICD-10-CM | POA: Diagnosis not present

## 2021-02-23 DIAGNOSIS — M25512 Pain in left shoulder: Secondary | ICD-10-CM

## 2021-02-23 DIAGNOSIS — M24012 Loose body in left shoulder: Secondary | ICD-10-CM | POA: Diagnosis not present

## 2021-02-23 DIAGNOSIS — M19012 Primary osteoarthritis, left shoulder: Secondary | ICD-10-CM | POA: Diagnosis not present

## 2021-03-17 ENCOUNTER — Other Ambulatory Visit: Payer: Self-pay

## 2021-03-17 ENCOUNTER — Other Ambulatory Visit: Payer: Medicare HMO

## 2021-03-17 DIAGNOSIS — E119 Type 2 diabetes mellitus without complications: Secondary | ICD-10-CM

## 2021-03-17 DIAGNOSIS — Z01818 Encounter for other preprocedural examination: Secondary | ICD-10-CM

## 2021-03-18 LAB — COMPLETE METABOLIC PANEL WITH GFR
AG Ratio: 1.8 (calc) (ref 1.0–2.5)
ALT: 14 U/L (ref 9–46)
AST: 18 U/L (ref 10–35)
Albumin: 4.4 g/dL (ref 3.6–5.1)
Alkaline phosphatase (APISO): 53 U/L (ref 35–144)
BUN: 20 mg/dL (ref 7–25)
CO2: 28 mmol/L (ref 20–32)
Calcium: 9.8 mg/dL (ref 8.6–10.3)
Chloride: 98 mmol/L (ref 98–110)
Creat: 0.78 mg/dL (ref 0.70–1.28)
Globulin: 2.4 g/dL (calc) (ref 1.9–3.7)
Glucose, Bld: 161 mg/dL — ABNORMAL HIGH (ref 65–99)
Potassium: 4.9 mmol/L (ref 3.5–5.3)
Sodium: 135 mmol/L (ref 135–146)
Total Bilirubin: 0.4 mg/dL (ref 0.2–1.2)
Total Protein: 6.8 g/dL (ref 6.1–8.1)
eGFR: 95 mL/min/{1.73_m2} (ref >=60)

## 2021-03-18 LAB — CBC WITH DIFFERENTIAL/PLATELET
Absolute Monocytes: 578 cells/uL (ref 200–950)
Basophils Absolute: 41 cells/uL (ref 0–200)
Basophils Relative: 0.7 %
Eosinophils Absolute: 230 cells/uL (ref 15–500)
Eosinophils Relative: 3.9 %
HCT: 42.2 % (ref 38.5–50.0)
Hemoglobin: 14 g/dL (ref 13.2–17.1)
Lymphs Abs: 1522 cells/uL (ref 850–3900)
MCH: 31 pg (ref 27.0–33.0)
MCHC: 33.2 g/dL (ref 32.0–36.0)
MCV: 93.6 fL (ref 80.0–100.0)
MPV: 10.4 fL (ref 7.5–12.5)
Monocytes Relative: 9.8 %
Neutro Abs: 3528 cells/uL (ref 1500–7800)
Neutrophils Relative %: 59.8 %
Platelets: 253 10*3/uL (ref 140–400)
RBC: 4.51 10*6/uL (ref 4.20–5.80)
RDW: 13.1 % (ref 11.0–15.0)
Total Lymphocyte: 25.8 %
WBC: 5.9 10*3/uL (ref 3.8–10.8)

## 2021-03-18 LAB — HEMOGLOBIN A1C
Hgb A1c MFr Bld: 7.1 % of total Hgb — ABNORMAL HIGH (ref <5.7)
Mean Plasma Glucose: 157 mg/dL
eAG (mmol/L): 8.7 mmol/L

## 2021-03-24 DIAGNOSIS — R791 Abnormal coagulation profile: Secondary | ICD-10-CM | POA: Diagnosis not present

## 2021-03-24 DIAGNOSIS — Z01812 Encounter for preprocedural laboratory examination: Secondary | ICD-10-CM | POA: Diagnosis not present

## 2021-03-24 DIAGNOSIS — M19012 Primary osteoarthritis, left shoulder: Secondary | ICD-10-CM | POA: Diagnosis not present

## 2021-03-31 ENCOUNTER — Encounter: Payer: Self-pay | Admitting: Family Medicine

## 2021-03-31 DIAGNOSIS — H04123 Dry eye syndrome of bilateral lacrimal glands: Secondary | ICD-10-CM | POA: Diagnosis not present

## 2021-03-31 DIAGNOSIS — H11043 Peripheral pterygium, stationary, bilateral: Secondary | ICD-10-CM | POA: Diagnosis not present

## 2021-03-31 DIAGNOSIS — H25813 Combined forms of age-related cataract, bilateral: Secondary | ICD-10-CM | POA: Diagnosis not present

## 2021-03-31 DIAGNOSIS — D3131 Benign neoplasm of right choroid: Secondary | ICD-10-CM | POA: Diagnosis not present

## 2021-03-31 DIAGNOSIS — D3132 Benign neoplasm of left choroid: Secondary | ICD-10-CM | POA: Diagnosis not present

## 2021-03-31 DIAGNOSIS — E119 Type 2 diabetes mellitus without complications: Secondary | ICD-10-CM | POA: Diagnosis not present

## 2021-03-31 DIAGNOSIS — H0220C Unspecified lagophthalmos, bilateral, upper and lower eyelids: Secondary | ICD-10-CM | POA: Diagnosis not present

## 2021-03-31 DIAGNOSIS — D3130 Benign neoplasm of unspecified choroid: Secondary | ICD-10-CM | POA: Diagnosis not present

## 2021-03-31 DIAGNOSIS — H10413 Chronic giant papillary conjunctivitis, bilateral: Secondary | ICD-10-CM | POA: Diagnosis not present

## 2021-03-31 DIAGNOSIS — H35413 Lattice degeneration of retina, bilateral: Secondary | ICD-10-CM | POA: Diagnosis not present

## 2021-03-31 LAB — HM DIABETES EYE EXAM

## 2021-04-06 ENCOUNTER — Telehealth: Payer: Self-pay

## 2021-04-08 DIAGNOSIS — G8918 Other acute postprocedural pain: Secondary | ICD-10-CM | POA: Diagnosis not present

## 2021-04-08 DIAGNOSIS — M1712 Unilateral primary osteoarthritis, left knee: Secondary | ICD-10-CM | POA: Diagnosis not present

## 2021-04-10 DIAGNOSIS — Z96652 Presence of left artificial knee joint: Secondary | ICD-10-CM | POA: Diagnosis not present

## 2021-04-10 DIAGNOSIS — M25662 Stiffness of left knee, not elsewhere classified: Secondary | ICD-10-CM | POA: Diagnosis not present

## 2021-04-10 DIAGNOSIS — M6281 Muscle weakness (generalized): Secondary | ICD-10-CM | POA: Diagnosis not present

## 2021-04-10 DIAGNOSIS — M1712 Unilateral primary osteoarthritis, left knee: Secondary | ICD-10-CM | POA: Diagnosis not present

## 2021-04-10 DIAGNOSIS — M25562 Pain in left knee: Secondary | ICD-10-CM | POA: Diagnosis not present

## 2021-04-13 DIAGNOSIS — M25562 Pain in left knee: Secondary | ICD-10-CM | POA: Diagnosis not present

## 2021-04-13 DIAGNOSIS — M1712 Unilateral primary osteoarthritis, left knee: Secondary | ICD-10-CM | POA: Diagnosis not present

## 2021-04-13 DIAGNOSIS — M25662 Stiffness of left knee, not elsewhere classified: Secondary | ICD-10-CM | POA: Diagnosis not present

## 2021-04-13 DIAGNOSIS — M6281 Muscle weakness (generalized): Secondary | ICD-10-CM | POA: Diagnosis not present

## 2021-04-17 DIAGNOSIS — M6281 Muscle weakness (generalized): Secondary | ICD-10-CM | POA: Diagnosis not present

## 2021-04-17 DIAGNOSIS — M25562 Pain in left knee: Secondary | ICD-10-CM | POA: Diagnosis not present

## 2021-04-17 DIAGNOSIS — M1712 Unilateral primary osteoarthritis, left knee: Secondary | ICD-10-CM | POA: Diagnosis not present

## 2021-04-17 DIAGNOSIS — M25662 Stiffness of left knee, not elsewhere classified: Secondary | ICD-10-CM | POA: Diagnosis not present

## 2021-04-20 ENCOUNTER — Ambulatory Visit (INDEPENDENT_AMBULATORY_CARE_PROVIDER_SITE_OTHER): Payer: Medicare HMO | Admitting: Family Medicine

## 2021-04-20 ENCOUNTER — Other Ambulatory Visit: Payer: Self-pay

## 2021-04-20 ENCOUNTER — Encounter: Payer: Self-pay | Admitting: Family Medicine

## 2021-04-20 VITALS — BP 130/64 | HR 102 | Temp 98.2°F | Resp 18 | Ht 70.0 in | Wt 151.0 lb

## 2021-04-20 DIAGNOSIS — R634 Abnormal weight loss: Secondary | ICD-10-CM | POA: Diagnosis not present

## 2021-04-20 DIAGNOSIS — M25662 Stiffness of left knee, not elsewhere classified: Secondary | ICD-10-CM | POA: Diagnosis not present

## 2021-04-20 DIAGNOSIS — M6281 Muscle weakness (generalized): Secondary | ICD-10-CM | POA: Diagnosis not present

## 2021-04-20 DIAGNOSIS — M1712 Unilateral primary osteoarthritis, left knee: Secondary | ICD-10-CM | POA: Diagnosis not present

## 2021-04-20 DIAGNOSIS — R7309 Other abnormal glucose: Secondary | ICD-10-CM | POA: Diagnosis not present

## 2021-04-20 DIAGNOSIS — M25562 Pain in left knee: Secondary | ICD-10-CM | POA: Diagnosis not present

## 2021-04-20 MED ORDER — PIOGLITAZONE HCL 30 MG PO TABS: 30.0000 mg | ORAL_TABLET | Freq: Every day | ORAL | 11 refills | Status: DC

## 2021-04-20 NOTE — Progress Notes (Signed)
Subjective:    Patient ID: Todd Lawrence, male    DOB: 1950/07/18, 70 y.o.   MRN: [id number]  HPI Wt Readings from Last 3 Encounters:  04/20/21 151 lb (68.5 kg)  01/16/21 159 lb (72.1 kg)  08/19/20 166 lb (75.3 kg)   Patient has lost 15 pounds since February.  This is roughly the same time he started [place].  He denies any fevers chills body aches.  He denies any nausea or vomiting or diarrhea.  He denies any chest pain shortness of breath or dyspnea on exertion.  He denies any melena or hematochezia or hemoptysis.  He denies any hematuria.  There are no symptoms to suggest any underlying problem other than he has lost weight.  His last A1c in September was 7.1.  At that time I recommended adding Actos to try to get him to 6.5.  He felt like not doing that because he felt that 7.1 was good enough.  Immunization History  Administered Date(s) Administered   Influenza,inj,Quad PF,6+ Mos 04/04/2018   PFIZER(Purple Top)SARS-COV-2 Vaccination 08/06/2019, 08/27/2019   Pneumococcal Conjugate-13 11/16/2016   Pneumococcal Polysaccharide-23 01/31/2015   Tdap 09/25/2013, 08/09/2015   Zoster, Live 11/06/2013   Past Medical History:  Diagnosis Date   Acute meniscal tear of left knee    DDD (degenerative disc disease), lumbar    has received ESI x 3   Diabetes mellitus without complication (HCC)    GERD (gastroesophageal reflux disease)    History of blood transfusion    History of staph infection    after thumb surgery   Hyperlipidemia    Left rotator cuff tear    Primary localized osteoarthritis of left knee    Past Surgical History:  Procedure Laterality Date   CERVICAL FUSION     COLONOSCOPY     FOOT SURGERY Right    JOINT REPLACEMENT N/A    [employer] 12/02/2019   KNEE ARTHROSCOPY Bilateral    SHOULDER ARTHROSCOPY Left    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 30 tablet 3   ketotifen (ZADITOR) 0.025 % ophthalmic solution Place 1 drop into both eyes daily as needed (itching).     metFORMIN (GLUCOPHAGE) 1000 MG tablet Take 1 tablet (1,000 mg total) by mouth 2 (two) times daily with a meal. 180 tablet 3   Multiple Vitamin (MULTIVITAMIN WITH MINERALS) TABS tablet Take 1 tablet by mouth daily.     omeprazole (PRILOSEC) 40 MG capsule Take 1 capsule (40 mg total) by mouth daily. (Patient taking differently: Take 40 mg by mouth daily as needed.) 30 capsule 11   Propylene Glycol (SYSTANE BALANCE) 0.6 % SOLN Place 1 drop into both eyes daily.     No current facility-administered medications on file prior to visit.   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  Other Topics Concern   Not on file  Social History Narrative   Not on file   Social Determinants of Health   Financial Resource Strain: Low Risk    Difficulty of Paying Living Expenses: Not hard at all  Food Insecurity: No Food Insecurity   Worried About Charity fundraiser in the Last  Year: Never true   Arboriculturist in the Last Year: Never true  Transportation Needs: No Transportation Needs   Lack of Transportation (Medical): No   Lack of Transportation (Non-Medical): No  Physical Activity: Inactive   Days of Exercise per Week: 0 days   Minutes of Exercise per Session: 0 min  Stress: No Stress Concern Present   Feeling of Stress : Not at all  Social Connections: Moderately Integrated   Frequency of Communication with Friends and Family: Three times a week   Frequency of Social Gatherings with Friends and Family: More than three times a week   Attends Religious Services: More than 4 times per year   Active Member of [employer] or Organizations: No   Attends Archivist Meetings: Never   Marital Status: Married  Human resources officer Violence: Not At Risk   Fear of Current or Ex-Partner: No   Emotionally Abused: No   Physically Abused: No   Sexually Abused: No   Family History  Problem Relation Age of Onset   Heart disease Father    Asthma Sister    Arthritis Sister      Review of Systems     Objective:   Physical Exam Vitals reviewed.  Constitutional:      General: He is not in acute distress.    Appearance: Normal appearance. He is normal weight.  Neck:     Vascular: No carotid bruit.  Cardiovascular:     Rate and Rhythm: Normal rate and regular rhythm.     Pulses: Normal pulses.     Heart sounds: Normal heart sounds. No murmur heard. Pulmonary:     Effort: Pulmonary effort is normal. No respiratory distress.     Breath sounds: Normal breath sounds. No wheezing, rhonchi or rales.  Abdominal:     General: Abdomen is flat. Bowel sounds are normal. There is no distension.     Palpations: Abdomen is soft.     Tenderness: There is no abdominal tenderness. There is no guarding.  Musculoskeletal:     Cervical back: Neck supple.  Lymphadenopathy:     Cervical: No cervical adenopathy.  Skin:    Coloration: Skin is not jaundiced.     Findings: No  bruising, lesion or rash.  Neurological:     General: No focal deficit present.     Mental Status: He is alert and oriented to person, place, and time.     Cranial Nerves: No cranial nerve deficit.     Motor: No weakness.     Gait: Gait normal.  Assessment & Plan:  Weight loss - Plan: CBC with Differential/Platelet, COMPLETE METABOLIC PANEL WITH GFR, Hemoglobin A1c, Lipase I have asked the patient to stop Jardiance.  Continue metformin and replace Jardiance with Actos 30 mg a day.  I believe the Vania Rea is causing the weight loss.  I will check CBC CMP A1c and lipase.  Consider imaging given unintentional weight loss particularly if the lab work is abnormal

## 2021-04-21 ENCOUNTER — Other Ambulatory Visit: Payer: Self-pay | Admitting: Family Medicine

## 2021-04-21 DIAGNOSIS — R634 Abnormal weight loss: Secondary | ICD-10-CM

## 2021-04-21 LAB — CBC WITH DIFFERENTIAL/PLATELET
Absolute Monocytes: 755 cells/uL (ref 200–950)
Basophils Absolute: 51 cells/uL (ref 0–200)
Basophils Relative: 0.5 %
Eosinophils Absolute: 245 cells/uL (ref 15–500)
Eosinophils Relative: 2.4 %
HCT: 37.6 % — ABNORMAL LOW (ref 38.5–50.0)
Hemoglobin: 12.4 g/dL — ABNORMAL LOW (ref 13.2–17.1)
Lymphs Abs: 1224 cells/uL (ref 850–3900)
MCH: 31.3 pg (ref 27.0–33.0)
MCHC: 33 g/dL (ref 32.0–36.0)
MCV: 94.9 fL (ref 80.0–100.0)
MPV: 10.2 fL (ref 7.5–12.5)
Monocytes Relative: 7.4 %
Neutro Abs: 7925 cells/uL — ABNORMAL HIGH (ref 1500–7800)
Neutrophils Relative %: 77.7 %
Platelets: 497 10*3/uL — ABNORMAL HIGH (ref 140–400)
RBC: 3.96 10*6/uL — ABNORMAL LOW (ref 4.20–5.80)
RDW: 12.5 % (ref 11.0–15.0)
Total Lymphocyte: 12 %
WBC: 10.2 10*3/uL (ref 3.8–10.8)

## 2021-04-21 LAB — COMPLETE METABOLIC PANEL WITH GFR
AG Ratio: 1.8 (calc) (ref 1.0–2.5)
ALT: 13 U/L (ref 9–46)
AST: 20 U/L (ref 10–35)
Albumin: 4.4 g/dL (ref 3.6–5.1)
Alkaline phosphatase (APISO): 73 U/L (ref 35–144)
BUN: 15 mg/dL (ref 7–25)
CO2: 29 mmol/L (ref 20–32)
Calcium: 9.7 mg/dL (ref 8.6–10.3)
Chloride: 97 mmol/L — ABNORMAL LOW (ref 98–110)
Creat: 0.89 mg/dL (ref 0.70–1.28)
Globulin: 2.4 g/dL (calc) (ref 1.9–3.7)
Glucose, Bld: 196 mg/dL — ABNORMAL HIGH (ref 65–99)
Potassium: 4.9 mmol/L (ref 3.5–5.3)
Sodium: 136 mmol/L (ref 135–146)
Total Bilirubin: 0.7 mg/dL (ref 0.2–1.2)
Total Protein: 6.8 g/dL (ref 6.1–8.1)
eGFR: 91 mL/min/{1.73_m2} (ref >=60)

## 2021-04-21 LAB — HEMOGLOBIN A1C
Hgb A1c MFr Bld: 6.8 % of total Hgb — ABNORMAL HIGH (ref <5.7)
Mean Plasma Glucose: 148 mg/dL
eAG (mmol/L): 8.2 mmol/L

## 2021-04-21 LAB — LIPASE: Lipase: 8 U/L (ref 7–60)

## 2021-04-22 NOTE — Progress Notes (Signed)
Chronic Care Management Pharmacy Note  04/30/2021 Name:  Todd Lawrence MRN:  [id number] DOB:  03-Jul-1951  Subjective: Todd Lawrence is an 70 y.o. year old male who is a primary patient of Pickard, Cammie Mcgee, MD.  The CCM team was consulted for assistance with disease management and care coordination needs.    Engaged with patient by telephone for follow up visit in response to provider referral for pharmacy case management and/or care coordination services.   Consent to Services:  The patient was given the following information about Chronic Care Management services today, agreed to services, and gave verbal consent: 1. CCM service includes personalized support from designated clinical staff supervised by the primary care provider, including individualized plan of care and coordination with other care providers 2. 24/7 contact phone numbers for assistance for urgent and routine care needs. 3. Service will only be billed when office clinical staff spend 20 minutes or more in a month to coordinate care. 4. Only one practitioner may furnish and bill the service in a calendar month. 5.The patient may stop CCM services at any time (effective at the end of the month) by phone call to the office staff. 6. The patient will be responsible for cost sharing (co-pay) of up to 20% of the service fee (after annual deductible is met). Patient agreed to services and consent obtained.  Patient Care Team: [name], MD as PCP - General (Family Medicine) Edythe Clarity, [facility] as Pharmacist (Pharmacist)  Recent office visits: 04/20/21 Dennard Schaumann) - unintentional weight loss since he started Jardiance, directed to stop Jardiance and start pioglitazone.  Also ordered CT of abdomen and pelvis due to unexplained weight loss.  This was scheduled for 05/06/21. 08/19/20 Dr. Dennard Schaumann.For Bronchitis. STARTED Albuterol Sulfate 108 (90 Base) MCG/ACT 2 puffs inhalation every 6 hours PRN, Doxycycline Hyclate 100 mg 2 times  daily, and Prednisone 20 mg pack  Recent consult visits: None in previous 6 months  Hospital visits: None in previous 6 months  Medication History: Jardiance 25 mg 30 DS 10/31/20 Atorvastatin 20 mg 90 DS 09/21/20 Metformin 500 mg 90 DS 06/04/20  Objective:  Lab Results  Component Value Date   CREATININE 0.89 04/20/2021   BUN 15 04/20/2021   GFRNONAA 87 01/02/2021   GFRAA 101 01/02/2021   NA 136 04/20/2021   K 4.9 04/20/2021   CALCIUM 9.7 04/20/2021   CO2 29 04/20/2021   GLUCOSE 196 (H) 04/20/2021    Lab Results  Component Value Date/Time   HGBA1C 6.8 (H) 04/20/2021 12:57 PM   HGBA1C 7.1 (H) 03/17/2021 09:22 AM   MICROALBUR <0.2 01/02/2021 09:34 AM   MICROALBUR 0.3 12/04/2019 09:04 AM    Last diabetic Eye exam:  Lab Results  Component Value Date/Time   HMDIABEYEEXA No Retinopathy 03/28/2019 12:00 AM    Last diabetic Foot exam: No results found for: HMDIABFOOTEX   Lab Results  Component Value Date   CHOL 188 01/02/2021   HDL 78 01/02/2021   LDLCALC 97 01/02/2021   TRIG 43 01/02/2021   CHOLHDL 2.4 01/02/2021    Hepatic Function Latest Ref Rng & Units 04/20/2021 03/17/2021 01/02/2021  Total Protein 6.1 - 8.1 g/dL 6.8 6.8 6.7  Albumin 3.5 - 5.0 g/dL - - -  AST 10 - 35 U/L _0 ALT 9 - 46 U/L _1 Alk Phosphatase 38 - 126 U/L - - -  Total Bilirubin 0.2 - 1.2 mg/dL 0.7 0.4 0.4    Lab  Results  Component Value Date/Time   TSH 1.32 01/02/2021 09:28 AM   TSH 1.47 11/02/2016 08:50 AM    CBC Latest Ref Rng & Units 04/20/2021 03/17/2021 01/02/2021  WBC 3.8 - 10.8 Thousand/uL 10.2 5.9 6.3  Hemoglobin 13.2 - 17.1 g/dL 12.4(L) 14.0 14.5  Hematocrit 38.5 - 50.0 % 37.6(L) 42.2 44.9  Platelets 140 - 400 Thousand/uL 497(H) 253 232    Lab Results  Component Value Date/Time   VD25OH 42 09/21/2013 10:52 AM    Clinical ASCVD: No  The 10-year ASCVD risk score (Arnett DK, et al., 2019) is: 30.8%   Values used to calculate the score:     Age: 70 years     Sex:  Male     Is Non-Hispanic African American: No     Diabetic: Yes     Tobacco smoker: No     Systolic Blood Pressure: 130 mmHg     Is BP treated: No     HDL Cholesterol: 78 mg/dL     Total Cholesterol: 188 mg/dL    Depression screen PHQ 2/9 12/12/2020 12/04/2019 04/04/2018  Decreased Interest 0 0 0  Down, Depressed, Hopeless 0 0 0  PHQ - 2 Score 0 0 0  Altered sleeping - - -  Tired, decreased energy - - -  Change in appetite - - -  Feeling bad or failure about yourself  - - -  Trouble concentrating - - -  Moving slowly or fidgety/restless - - -  PHQ-9 Score - - -  Difficult doing work/chores - - -     Social History   Tobacco Use  Smoking Status Never  Smokeless Tobacco Never   BP Readings from Last 3 Encounters:  04/20/21 130/64  01/16/21 134/68  08/19/20 136/70   Pulse Readings from Last 3 Encounters:  04/20/21 (!) 102  01/16/21 84  08/19/20 87   Wt Readings from Last 3 Encounters:  04/20/21 151 lb (68.5 kg)  01/16/21 159 lb (72.1 kg)  08/19/20 166 lb (75.3 kg)   BMI Readings from Last 3 Encounters:  04/20/21 21.67 kg/m  01/16/21 22.81 kg/m  08/19/20 23.82 kg/m    Assessment/Interventions: Review of patient past medical history, allergies, medications, health status, including review of consultants reports, laboratory and other test data, was performed as part of comprehensive evaluation and provision of chronic care management services.   SDOH:  (Social Determinants of Health) assessments and interventions performed: Yes   Financial Resource Strain: Low Risk    Difficulty of Paying Living Expenses: Not hard at all    SDOH Screenings   Alcohol Screen: Low Risk    Last Alcohol Screening Score (AUDIT): 1  Depression (PHQ2-9): Low Risk    PHQ-2 Score: 0  Financial Resource Strain: Low Risk    Difficulty of Paying Living Expenses: Not hard at all  Food Insecurity: No Food Insecurity   Worried About Running Out of Food in the Last Year: Never true   Ran  Out of Food in the Last Year: Never true  Housing: Low Risk    Last Housing Risk Score: 0  Physical Activity: Inactive   Days of Exercise per Week: 0 days   Minutes of Exercise per Session: 0 min  Social Connections: Moderately Integrated   Frequency of Communication with Friends and Family: Three times a week   Frequency of Social Gatherings with Friends and Family: More than three times a week   Attends Religious Services: More than 4 times per year     Active Member of Clubs or Organizations: No   Attends Archivist Meetings: Never   Marital Status: Married  Stress: No Stress Concern Present   Feeling of Stress : Not at all  Tobacco Use: Low Risk    Smoking Tobacco Use: Never   Smokeless Tobacco Use: Never   Passive Exposure: Not on file  Transportation Needs: No Transportation Needs   Lack of Transportation (Medical): No   Lack of Transportation (Non-Medical): No    CCM Care Plan  No Known Allergies  Medications Reviewed Today     Reviewed by Edythe Clarity, [facility] (Pharmacist) on 04/30/21 at 0847  Med List Status: <None>   Medication Order Taking? Sig Documenting Provider Last Dose Status Informant  aspirin EC 325 MG EC tablet [id number] Yes 1 tab a day for the next 30 days to prevent blood clots Shepperson, Kirstin, PA-C Taking Active   atorvastatin (LIPITOR) 20 MG tablet [id number] Yes TAKE 1 TABLET BY MOUTH EVERY DAY [name], MD Taking Active   JARDIANCE 25 MG TABS tablet [id number] Yes TAKE 1 TABLET BY MOUTH DAILY BEFORE BREAKFAST. [name], MD Taking Active   ketotifen (ZADITOR) 0.025 % ophthalmic solution [id number] Yes Place 1 drop into both eyes daily as needed (itching). [provider] Taking Active Self  metFORMIN (GLUCOPHAGE) 1000 MG tablet [id number] Yes Take 1 tablet (1,000 mg total) by mouth 2 (two) times daily with a meal. [name], MD Taking Active   Multiple Vitamin (MULTIVITAMIN WITH MINERALS) TABS tablet [id number]  Yes Take 1 tablet by mouth daily. [provider] Taking Active Self  omeprazole (PRILOSEC) 40 MG capsule [id number] Yes TAKE 1 CAPSULE BY MOUTH EVERY DAY [name], MD Taking Active   pioglitazone (ACTOS) 30 MG tablet [id number] Yes Take 1 tablet (30 mg total) by mouth daily. [name], MD Taking Active   Propylene Glycol (SYSTANE BALANCE) 0.6 % SOLN [id number] Yes Place 1 drop into both eyes daily. 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   Amputation of left thumb 08/09/2015   Prediabetes     Immunization History  Administered Date(s) Administered   Influenza,inj,Quad PF,6+ Mos 04/04/2018   PFIZER(Purple Top)SARS-COV-2 Vaccination 08/06/2019, 08/27/2019   Pneumococcal Conjugate-13 11/16/2016   Pneumococcal Polysaccharide-23 01/31/2015   Tdap 09/25/2013, 08/09/2015   Zoster, Live 11/06/2013    Conditions to be addressed/monitored:  DM, Osteoarthritis, DDD, HLD  Care Plan : General Pharmacy (Adult)  Updates made by Edythe Clarity, RPH since 04/30/2021 12:00 AM     Problem: DM, Osteoarthritis, DDD, HLD   Priority: High  Onset Date: 12/01/2020     Long-Range Goal: Patient-Specific Goal   Start Date: 12/01/2020  Expected End Date: 06/03/2021  Recent Progress: On track  Priority: High  Note:   Current Barriers:  Unable to achieve control of blood glucose   Pharmacist Clinical Goal(s):  Patient will achieve control of glucose as evidenced by A1c adhere to plan to optimize therapeutic regimen for DM as evidenced by report of adherence to recommended medication management changes adhere to prescribed medication  regimen as evidenced by fill  dates contact provider office for questions/concerns as evidenced notation of same in electronic health record through collaboration with PharmD and provider.   Interventions: 1:1 collaboration with Pickard, Warren T, MD regarding development and update of comprehensive plan of care as evidenced by provider attestation and co-signature Inter-disciplinary care team collaboration (see longitudinal plan of care) Comprehensive medication review performed; medication list updated in electronic medical record  Hyperlipidemia: (LDL goal < 70) -Not ideally controlled -Current treatment: Atorvastatin 20mg daily -Medications previously tried: none noted  -Current dietary patterns: working on cutting back on sugars, denies any regular sodas, does admit to occasional ice cream or milkshake -Current exercise habits: very active mowing yards etc. -Educated on Cholesterol goals;  Benefits of statin for ASCVD risk reduction; Importance of limiting foods high in cholesterol; -Recommended to continue current medication  Update 04/30/21 ASCVD Risk 30.8% has patient in high risk for CV event.  Currently on moderate intensity statin.  Would recommend to repeat lipid panel.  If LDL is still on the high side would recommend increase to high intensity statin if patient is open to the idea and tolerating current dose well.  Continue meds for now.  Diabetes (A1c goal <7%) -Not ideally controlled -Current medications: Metormin 1,000mg BID with a meal Pioglitazone 30mg daily -Medications previously tried: Metformin (d/c)  -Current home glucose readings fasting glucose: not checking post prandial glucose: not checking -Denies hypoglycemic/hyperglycemic symptoms -Current meal patterns:  breakfast: cheerios  lunch: sandwich  dinner: home cooked meal snacks: almonds, cheez-itz drinks: diet cheerwine, propel packets, water -Current exercise: see above -Educated on A1c and blood  sugar goals; Prevention and management of hypoglycemic episodes; Carbohydrate counting and/or plate method -Counseled to check feet daily and get yearly eye exams -Recommended to continue current medication Recommended continuing to cut back on carbohydrates and limit to one source per meal.  Recommend repeat lipid panel. If A1c still elevated at that time would recommend adding back Metformin 500mg daily to Jardiance.  Assessed patient finances, I believe he would qualify for PAP with Jardiance.  Application provided, patient will fill out their part and return for completion.  Update 04/30/21 He was denied by the Jardiance PAP program due to income.  However, since he has been taken off this medication to see if it resolves his rapid weight loss.  Was started in [place] to replace.  Has not been checking sugars at home.  No changes to diet.  Plans to have A1c rechecked in January 2023.  Encouraged him to continue to work on diet/exercise. No issues with cost of medications currently.  Osteoarthritis/DDD (Goal: Minimize symptoms) -Controlled -Current treatment  IBU prn - not taking very often -Medications previously tried: none noted  -Recommended to continue current medication   Patient Goals/Self-Care Activities Patient will:  - take medications as prescribed focus on medication adherence by using pill box collaborate with provider on medication access solutions  Follow Up Plan: The care management team will reach out to the patient again over the next 120 days.             Medication Assistance: Application for Jardiance  medication assistance program. in process.  Anticipated assistance start date unknown.  See plan of care for additional detail.  Patient's preferred pharmacy is:  [id number] - [place], [place] - [address] [place] [address] Phone: [phone] Fax: [phone]  Uses pill box? Yes - wife  organizes Pt endorses 100% compliance  We   discussed: Benefits of medication synchronization, packaging and delivery as well as enhanced pharmacist oversight with [employer]. Patient decided to: Continue current medication management strategy  Care Plan and Follow Up Patient Decision:  Patient agrees to Care Plan and Follow-up.  Plan: The care management team will reach out to the patient again over the next 120 days.  Beverly Milch, PharmD Clinical Pharmacist [place] [phone]

## 2021-04-24 DIAGNOSIS — M6281 Muscle weakness (generalized): Secondary | ICD-10-CM | POA: Diagnosis not present

## 2021-04-24 DIAGNOSIS — M25562 Pain in left knee: Secondary | ICD-10-CM | POA: Diagnosis not present

## 2021-04-24 DIAGNOSIS — M25662 Stiffness of left knee, not elsewhere classified: Secondary | ICD-10-CM | POA: Diagnosis not present

## 2021-04-24 DIAGNOSIS — M1712 Unilateral primary osteoarthritis, left knee: Secondary | ICD-10-CM | POA: Diagnosis not present

## 2021-04-25 ENCOUNTER — Other Ambulatory Visit: Payer: Self-pay | Admitting: Family Medicine

## 2021-04-27 ENCOUNTER — Other Ambulatory Visit: Payer: Self-pay | Admitting: Family Medicine

## 2021-04-27 DIAGNOSIS — M1712 Unilateral primary osteoarthritis, left knee: Secondary | ICD-10-CM | POA: Diagnosis not present

## 2021-04-27 DIAGNOSIS — M25662 Stiffness of left knee, not elsewhere classified: Secondary | ICD-10-CM | POA: Diagnosis not present

## 2021-04-27 DIAGNOSIS — E119 Type 2 diabetes mellitus without complications: Secondary | ICD-10-CM

## 2021-04-27 DIAGNOSIS — M25562 Pain in left knee: Secondary | ICD-10-CM | POA: Diagnosis not present

## 2021-04-27 DIAGNOSIS — M6281 Muscle weakness (generalized): Secondary | ICD-10-CM | POA: Diagnosis not present

## 2021-04-30 ENCOUNTER — Ambulatory Visit (INDEPENDENT_AMBULATORY_CARE_PROVIDER_SITE_OTHER): Payer: Medicare HMO | Admitting: Pharmacist

## 2021-04-30 DIAGNOSIS — E119 Type 2 diabetes mellitus without complications: Secondary | ICD-10-CM

## 2021-04-30 DIAGNOSIS — E7800 Pure hypercholesterolemia, unspecified: Secondary | ICD-10-CM

## 2021-04-30 NOTE — Patient Instructions (Addendum)
Visit Information   Goals Addressed             This Visit's Progress    Set My Target A1C-Diabetes Type 2   On track    Timeframe:  Long-Range Goal Priority:  High Start Date:     12/01/20                        Expected End Date:     06/03/21                  Follow Up Date 03/03/21   - set target A1C    Why is this important?   Your target A1C is decided together by you and your doctor.  It is based on several things like your age and other health issues.    Notes: < 7.0 at next visit!       Patient Care Plan: General Pharmacy (Adult)     Problem Identified: DM, Osteoarthritis, DDD, HLD   Priority: High  Onset Date: 12/01/2020     Long-Range Goal: Patient-Specific Goal   Start Date: 12/01/2020  Expected End Date: 06/03/2021  Recent Progress: On track  Priority: High  Note:   Current Barriers:  Unable to achieve control of blood glucose   Pharmacist Clinical Goal(s):  Patient will achieve control of glucose as evidenced by A1c adhere to plan to optimize therapeutic regimen for DM as evidenced by report of adherence to recommended medication management changes adhere to prescribed medication regimen as evidenced by fill dates contact provider office for questions/concerns as evidenced notation of same in electronic health record through collaboration with PharmD and provider.   Interventions: 1:1 collaboration with [name], MD regarding development and update of comprehensive plan of care as evidenced by provider attestation and co-signature Inter-disciplinary care team collaboration (see longitudinal plan of care) Comprehensive medication review performed; medication list updated in electronic medical record  Hyperlipidemia: (LDL goal < 70) -Not ideally controlled -Current treatment: Atorvastatin 20mg  daily -Medications previously tried: none noted  -Current dietary patterns: working on cutting back on sugars, denies any regular sodas, does admit  to occasional ice cream or milkshake -Current exercise habits: very active mowing yards etc. -Educated on Cholesterol goals;  Benefits of statin for ASCVD risk reduction; Importance of limiting foods high in cholesterol; -Recommended to continue current medication  Update 04/30/21 ASCVD Risk 30.8% has patient in high risk for CV event.  Currently on moderate intensity statin.  Would recommend to repeat lipid panel.  If LDL is still on the high side would recommend increase to high intensity statin if patient is open to the idea and tolerating current dose well.  Continue meds for now.  Diabetes (A1c goal <7%) -Not ideally controlled -Current medications: Metormin 1,000mg  BID with a meal Pioglitazone 30mg  daily -Medications previously tried: Metformin (d/c)  -Current home glucose readings fasting glucose: not checking post prandial glucose: not checking -Denies hypoglycemic/hyperglycemic symptoms -Current meal patterns:  breakfast: cheerios  lunch: sandwich  dinner: home cooked meal snacks: almonds, cheez-itz drinks: diet cheerwine, propel packets, water -Current exercise: see above -Educated on A1c and blood sugar goals; Prevention and management of hypoglycemic episodes; Carbohydrate counting and/or plate method -Counseled to check feet daily and get yearly eye exams -Recommended to continue current medication Recommended continuing to cut back on carbohydrates and limit to one source per meal.  Recommend repeat lipid panel. If A1c still elevated at that time would recommend adding back Metformin  500mg  daily to Jardiance.  Assessed patient finances, I believe he would qualify for PAP with Jardiance.  Application provided, patient will fill out their part and return for completion.  Update 04/30/21 He was denied by the Jardiance PAP program due to income.  However, since he has been taken off this medication to see if it resolves his rapid weight loss.  Was started in [place] to  replace.  Has not been checking sugars at home.  No changes to diet.  Plans to have A1c rechecked in January 2023.  Encouraged him to continue to work on diet/exercise. No issues with cost of medications currently.  Osteoarthritis/DDD (Goal: Minimize symptoms) -Controlled -Current treatment  IBU prn - not taking very often -Medications previously tried: none noted  -Recommended to continue current medication   Patient Goals/Self-Care Activities Patient will:  - take medications as prescribed focus on medication adherence by using pill box collaborate with provider on medication access solutions  Follow Up Plan: The care management team will reach out to the patient again over the next 120 days.            Patient verbalizes understanding of instructions provided today and agrees to view in [place].  Telephone follow up appointment with pharmacy team member scheduled for: 6 months  Edythe Clarity, [place]

## 2021-05-01 DIAGNOSIS — M6281 Muscle weakness (generalized): Secondary | ICD-10-CM | POA: Diagnosis not present

## 2021-05-01 DIAGNOSIS — M25562 Pain in left knee: Secondary | ICD-10-CM | POA: Diagnosis not present

## 2021-05-01 DIAGNOSIS — M25662 Stiffness of left knee, not elsewhere classified: Secondary | ICD-10-CM | POA: Diagnosis not present

## 2021-05-01 DIAGNOSIS — M1712 Unilateral primary osteoarthritis, left knee: Secondary | ICD-10-CM | POA: Diagnosis not present

## 2021-05-04 DIAGNOSIS — M25662 Stiffness of left knee, not elsewhere classified: Secondary | ICD-10-CM | POA: Diagnosis not present

## 2021-05-04 DIAGNOSIS — M25562 Pain in left knee: Secondary | ICD-10-CM | POA: Diagnosis not present

## 2021-05-04 DIAGNOSIS — M6281 Muscle weakness (generalized): Secondary | ICD-10-CM | POA: Diagnosis not present

## 2021-05-04 DIAGNOSIS — M1712 Unilateral primary osteoarthritis, left knee: Secondary | ICD-10-CM | POA: Diagnosis not present

## 2021-05-05 DIAGNOSIS — M1712 Unilateral primary osteoarthritis, left knee: Secondary | ICD-10-CM | POA: Diagnosis not present

## 2021-05-06 ENCOUNTER — Ambulatory Visit
Admission: RE | Admit: 2021-05-06 | Discharge: 2021-05-06 | Disposition: A | Discharge status: Home / Self Care | Payer: Medicare HMO | Source: Ambulatory Visit | Attending: Family Medicine | Admitting: Family Medicine

## 2021-05-06 DIAGNOSIS — K8689 Other specified diseases of pancreas: Secondary | ICD-10-CM | POA: Diagnosis not present

## 2021-05-06 DIAGNOSIS — R634 Abnormal weight loss: Secondary | ICD-10-CM | POA: Diagnosis not present

## 2021-05-06 DIAGNOSIS — N2889 Other specified disorders of kidney and ureter: Secondary | ICD-10-CM | POA: Diagnosis not present

## 2021-05-06 DIAGNOSIS — N281 Cyst of kidney, acquired: Secondary | ICD-10-CM | POA: Diagnosis not present

## 2021-05-06 MED ORDER — IOPAMIDOL (ISOVUE-300) INJECTION 61%
100.0000 mL | Freq: Once | INTRAVENOUS | Status: AC | PRN
  Administered 2021-05-06: 100 mL via INTRAVENOUS

## 2021-05-08 DIAGNOSIS — M6282 Rhabdomyolysis: Secondary | ICD-10-CM | POA: Diagnosis not present

## 2021-05-08 DIAGNOSIS — M25562 Pain in left knee: Secondary | ICD-10-CM | POA: Diagnosis not present

## 2021-05-08 DIAGNOSIS — M25662 Stiffness of left knee, not elsewhere classified: Secondary | ICD-10-CM | POA: Diagnosis not present

## 2021-05-08 DIAGNOSIS — M1712 Unilateral primary osteoarthritis, left knee: Secondary | ICD-10-CM | POA: Diagnosis not present

## 2021-05-11 DIAGNOSIS — M6281 Muscle weakness (generalized): Secondary | ICD-10-CM | POA: Diagnosis not present

## 2021-05-11 DIAGNOSIS — E119 Type 2 diabetes mellitus without complications: Secondary | ICD-10-CM

## 2021-05-11 DIAGNOSIS — M1712 Unilateral primary osteoarthritis, left knee: Secondary | ICD-10-CM | POA: Diagnosis not present

## 2021-05-11 DIAGNOSIS — E7800 Pure hypercholesterolemia, unspecified: Secondary | ICD-10-CM | POA: Diagnosis not present

## 2021-05-11 DIAGNOSIS — M25662 Stiffness of left knee, not elsewhere classified: Secondary | ICD-10-CM | POA: Diagnosis not present

## 2021-05-11 DIAGNOSIS — M25562 Pain in left knee: Secondary | ICD-10-CM | POA: Diagnosis not present

## 2021-05-11 DIAGNOSIS — Z96652 Presence of left artificial knee joint: Secondary | ICD-10-CM | POA: Diagnosis not present

## 2021-05-14 DIAGNOSIS — Z791 Long term (current) use of non-steroidal anti-inflammatories (NSAID): Secondary | ICD-10-CM | POA: Diagnosis not present

## 2021-05-14 DIAGNOSIS — Z8249 Family history of ischemic heart disease and other diseases of the circulatory system: Secondary | ICD-10-CM | POA: Diagnosis not present

## 2021-05-14 DIAGNOSIS — K219 Gastro-esophageal reflux disease without esophagitis: Secondary | ICD-10-CM | POA: Diagnosis not present

## 2021-05-14 DIAGNOSIS — Z89012 Acquired absence of left thumb: Secondary | ICD-10-CM | POA: Diagnosis not present

## 2021-05-14 DIAGNOSIS — J449 Chronic obstructive pulmonary disease, unspecified: Secondary | ICD-10-CM | POA: Diagnosis not present

## 2021-05-14 DIAGNOSIS — N529 Male erectile dysfunction, unspecified: Secondary | ICD-10-CM | POA: Diagnosis not present

## 2021-05-14 DIAGNOSIS — M1990 Unspecified osteoarthritis, unspecified site: Secondary | ICD-10-CM | POA: Diagnosis not present

## 2021-05-14 DIAGNOSIS — E1169 Type 2 diabetes mellitus with other specified complication: Secondary | ICD-10-CM | POA: Diagnosis not present

## 2021-05-14 DIAGNOSIS — Z7982 Long term (current) use of aspirin: Secondary | ICD-10-CM | POA: Diagnosis not present

## 2021-05-14 DIAGNOSIS — Z7984 Long term (current) use of oral hypoglycemic drugs: Secondary | ICD-10-CM | POA: Diagnosis not present

## 2021-05-14 DIAGNOSIS — G8929 Other chronic pain: Secondary | ICD-10-CM | POA: Diagnosis not present

## 2021-05-14 DIAGNOSIS — R030 Elevated blood-pressure reading, without diagnosis of hypertension: Secondary | ICD-10-CM | POA: Diagnosis not present

## 2021-05-14 DIAGNOSIS — M545 Low back pain, unspecified: Secondary | ICD-10-CM | POA: Diagnosis not present

## 2021-05-15 DIAGNOSIS — M1712 Unilateral primary osteoarthritis, left knee: Secondary | ICD-10-CM | POA: Diagnosis not present

## 2021-05-15 DIAGNOSIS — M25662 Stiffness of left knee, not elsewhere classified: Secondary | ICD-10-CM | POA: Diagnosis not present

## 2021-05-15 DIAGNOSIS — M6281 Muscle weakness (generalized): Secondary | ICD-10-CM | POA: Diagnosis not present

## 2021-05-15 DIAGNOSIS — Z96652 Presence of left artificial knee joint: Secondary | ICD-10-CM | POA: Diagnosis not present

## 2021-05-15 DIAGNOSIS — M25562 Pain in left knee: Secondary | ICD-10-CM | POA: Diagnosis not present

## 2021-05-18 DIAGNOSIS — M1712 Unilateral primary osteoarthritis, left knee: Secondary | ICD-10-CM | POA: Diagnosis not present

## 2021-05-18 DIAGNOSIS — Z96652 Presence of left artificial knee joint: Secondary | ICD-10-CM | POA: Diagnosis not present

## 2021-05-18 DIAGNOSIS — M25562 Pain in left knee: Secondary | ICD-10-CM | POA: Diagnosis not present

## 2021-05-18 DIAGNOSIS — M6281 Muscle weakness (generalized): Secondary | ICD-10-CM | POA: Diagnosis not present

## 2021-05-18 DIAGNOSIS — M25662 Stiffness of left knee, not elsewhere classified: Secondary | ICD-10-CM | POA: Diagnosis not present

## 2021-05-22 DIAGNOSIS — M25562 Pain in left knee: Secondary | ICD-10-CM | POA: Diagnosis not present

## 2021-05-22 DIAGNOSIS — M25662 Stiffness of left knee, not elsewhere classified: Secondary | ICD-10-CM | POA: Diagnosis not present

## 2021-05-22 DIAGNOSIS — Z96652 Presence of left artificial knee joint: Secondary | ICD-10-CM | POA: Diagnosis not present

## 2021-05-22 DIAGNOSIS — M1712 Unilateral primary osteoarthritis, left knee: Secondary | ICD-10-CM | POA: Diagnosis not present

## 2021-05-22 DIAGNOSIS — M6281 Muscle weakness (generalized): Secondary | ICD-10-CM | POA: Diagnosis not present

## 2021-05-28 ENCOUNTER — Other Ambulatory Visit: Payer: Self-pay | Admitting: Gastroenterology

## 2021-05-28 DIAGNOSIS — E119 Type 2 diabetes mellitus without complications: Secondary | ICD-10-CM | POA: Diagnosis not present

## 2021-05-28 DIAGNOSIS — K8681 Exocrine pancreatic insufficiency: Secondary | ICD-10-CM | POA: Diagnosis not present

## 2021-05-28 DIAGNOSIS — R935 Abnormal findings on diagnostic imaging of other abdominal regions, including retroperitoneum: Secondary | ICD-10-CM

## 2021-05-28 DIAGNOSIS — E785 Hyperlipidemia, unspecified: Secondary | ICD-10-CM | POA: Diagnosis not present

## 2021-05-28 DIAGNOSIS — R194 Change in bowel habit: Secondary | ICD-10-CM | POA: Diagnosis not present

## 2021-05-28 DIAGNOSIS — R634 Abnormal weight loss: Secondary | ICD-10-CM | POA: Diagnosis not present

## 2021-05-28 DIAGNOSIS — R933 Abnormal findings on diagnostic imaging of other parts of digestive tract: Secondary | ICD-10-CM | POA: Diagnosis not present

## 2021-05-31 ENCOUNTER — Other Ambulatory Visit: Payer: Self-pay

## 2021-05-31 ENCOUNTER — Ambulatory Visit
Admission: RE | Admit: 2021-05-31 | Discharge: 2021-05-31 | Disposition: A | Discharge status: Home / Self Care | Payer: Medicare HMO | Source: Ambulatory Visit | Attending: Gastroenterology | Admitting: Gastroenterology

## 2021-05-31 DIAGNOSIS — N281 Cyst of kidney, acquired: Secondary | ICD-10-CM | POA: Diagnosis not present

## 2021-05-31 DIAGNOSIS — R935 Abnormal findings on diagnostic imaging of other abdominal regions, including retroperitoneum: Secondary | ICD-10-CM

## 2021-05-31 DIAGNOSIS — D1803 Hemangioma of intra-abdominal structures: Secondary | ICD-10-CM | POA: Diagnosis not present

## 2021-05-31 DIAGNOSIS — K8689 Other specified diseases of pancreas: Secondary | ICD-10-CM | POA: Diagnosis not present

## 2021-05-31 DIAGNOSIS — R634 Abnormal weight loss: Secondary | ICD-10-CM | POA: Diagnosis not present

## 2021-05-31 MED ORDER — GADOBENATE DIMEGLUMINE 529 MG/ML IV SOLN
15.0000 mL | Freq: Once | INTRAVENOUS | Status: AC | PRN
  Administered 2021-05-31: 15 mL via INTRAVENOUS

## 2021-06-03 DIAGNOSIS — K8681 Exocrine pancreatic insufficiency: Secondary | ICD-10-CM | POA: Insufficient documentation

## 2021-06-16 ENCOUNTER — Other Ambulatory Visit: Payer: Self-pay | Admitting: Gastroenterology

## 2021-07-15 ENCOUNTER — Encounter (HOSPITAL_COMMUNITY): Payer: Self-pay | Admitting: Gastroenterology

## 2021-07-15 NOTE — Progress Notes (Signed)
Attempted to obtain medical history via telephone, unable to reach at this time. I left a voicemail to return pre surgical testing department's phone call.  

## 2021-07-23 NOTE — Anesthesia Preprocedure Evaluation (Addendum)
Anesthesia Evaluation  Patient identified by MRN, date of birth, ID band Patient awake    Reviewed: Allergy & Precautions, NPO status , Patient's Chart, lab work & pertinent test results  History of Anesthesia Complications Negative for: history of anesthetic complications  Airway Mallampati: II  TM Distance: >3 FB Neck ROM: Full    Dental no notable dental hx. (+) Dental Advisory Given,    Pulmonary neg pulmonary ROS,    Pulmonary exam normal        Cardiovascular Normal cardiovascular exam  HLD   Neuro/Psych negative neurological ROS  negative psych ROS   GI/Hepatic Neg liver ROS, GERD  Medicated,  Endo/Other  diabetes  Renal/GU negative Renal ROS  negative genitourinary   Musculoskeletal  (+) Arthritis ,   Abdominal   Peds  Hematology negative hematology ROS (+)   Anesthesia Other Findings   Reproductive/Obstetrics                            Anesthesia Physical  Anesthesia Plan  ASA: 2  Anesthesia Plan: MAC   Post-op Pain Management:  Regional for Post-op pain   Induction:   PONV Risk Score and Plan: 1 and Propofol infusion and Ondansetron  Airway Management Planned: Natural Airway  Additional Equipment:   Intra-op Plan:   Post-operative Plan:   Informed Consent: I have reviewed the patients History and Physical, chart, labs and discussed the procedure including the risks, benefits and alternatives for the proposed anesthesia with the patient or authorized representative who has indicated his/her understanding and acceptance.     Dental advisory given  Plan Discussed with: Anesthesiologist  Anesthesia Plan Comments:        Anesthesia Quick Evaluation

## 2021-07-24 ENCOUNTER — Ambulatory Visit (HOSPITAL_COMMUNITY): Payer: Medicare HMO | Admitting: Anesthesiology

## 2021-07-24 ENCOUNTER — Other Ambulatory Visit: Payer: Self-pay

## 2021-07-24 ENCOUNTER — Encounter (HOSPITAL_COMMUNITY)
Admission: RE | Disposition: A | Discharge status: Home / Self Care | Payer: Self-pay | Source: Home / Self Care | Attending: Gastroenterology

## 2021-07-24 ENCOUNTER — Ambulatory Visit (HOSPITAL_COMMUNITY)
Admission: RE | Admit: 2021-07-24 | Discharge: 2021-07-24 | Disposition: A | Discharge status: Home / Self Care | Payer: Medicare HMO | Attending: Gastroenterology | Admitting: Gastroenterology

## 2021-07-24 ENCOUNTER — Encounter (HOSPITAL_COMMUNITY): Payer: Self-pay | Admitting: Gastroenterology

## 2021-07-24 DIAGNOSIS — Z6822 Body mass index (BMI) 22.0-22.9, adult: Secondary | ICD-10-CM | POA: Insufficient documentation

## 2021-07-24 DIAGNOSIS — E119 Type 2 diabetes mellitus without complications: Secondary | ICD-10-CM | POA: Diagnosis not present

## 2021-07-24 DIAGNOSIS — K8689 Other specified diseases of pancreas: Secondary | ICD-10-CM | POA: Insufficient documentation

## 2021-07-24 DIAGNOSIS — I700 Atherosclerosis of aorta: Secondary | ICD-10-CM | POA: Insufficient documentation

## 2021-07-24 DIAGNOSIS — R634 Abnormal weight loss: Secondary | ICD-10-CM | POA: Diagnosis not present

## 2021-07-24 DIAGNOSIS — R911 Solitary pulmonary nodule: Secondary | ICD-10-CM | POA: Diagnosis not present

## 2021-07-24 DIAGNOSIS — K648 Other hemorrhoids: Secondary | ICD-10-CM | POA: Diagnosis not present

## 2021-07-24 DIAGNOSIS — M1990 Unspecified osteoarthritis, unspecified site: Secondary | ICD-10-CM | POA: Diagnosis not present

## 2021-07-24 DIAGNOSIS — R933 Abnormal findings on diagnostic imaging of other parts of digestive tract: Secondary | ICD-10-CM | POA: Insufficient documentation

## 2021-07-24 DIAGNOSIS — E785 Hyperlipidemia, unspecified: Secondary | ICD-10-CM | POA: Insufficient documentation

## 2021-07-24 DIAGNOSIS — Z86010 Personal history of colonic polyps: Secondary | ICD-10-CM | POA: Insufficient documentation

## 2021-07-24 DIAGNOSIS — K219 Gastro-esophageal reflux disease without esophagitis: Secondary | ICD-10-CM | POA: Insufficient documentation

## 2021-07-24 HISTORY — PX: UPPER ESOPHAGEAL ENDOSCOPIC ULTRASOUND (EUS): SHX6562

## 2021-07-24 HISTORY — PX: ESOPHAGOGASTRODUODENOSCOPY (EGD) WITH PROPOFOL: SHX5813

## 2021-07-24 LAB — GLUCOSE, CAPILLARY: Glucose-Capillary: 118 mg/dL — ABNORMAL HIGH (ref 70–99)

## 2021-07-24 SURGERY — UPPER ESOPHAGEAL ENDOSCOPIC ULTRASOUND (EUS)
Anesthesia: Monitor Anesthesia Care

## 2021-07-24 MED ORDER — SODIUM CHLORIDE 0.9 % IV SOLN: INTRAVENOUS | Status: DC

## 2021-07-24 MED ORDER — LACTATED RINGERS IV SOLN: INTRAVENOUS | Status: DC

## 2021-07-24 MED ORDER — LIDOCAINE 2% (20 MG/ML) 5 ML SYRINGE
INTRAMUSCULAR | Status: DC | PRN
  Administered 2021-07-24: 60 mg via INTRAVENOUS

## 2021-07-24 MED ORDER — ONDANSETRON HCL 4 MG/2ML IJ SOLN
INTRAMUSCULAR | Status: DC | PRN
  Administered 2021-07-24: 4 mg via INTRAVENOUS

## 2021-07-24 MED ORDER — PROPOFOL 500 MG/50ML IV EMUL
INTRAVENOUS | Status: DC | PRN
  Administered 2021-07-24: 130 ug/kg/min via INTRAVENOUS

## 2021-07-24 MED ORDER — PROPOFOL 10 MG/ML IV BOLUS
INTRAVENOUS | Status: DC | PRN
  Administered 2021-07-24 (×5): 20 mg via INTRAVENOUS

## 2021-07-24 MED ORDER — PROPOFOL 1000 MG/100ML IV EMUL
INTRAVENOUS | Status: AC
  Filled 2021-07-24: qty 100

## 2021-07-24 NOTE — Transfer of Care (Signed)
Immediate Anesthesia Transfer of Care Note  Patient: Todd Lawrence  Procedure(s) Performed: UPPER ESOPHAGEAL ENDOSCOPIC ULTRASOUND (EUS)  Patient Location: PACU  Anesthesia Type:MAC  Level of Consciousness: awake, alert  and oriented  Airway & Oxygen Therapy: Patient Spontanous Breathing and Patient connected to face mask oxygen  Post-op Assessment: Report given to RN and Post -op Vital signs reviewed and stable  Post vital signs: Reviewed and stable  Last Vitals:  Vitals Value Taken Time  BP    Temp    Pulse    Resp    SpO2      Last Pain:  Vitals:   07/24/21 0648  TempSrc: Oral  PainSc: 0-No pain         Complications: No notable events documented.

## 2021-07-24 NOTE — Anesthesia Postprocedure Evaluation (Signed)
Anesthesia Post Note  Patient: Todd Lawrence  Procedure(s) Performed: UPPER ESOPHAGEAL ENDOSCOPIC ULTRASOUND (EUS)     Patient location during evaluation: PACU Anesthesia Type: MAC Level of consciousness: awake and alert Pain management: pain level controlled Vital Signs Assessment: post-procedure vital signs reviewed and stable Respiratory status: spontaneous breathing and respiratory function stable Cardiovascular status: stable Postop Assessment: no apparent nausea or vomiting Anesthetic complications: no   No notable events documented.  Last Vitals:  Vitals:   07/24/21 0812 07/24/21 0820  BP: 138/69 129/83  Pulse: 77 73  Resp: 16 15  Temp:    SpO2: 97% 96%    Last Pain:  Vitals:   07/24/21 0820  TempSrc:   PainSc: 0-No pain                 Allysa Governale DANIEL

## 2021-07-24 NOTE — Op Note (Signed)
[facility] Patient Name: Todd Lawrence Procedure Date: 07/24/2021 MRN: [id number] Attending MD: Carol Ada , MD Date of Birth: 1950-10-05 CSN: [id number] Age: 71 Admit Type: Outpatient Procedure:                Upper EUS Indications:              Dilated pancreatic duct on CT scan Providers:                Carol Ada, MD, Dulcy Fanny, Frazier Richards,                            Technician, Derrek Gu. Alday CRNA, CRNA Referring MD:              Medicines:                Propofol per Anesthesia Complications:            No immediate complications. Estimated Blood Loss:     Estimated blood loss: none. Procedure:                Pre-Anesthesia Assessment:                           - Prior to the procedure, a History and Physical                            was performed, and patient medications and                            allergies were reviewed. The patient's tolerance of                            previous anesthesia was also reviewed. The risks                            and benefits of the procedure and the sedation                            options and risks were discussed with the patient.                            All questions were answered, and informed consent                            was obtained. Prior Anticoagulants: The patient has                            taken no previous anticoagulant or antiplatelet                            agents. ASA Grade Assessment: II - A patient with                            mild systemic disease. After reviewing the risks  and benefits, the patient was deemed in                            satisfactory condition to undergo the procedure.                           - Sedation was administered by an anesthesia                            professional. Deep sedation was attained.                           After obtaining informed consent, the endoscope was                            passed under  direct vision. Throughout the                            procedure, the patient's blood pressure, pulse, and                            oxygen saturations were monitored continuously. The                            [id number] ([id number] Olympus linear ultrasound scope                            was introduced through the mouth, and advanced to                            the second part of duodenum. The upper EUS was                            accomplished without difficulty. The patient                            tolerated the procedure well. Scope In: Scope Out: Findings:      ENDOSONOGRAPHIC FINDING: :      The pancreatic duct had a dilated endosonographic appearance in the genu       of the pancreas, body of the pancreas and tail of the pancreas. The       pancreatic duct measured up to 6 mm in diameter.      The PD was noted to be dilated from the neck to the proximal tail of the       pancreas at approximatly 6.6 mm. There was no evidence of any cystic       lesions and the the parenchyma of the pancreas appeared normal, however,       it was smaller in volume. The PD at the head of the pancreas was       measured to be 4.3 mm. There was no evidence of any masses in the head       of the pancreas. The CBD was approximately 4 mm. Impression:               - The pancreatic duct  had a dilated endosonographic                            appearance in the genu of the pancreas, body of the                            pancreas and tail of the pancreas. The pancreatic                            duct measured up to 6 mm in diameter.                           - No specimens collected. Moderate Sedation:      Not Applicable - Patient had care per Anesthesia. Recommendation:           - Patient has a contact number available for                            emergencies. The signs and symptoms of potential                            delayed complications were discussed with the                             patient. Return to normal activities tomorrow.                            Written discharge instructions were provided to the                            patient.                           - Resume previous diet.                           - Follow up with Dr. Collene Mares PRN. Procedure Code(s):        --- Professional ---                           [phone], Esophagogastroduodenoscopy, flexible,                            transoral; with endoscopic ultrasound examination                            limited to the esophagus, stomach or duodenum, and                            adjacent structures Diagnosis Code(s):        --- Professional ---                           K86.89, Other specified diseases of pancreas  R93.3, Abnormal findings on diagnostic imaging of                            other parts of digestive tract CPT copyright 2019 [employer]. All rights reserved. The codes documented in this report are preliminary and upon coder review may  be revised to meet current compliance requirements. Carol Ada, MD Carol Ada, MD 07/24/2021 8:07:35 AM This report has been signed electronically. Number of Addenda: 0

## 2021-07-24 NOTE — Discharge Instructions (Signed)

## 2021-07-24 NOTE — H&P (Signed)
Todd Lawrence HPI: This 71 year old white male presents to the office for further evaluation of abnormal weight loss and an abnormal finding on a recent CT scan of the abdomen. He is accompanied by his wife today. He has a CT scan of the abdomen and pelvis on 05/08/2021 which revealed marked pancreatic parenchymal atrophy noted with ductal dilatation in the tail and body without evidence of lesion; a 4 mm left solid pulmonary nodule is noted; no acute finding masses or adenopathy was noted. No routine follow up imaging was recommended; aortic atherosclerosis and a dilated left great saphenous vein was also noted. He has 1 BM per day with no obvious blood or mucus in the stool.  At times, he can go up to 2 days without having a BM. He has a lot of gas and bloating, usually at night.  He has a good appetite but he has lost 18 pounds since February of this year. He denies having any complaints of abdominal pain, nausea, vomiting, acid reflux, dysphagia or odynophagia. He denies having a family history of colon cancer, celiac sprue or IBD. His last colonoscopy done on 05/08/2018  revealed prominent internal hemorrhoids and a hyperplastic polyp was removed from the descending colon.  Past Medical History:  Diagnosis Date   Acute meniscal tear of left knee    DDD (degenerative disc disease), lumbar    has received ESI x 3   Diabetes mellitus without complication (HCC)    GERD (gastroesophageal reflux disease)    History of blood transfusion    History of staph infection    after thumb surgery   Hyperlipidemia    Left rotator cuff tear    Primary localized osteoarthritis of left knee     Past Surgical History:  Procedure Laterality Date   CERVICAL FUSION     COLONOSCOPY     FOOT SURGERY Right    JOINT REPLACEMENT N/A    Phreesia 12/02/2019   KNEE ARTHROSCOPY Bilateral    SHOULDER ARTHROSCOPY Left    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 He has never used smokeless tobacco. He reports that he does not drink alcohol and does not use drugs.  Allergies: No Known Allergies  Medications: Scheduled: Continuous:  sodium chloride     lactated ringers      Results for orders placed or performed during the hospital encounter of 07/24/21 (from the past 24 hour(s))  Glucose, capillary     Status: Abnormal   Collection Time: 07/24/21  7:00 AM  Result Value Ref Range   Glucose-Capillary 118 (H) 70 - 99 mg/dL     No results found.  ROS:  As stated above in the HPI otherwise negative.  Blood pressure 120/67, pulse 80, temperature 98.2 F (36.8 C), temperature source Oral, resp. rate 13, height 5\' 10"  (1.778 m), weight 70.8 kg, SpO2 99 %.    PE: Gen: NAD, Alert and Oriented HEENT:  [place]/AT, EOMI Neck: Supple, no LAD Lungs: CTA Bilaterally CV: RRR without M/G/R ABD: Soft, NTND, +BS Ext: No C/C/E  Assessment/Plan: 1) Dilated PD -  EUS.  Todd Lawrence D 07/24/2021, 7:29 AM

## 2021-07-27 ENCOUNTER — Encounter (HOSPITAL_COMMUNITY): Payer: Self-pay | Admitting: Gastroenterology

## 2021-07-30 ENCOUNTER — Telehealth: Payer: Self-pay | Admitting: Pharmacist

## 2021-07-30 NOTE — Progress Notes (Addendum)
Chronic Care Management Pharmacy Assistant   Name: Todd Lawrence  MRN: [id number] DOB: 02-24-1951   Reason for Encounter: Disease State - General Adherence Call    Recent office visits:  05/11/21 Todd Luo, MD - Family Medicine - Diabetes - No notes available.    Recent consult visits:  05/28/21 Todd Lawrence - Abnormal findings on diagnostic imaging - No notes available.  05/22/21 Todd Lawrence - Stiffness of left knee - No notes available.  05/18/21 Todd Lawrence - Stiffness of left knee - No notes available  05/15/21 Todd Lawrence - Stiffness of left knee - No notes available  05/14/21 Todd Lawrence - COPD - No notes available.   05/11/21 Todd Lawrence - Stiffness of left knee - No notes available.  05/08/21 Todd Lawrence - Stiffness of left knee - No notes available  05/05/21 Todd Lawrence, Osteoarthritis of left knee - No notes available.  05/04/21 Todd Lawrence - Stiffness of left knee - No notes available  05/01/21 Todd Lawrence - Stiffness of left knee - No notes available   Hospital visits:  Medication Reconciliation was completed by comparing discharge summary, patients EMR and Pharmacy list, and upon discussion with patient.  [facility] on 07[facility]21 due to Endoscopy. Discharge date was 07/24/21. Discharged from [place]?Medications Started at [facility] Discharge:?? None noted.   Medication Changes at Hospital Discharge: None noted.   Medications Discontinued at Hospital Discharge: None noted.   Medications that remain Todd same after Hospital Discharge:??  All other medications will remain Todd same.    Medications: Outpatient Encounter Medications as of 07/30/2021  Medication Sig Note   aspirin EC 81 MG tablet Take 81 mg by mouth daily. Swallow whole.    atorvastatin (LIPITOR) 20 MG tablet TAKE 1 TABLET BY MOUTH EVERY DAY    CREON 36000-114000 units CPEP capsule Take 36,000 Units by mouth 3  (three) times daily with meals. 07/14/2021: SAMPLE MED    hypromellose (SYSTANE OVERNIGHT THERAPY) 0.3 % GEL ophthalmic ointment Place 1 application into both eyes at bedtime.    ketotifen (ZADITOR) 0.025 % ophthalmic solution Place 1 drop into both eyes 2 (two) times daily.    metFORMIN (GLUCOPHAGE) 1000 MG tablet Take 1 tablet (1,000 mg total) by mouth 2 (two) times daily with a meal.    Misc Natural Products (OSTEO BI-FLEX ADV JOINT SHIELD PO) Take 1 tablet by mouth in Todd morning and at bedtime.    Multiple Vitamin (MULTIVITAMIN WITH MINERALS) TABS tablet Take 1 tablet by mouth daily.    omeprazole (PRILOSEC) 40 MG capsule TAKE 1 CAPSULE BY MOUTH EVERY DAY (Patient taking differently: Take 40 mg by mouth daily as needed (acid reflux).)    pioglitazone (ACTOS) 30 MG tablet Take 1 tablet (30 mg total) by mouth daily.    Propylene Glycol (SYSTANE BALANCE) 0.6 % SOLN Place 1 drop into both eyes in Todd morning and at bedtime.    No facility-administered encounter medications on file as of 07/30/2021.    Have you had any problems recently with your health? Patient reported having some GI issues but recently had endoscopy done and is getting better.   Have you had any problems with your pharmacy? Patient denied any issues with his current pharmacy.   What issues or side effects are you having with your medications? Patient denied any side effects or issues with his current medications.   What would you like me to pass along  to Todd Lawrence, CPP for them to help you with?  Patient reported they are trying to get Creon covered with Todd Lawrence. Todd Lawrence office has submitted PAP and waiting for outcome. He plans to get Todd Lawrence in Todd Lawrence due to holidays and wanting to have some time after Todd holidays to recheck it.   What can we do to take care of you better? Patient did not have any recommendations at this time.   Care Gaps  AWV: done 12/12/20  Colonoscopy: done 03/28/19 DM Eye Exam: due 03/31/22 DM  Foot Exam: due 01/16/22 Microalbumin: done 01/02/21 HbgAIC: done 04/20/21 (6.8) DEXA: N/A Mammogram: N/A   Star Rating Drugs: pioglitazone (ACTOS) 30 MG tablet - last filled 07/15/21 30 days  metFORMIN (GLUCOPHAGE) 1000 MG tablet - last filled 05/23/21 90 days  atorvastatin (LIPITOR) 20 MG tablet - last filled 06/15/21 90 days   Future Appointments  Date Time Provider [place]  11/05/2021  3:45 PM [facility] PHARMACIST [facility] None    Todd Lawrence, CCMA Clinical Pharmacist Assistant  [phone]  7 minutes spent in review, coordination, and documentation.  Will Lawrence with Creon if needed.  Reviewed most recent MD visits.  Reviewed by: Todd Lawrence, PharmD Clinical Pharmacist [phone]

## 2021-08-17 ENCOUNTER — Other Ambulatory Visit: Payer: Self-pay | Admitting: Family Medicine

## 2021-08-17 MED ORDER — TIZANIDINE HCL 4 MG PO TABS: 4.0000 mg | ORAL_TABLET | Freq: Four times a day (QID) | ORAL | 0 refills | Status: DC | PRN

## 2021-08-27 DIAGNOSIS — E118 Type 2 diabetes mellitus with unspecified complications: Secondary | ICD-10-CM | POA: Diagnosis not present

## 2021-08-27 DIAGNOSIS — E782 Mixed hyperlipidemia: Secondary | ICD-10-CM | POA: Diagnosis not present

## 2021-08-27 DIAGNOSIS — R140 Abdominal distension (gaseous): Secondary | ICD-10-CM | POA: Diagnosis not present

## 2021-08-27 DIAGNOSIS — K8681 Exocrine pancreatic insufficiency: Secondary | ICD-10-CM | POA: Diagnosis not present

## 2021-08-31 ENCOUNTER — Ambulatory Visit (INDEPENDENT_AMBULATORY_CARE_PROVIDER_SITE_OTHER): Payer: Medicare HMO | Admitting: Family Medicine

## 2021-08-31 ENCOUNTER — Other Ambulatory Visit: Payer: Self-pay

## 2021-08-31 ENCOUNTER — Encounter: Payer: Self-pay | Admitting: Family Medicine

## 2021-08-31 VITALS — BP 142/82 | HR 83 | Temp 97.9°F | Resp 18 | Ht 70.0 in | Wt 166.0 lb

## 2021-08-31 DIAGNOSIS — M48061 Spinal stenosis, lumbar region without neurogenic claudication: Secondary | ICD-10-CM | POA: Diagnosis not present

## 2021-08-31 DIAGNOSIS — E782 Mixed hyperlipidemia: Secondary | ICD-10-CM | POA: Insufficient documentation

## 2021-08-31 DIAGNOSIS — M5136 Other intervertebral disc degeneration, lumbar region: Secondary | ICD-10-CM | POA: Diagnosis not present

## 2021-08-31 MED ORDER — PREDNISONE 20 MG PO TABS: ORAL_TABLET | ORAL | 0 refills | Status: DC

## 2021-08-31 NOTE — Progress Notes (Signed)
Subjective:    Patient ID: Todd Lawrence, male    DOB: 12/20/1950, 71 y.o.   MRN: [id number]  Back Pain  Patient presents today complaining of back pain.  Patient had an MRI of the lumbar spine in 2017.  Below I have included the relevant portions of the MRI report. "L3-4: Mild disc bulging and mild facet and ligamentum flavum hypertrophy result in minimal bilateral neural foraminal stenosis without significant spinal stenosis.   L4-5: Moderately large central disc protrusion and mild facet hypertrophy result in moderate spinal stenosis and moderate bilateral neural foraminal stenosis. Mild disc bulging contributes to minimal bilateral neural foraminal stenosis.   L5-S1: Small central disc protrusion with annular fissure may contact the S1 nerve roots bilaterally without frank neural compression. At most mild left lateral recess stenosis. Mild disc bulging results in minimal bilateral neural foraminal narrowing. 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  Ever since that time he has had pain in the center of his lower back roughly at the level of L4-L5.  If he hyperextends his back he will have sharp severe pain in the center of his back and pain that radiates down his right leg.  He reports numbness and tingling in his right leg.  He also reports pain radiating into his right gluteus and down his right hamstring.  He denies any saddle anesthesia or bowel or bladder incontinence.  He denies any leg weakness.  However he states that when he was walking yesterday, his legs felt weak and he felt like he had to sit down because he just did not have the strength to keep walking. Past Medical History:  Diagnosis Date   Acute meniscal tear of left knee    DDD (degenerative disc disease), lumbar    has received ESI x 3   Diabetes mellitus without complication (HCC)    GERD (gastroesophageal reflux disease)    History of blood  transfusion    History of staph infection    after thumb surgery   Hyperlipidemia    Left rotator cuff tear    Primary localized osteoarthritis of left knee    Past Surgical History:  Procedure Laterality Date   CERVICAL FUSION     COLONOSCOPY     ESOPHAGOGASTRODUODENOSCOPY (EGD) WITH PROPOFOL N/A 07/24/2021   Procedure: ESOPHAGOGASTRODUODENOSCOPY (EGD) WITH PROPOFOL;  Surgeon: Carol Ada, MD;  Location: [facility];  Service: Endoscopy;  Laterality: N/A;   FOOT SURGERY Right    JOINT REPLACEMENT N/A    Phreesia 12/02/2019   KNEE ARTHROSCOPY Bilateral    SHOULDER ARTHROSCOPY Left    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 Swallow whole.     atorvastatin (LIPITOR) 20 MG tablet TAKE 1 TABLET BY MOUTH EVERY DAY 90 tablet 3   CREON 36000-114000 units CPEP capsule Take 36,000 Units by mouth 3 (three) times daily with meals.  hypromellose (SYSTANE OVERNIGHT THERAPY) 0.3 % GEL ophthalmic ointment Place 1 application into both eyes at bedtime.     ketotifen (ZADITOR) 0.025 % ophthalmic solution Place 1 drop into both eyes 2 (two) times daily.     metFORMIN (GLUCOPHAGE) 1000 MG tablet Take 1 tablet (1,000 mg total) by mouth 2 (two) times daily with a meal. 180 tablet 3   Misc Natural Products (OSTEO BI-FLEX ADV JOINT SHIELD PO) Take 1 tablet by mouth in the morning and at bedtime.     Multiple Vitamin (MULTIVITAMIN WITH MINERALS) TABS tablet Take 1 tablet  by mouth daily.     omeprazole (PRILOSEC) 40 MG capsule TAKE 1 CAPSULE BY MOUTH EVERY DAY (Patient taking differently: Take 40 mg by mouth daily as needed (acid reflux).) 30 capsule 11   pioglitazone (ACTOS) 30 MG tablet Take 1 tablet (30 mg total) by mouth daily. 30 tablet 11   Propylene Glycol (SYSTANE BALANCE) 0.6 % SOLN Place 1 drop into both eyes in the morning and at bedtime.     tiZANidine (ZANAFLEX) 4 MG tablet Take 1 tablet (4 mg total) by mouth every 6 (six) hours as needed for muscle spasms. 30 tablet 0   No current facility-administered medications on file prior to visit.   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  Other Topics Concern   Not on file  Social History Narrative   Not on file   Social Determinants of Health   Financial Resource Strain: Low Risk    Difficulty of Paying Living Expenses: Not hard at all  Food Insecurity: No Food Insecurity   Worried About Charity fundraiser in the Last Year: Never true   Arboriculturist in the Last Year: Never true  Transportation Needs: No Transportation Needs   Lack of Transportation (Medical): No   Lack of Transportation (Non-Medical): No  Physical Activity: Inactive   Days of Exercise per Week: 0 days   Minutes of Exercise per Session: 0 min  Stress: No Stress Concern Present   Feeling of Stress : Not at all  Social Connections: Moderately Integrated   Frequency of Communication with Friends and Family: Three times a week   Frequency of Social  Gatherings with Friends and Family: More than three times a week   Attends Religious Services: More than 4 times per year   Active Member of [employer] or Organizations: No   Attends Music therapist: Never   Marital Status: Married  Human resources officer Violence: Not At Risk   Fear of Current or Ex-Partner: No   Emotionally Abused: No   Physically Abused: No   Sexually Abused: No   Family History  Problem Relation Age of Onset   Heart disease Father    Asthma Sister    Arthritis Sister      Review of Systems  Musculoskeletal:  Positive for back pain.  Objective:   Physical Exam Vitals reviewed.  Constitutional:      General: He is not in acute distress.    Appearance: Normal appearance. He is normal weight.  Cardiovascular:     Rate and Rhythm: Normal rate and regular rhythm.     Pulses: Normal pulses.     Heart sounds: Normal heart sounds. No murmur heard. Pulmonary:     Effort: Pulmonary effort is normal. No respiratory distress.     Breath sounds: Normal breath sounds. No wheezing, rhonchi or rales.  Musculoskeletal:     Lumbar back: Tenderness present. Decreased range of motion.       Back:       Legs:  Neurological:     General: No focal deficit present.     Mental Status: He is alert and oriented to person, place, and time.     Motor: No weakness.     Gait: Gait normal.          Assessment & Plan:  DDD (degenerative disc disease), lumbar  Spinal stenosis at L4-L5 level  I suspect that the moderate spinal stenosis due to the bulging disc at L4-L5 from 2017 has worsened acutely due to a herniated disc that the patient recently suffered while helping to change a tire.  He has tried muscle relaxers with no relief.  Begin a prednisone taper pack for what I suspect is a herniated disc.  If not improving, the next step would be an MRI of the lumbar spine to evaluate further.

## 2021-09-08 ENCOUNTER — Ambulatory Visit (INDEPENDENT_AMBULATORY_CARE_PROVIDER_SITE_OTHER): Payer: Medicare HMO | Admitting: Family Medicine

## 2021-09-08 ENCOUNTER — Encounter: Payer: Self-pay | Admitting: Family Medicine

## 2021-09-08 ENCOUNTER — Other Ambulatory Visit: Payer: Self-pay

## 2021-09-08 VITALS — BP 132/88 | HR 86 | Temp 97.3°F | Resp 18 | Ht 70.0 in | Wt 166.0 lb

## 2021-09-08 DIAGNOSIS — M5136 Other intervertebral disc degeneration, lumbar region: Secondary | ICD-10-CM

## 2021-09-08 DIAGNOSIS — S46912A Strain of unspecified muscle, fascia and tendon at shoulder and upper arm level, left arm, initial encounter: Secondary | ICD-10-CM

## 2021-09-08 DIAGNOSIS — M5431 Sciatica, right side: Secondary | ICD-10-CM

## 2021-09-08 DIAGNOSIS — M48061 Spinal stenosis, lumbar region without neurogenic claudication: Secondary | ICD-10-CM

## 2021-09-08 MED ORDER — CARISOPRODOL 350 MG PO TABS: 350.0000 mg | ORAL_TABLET | Freq: Four times a day (QID) | ORAL | 0 refills | Status: DC | PRN

## 2021-09-08 MED ORDER — DICLOFENAC SODIUM 75 MG PO TBEC: 75.0000 mg | DELAYED_RELEASE_TABLET | Freq: Two times a day (BID) | ORAL | 0 refills | Status: DC

## 2021-09-08 NOTE — Progress Notes (Signed)
Subjective:    Patient ID: Todd Lawrence, male    DOB: [birth year], 71 y.o.   MRN: [id number]  Back Pain 08/31/21 Patient presents today complaining of back pain.  Patient had an MRI of the lumbar spine in 2017.  Below I have included the relevant portions of the MRI report. "L3-4: Mild disc bulging and mild facet and ligamentum flavum hypertrophy result in minimal bilateral neural foraminal stenosis without significant spinal stenosis.   L4-5: Moderately large central disc protrusion and mild facet hypertrophy result in moderate spinal stenosis and moderate bilateral neural foraminal stenosis. Mild disc bulging contributes to minimal bilateral neural foraminal stenosis.   L5-S1: Small central disc protrusion with annular fissure may contact the S1 nerve roots bilaterally without frank neural compression. At most mild left lateral recess stenosis. Mild disc bulging results in minimal bilateral neural foraminal narrowing. 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  Ever since that time he has had pain in the center of his lower back roughly at the level of L4-L5.  If he hyperextends his back he will have sharp severe pain in the center of his back and pain that radiates down his right leg.  He reports numbness and tingling in his right leg.  He also reports pain radiating into his right gluteus and down his right hamstring.  He denies any saddle anesthesia or bowel or bladder incontinence.  He denies any leg weakness.  However he states that when he was walking yesterday, his legs felt weak and he felt like he had to sit down because he just did not have the strength to keep walking.  At that time, my plan was:  I suspect that the moderate spinal stenosis due to the bulging disc at L4-L5 from 2017 has worsened acutely due to a herniated disc that the patient recently suffered while helping to change a tire.  He has tried muscle  relaxers with no relief.  Begin a prednisone taper pack for what I suspect is a herniated disc.  If not improving, the next step would be an MRI of the lumbar spine to evaluate further.  09/08/21 Patient continues to have pain with hyperextension of the back roughly around the level of L4-L5.  He continues to have occasional radiating pain going down his right leg and some numbness and tingling in his right leg.  This is slightly better from when I saw him a week ago including on prednisone.  However that is not his primary concern today.  Today he has a pain in his posterior left shoulder.  It is just medial to his shoulder blade.  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  Gradually over the weekend he developed this pain in his posterior left shoulder area.  It hurts to move his left arm behind his head.  It aches to lay on his left side at night.  He is tender to palpation in the area that his diagram.  He also occasionally has pain in his left axilla near the insertion of the pectoralis muscle.  If he presses in that area the pain feels better.  If he lifts his arm over his head the pain feels better.  He denies any pain radiating into his jaw.  He denies any chest pain or shortness of breath or dyspnea on exertion.  He denies any crushing substernal chest pain.  His wife was concerned because a family friend died after having pain radiating into his back.  I suspect that he had an aortic aneurysm or dissection of the aorta.  However the patient's pain today is very superficial and reproducible with palpation and movement Past Medical History:  Diagnosis Date   Acute meniscal tear of left knee    DDD (degenerative disc disease), lumbar    has received ESI x 3   Diabetes mellitus without complication (HCC)    GERD (gastroesophageal reflux disease)    History of blood transfusion    History of staph infection    after thumb surgery   Hyperlipidemia    Left  rotator cuff tear    Primary localized osteoarthritis of left knee    Past Surgical History:  Procedure Laterality Date   CERVICAL FUSION     COLONOSCOPY     ESOPHAGOGASTRODUODENOSCOPY (EGD) WITH PROPOFOL N/A 07/24/2021   Procedure: ESOPHAGOGASTRODUODENOSCOPY (EGD) WITH PROPOFOL;  Surgeon: Carol Ada, MD;  Location: [facility];  Service: Endoscopy;  Laterality: N/A;   FOOT SURGERY Right    JOINT REPLACEMENT N/A    [employer]reesia 12/02/2019   KNEE ARTHROSCOPY Bilateral    SHOULDER ARTHROSCOPY Left    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 Swallow whole.     atorvastatin (LIPITOR) 20 MG tablet TAKE 1 TABLET BY MOUTH EVERY DAY 90 tablet 3   CREON 36000-114000 units CPEP capsule Take 36,000 Units by mouth 3 (three) times daily with meals.     hypromellose (SYSTANE OVERNIGHT THERAPY) 0.3 % GEL ophthalmic ointment Place 1 application into both eyes at bedtime.     ketotifen (ZADITOR) 0.025 % ophthalmic solution Place 1 drop into both eyes 2 (two) times daily.     metFORMIN (GLUCOPHAGE) 1000 MG tablet Take 1 tablet (1,000 mg total) by mouth 2 (two) times daily with a meal. 180 tablet 3   Misc Natural Products (OSTEO BI-FLEX ADV JOINT SHIELD PO) Take 1 tablet by mouth in the morning and at bedtime.     Multiple Vitamin (MULTIVITAMIN WITH MINERALS) TABS tablet Take 1 tablet by mouth daily.     omeprazole (PRILOSEC) 40 MG capsule TAKE 1 CAPSULE BY MOUTH EVERY DAY  (Patient taking differently: Take 40 mg by mouth daily as needed (acid reflux).) 30 capsule 11   pioglitazone (ACTOS) 30 MG tablet Take 1 tablet (30 mg total) by mouth daily. 30 tablet 11   predniSONE (DELTASONE) 20 MG tablet 3 tabs poqday 1-2, 2 tabs poqday 3-4, 1 tab poqday 5-6 12 tablet 0   predniSONE (DELTASONE) 20 MG tablet 3 tabs poqday 1-2, 2 tabs poqday 3-4, 1 tab poqday 5-6 12 tablet 0   Propylene Glycol (SYSTANE BALANCE) 0.6 % SOLN Place 1 drop into both eyes in the morning and at bedtime.     tiZANidine (ZANAFLEX) 4 MG tablet Take 1 tablet (4 mg total) by mouth every 6 (six) hours as needed for muscle spasms.  30 tablet 0   No current facility-administered medications on file prior to visit.   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  Other Topics Concern   Not on file  Social History Narrative   Not on file   Social Determinants of Health   Financial Resource Strain: Low Risk    Difficulty of Paying Living Expenses: Not hard at all  Food Insecurity: No Food Insecurity   Worried About Charity fundraiser in the Last Year: Never true   Arboriculturist in the Last Year: Never true  Transportation Needs: No Transportation Needs   Lack of Transportation (Medical): No   Lack of Transportation (Non-Medical): No  Physical Activity: Inactive   Days of Exercise per Week: 0 days   Minutes of Exercise per Session: 0 min  Stress: No Stress Concern Present   Feeling of Stress : Not at all  Social Connections:  Moderately Integrated   Frequency of Communication with Friends and Family: Three times a week   Frequency of Social Gatherings with Friends and Family: More than three times a week   Attends Religious Services: More than 4 times per year   Active Member of [employer] or Organizations: No   Attends Music therapist: Never   Marital Status: Married  Human resources officer Violence: Not At Risk   Fear of Current or Ex-Partner: No   Emotionally Abused: No   Physically Abused: No   Sexually Abused: No   Family History  Problem Relation Age of Onset   Heart disease Father    Asthma Sister    Arthritis Sister      Review of Systems  Musculoskeletal:  Positive for back pain.      Objective:   Physical Exam Vitals reviewed.  Constitutional:      General: He is not in acute distress.    Appearance: Normal appearance. He is normal weight.  Neck:     Vascular: No carotid bruit.  Cardiovascular:     Rate and Rhythm: Normal rate and regular rhythm.     Pulses: Normal pulses.     Heart sounds: Normal heart sounds. No murmur heard.   No friction rub. No gallop.  Pulmonary:     Effort: Pulmonary effort is normal. No respiratory distress.     Breath sounds: Normal breath sounds. No wheezing, rhonchi or rales.  Musculoskeletal:        General: Tenderness present.     Thoracic back: Spasms and tenderness present. No bony tenderness.     Lumbar back: Tenderness present. Decreased range of motion.       Back:       Legs:  Neurological:     General: No focal deficit present.     Mental Status: He is alert and oriented to person, place, and time.     Motor: No weakness.     Gait: Gait  normal.          Assessment & Plan:  DDD (degenerative disc disease), lumbar - Plan: CANCELED: MR Lumbar Spine Wo Contrast  Foraminal stenosis of lumbar region - Plan: CANCELED: MR Lumbar Spine Wo Contrast  Back pain with right-sided sciatica - Plan: CANCELED: MR Lumbar Spine Wo  Contrast  Shoulder strain, left, initial encounter Patient continues to have pain in his lower back although it may be slightly better.  I recommended continued tincture of time.  I explained that the majority of lumbar radiculopathy is improved within 6 weeks without any intervention.  If the pain continues to persist we will next proceed with an MRI however in the current time I will start the patient on diclofenac 75 mg twice daily and use Soma 350 mg every 8 hours as needed for muscle spasms.  I will also use this to treat what I believe is most likely a strained muscle in his left posterior shoulder.  He has a negative empty can sign today.  He has negative Hoffmann sign.  He has no pain with abduction.  Therefore I do not feel that this is a rotator cuff issue.  I feel that this is likely either trapezius or serratus anterior muscle strain.

## 2021-09-18 ENCOUNTER — Other Ambulatory Visit: Payer: Self-pay | Admitting: Family Medicine

## 2021-09-18 MED ORDER — TIZANIDINE HCL 4 MG PO TABS: 4.0000 mg | ORAL_TABLET | Freq: Four times a day (QID) | ORAL | 0 refills | Status: DC | PRN

## 2021-09-30 DIAGNOSIS — M5412 Radiculopathy, cervical region: Secondary | ICD-10-CM | POA: Diagnosis not present

## 2021-09-30 DIAGNOSIS — Z0100 Encounter for examination of eyes and vision without abnormal findings: Secondary | ICD-10-CM | POA: Diagnosis not present

## 2021-09-30 DIAGNOSIS — H524 Presbyopia: Secondary | ICD-10-CM | POA: Diagnosis not present

## 2021-10-01 ENCOUNTER — Telehealth: Payer: Self-pay | Admitting: Pharmacist

## 2021-10-01 NOTE — Progress Notes (Signed)
? ? ?Chronic Care Management ?Pharmacy Assistant  ? ?Name: Todd Lawrence  MRN: [id number] DOB: [birth year] ? ? ?Reason for Encounter: Disease State - General Adherence Call ?  ? ?Recent office visits:  ?09/08/21 Jenna Luo, MD - Family Medicine - DDD - carisoprodol (SOMA) 350 MG tablet and diclofenac (VOLTAREN) 75 MG EC tablet prescribed. If pain continues more than 6 weeks will plan to obtain MRI. Follow up as scheduled.  ? ?08/31/21 Jenna Luo, MD - Family Medicine - DDD - Prednisone pack prescribed. For possible herniated disc. Follow up in 1 week. ? ? ?Recent consult visits:  ?08/27/21 Juanita Craver , MD - GI - Exocrine pancreatic insufficieny - No notes available.  ? ? ?Hospital visits:  ?Medication Reconciliation was completed by comparing discharge summary, patient?s EMR and Pharmacy list, and upon discussion with patient. ?  ?Admitted to the hospital on 07/24/21 due to Endoscopy. Discharge date was 07/24/21. Discharged from [facility].   ?  ?New?Medications Started at [facility] Discharge:?? ?None noted.  ?  ?Medication Changes at Hospital Discharge: ?None noted.  ?  ?Medications Discontinued at Hospital Discharge: ?None noted.  ?  ?Medications that remain the same after Hospital Discharge:??  ?All other medications will remain the same.  .   ? ? ?Medications: ?Outpatient Encounter Medications as of 10/01/2021  ?Medication Sig Note  ? aspirin EC 81 MG tablet Take 81 mg by mouth daily. Swallow whole.   ? atorvastatin (LIPITOR) 20 MG tablet TAKE 1 TABLET BY MOUTH EVERY DAY   ? carisoprodol (SOMA) 350 MG tablet Take 1 tablet (350 mg total) by mouth 4 (four) times daily as needed for muscle spasms.   ? CREON 36000-114000 units CPEP capsule Take 36,000 Units by mouth 3 (three) times daily with meals. 07/14/2021: SAMPLE MED   ? diclofenac (VOLTAREN) 75 MG EC tablet Take 1 tablet (75 mg total) by mouth 2 (two) times daily.   ? hypromellose (SYSTANE OVERNIGHT THERAPY) 0.3 % GEL ophthalmic ointment Place 1  application into both eyes at bedtime.   ? ketotifen (ZADITOR) 0.025 % ophthalmic solution Place 1 drop into both eyes 2 (two) times daily.   ? metFORMIN (GLUCOPHAGE) 1000 MG tablet Take 1 tablet (1,000 mg total) by mouth 2 (two) times daily with a meal.   ? Misc Natural Products (OSTEO BI-FLEX ADV JOINT SHIELD PO) Take 1 tablet by mouth in the morning and at bedtime.   ? Multiple Vitamin (MULTIVITAMIN WITH MINERALS) TABS tablet Take 1 tablet by mouth daily.   ? omeprazole (PRILOSEC) 40 MG capsule TAKE 1 CAPSULE BY MOUTH EVERY DAY (Patient taking differently: Take 40 mg by mouth daily as needed (acid reflux).)   ? pioglitazone (ACTOS) 30 MG tablet Take 1 tablet (30 mg total) by mouth daily.   ? predniSONE (DELTASONE) 20 MG tablet 3 tabs poqday 1-2, 2 tabs poqday 3-4, 1 tab poqday 5-6   ? predniSONE (DELTASONE) 20 MG tablet 3 tabs poqday 1-2, 2 tabs poqday 3-4, 1 tab poqday 5-6   ? Propylene Glycol (SYSTANE BALANCE) 0.6 % SOLN Place 1 drop into both eyes in the morning and at bedtime.   ? tiZANidine (ZANAFLEX) 4 MG tablet Take 1 tablet (4 mg total) by mouth every 6 (six) hours as needed for muscle spasms.   ? ?No facility-administered encounter medications on file as of 10/01/2021.  ? ?Have you had any problems recently with your health? ?Patient reported some issues with back pain from possible herniated disc.  ?  ?  Have you had any problems with your pharmacy? ?Patient denied any issues with his current pharmacy.  ?  ?What issues or side effects are you having with your medications? ?Patient denied any side effects or issues with his current medications.  ?  ?What would you like me to pass along to Leata Mouse, CPP for them to help you with?  ?Patient did not have anything additional to pass along to CPP at this time.  ?  ?What can we do to take care of you better? ?Patient did not have any recommendations at this time.  ? ?  ?Care Gaps ?  ?AWV: done 12/12/20  ?Colonoscopy: done 03/28/19 ?DM Eye Exam: due 03/31/22 ?DM  Foot Exam: due 01/16/22 ?Microalbumin: done 01/02/21 ?HbgAIC: done 04/20/21 (6.8) ?DEXA: N/A ?Mammogram: N/A ?  ?  ?Star Rating Drugs: ?pioglitazone (ACTOS) 30 MG tablet - last filled 07/15/21 30 days  ?metFORMIN (GLUCOPHAGE) 1000 MG tablet - last filled 08/18/21 90 days  ?atorvastatin (LIPITOR) 20 MG tablet - last filled 09/12/21 90 days  ? ? ?No future appointments. ? ? ?Liza Showfety, CCMA ?Clinical Pharmacist Assistant  ?[phone] ? ? ?

## 2021-10-13 ENCOUNTER — Encounter: Payer: Self-pay | Admitting: Family Medicine

## 2021-10-14 NOTE — Telephone Encounter (Signed)
Spoke with patient to advise letter is at the front desk and ready for pickup. Also advised patient of variation in office hours this week due to the holiday.  ?

## 2021-11-05 ENCOUNTER — Telehealth: Payer: Medicare HMO

## 2021-11-16 ENCOUNTER — Ambulatory Visit (INDEPENDENT_AMBULATORY_CARE_PROVIDER_SITE_OTHER): Payer: Medicare HMO | Admitting: Family Medicine

## 2021-11-16 VITALS — BP 132/92 | HR 87 | Temp 97.8°F | Ht 70.0 in | Wt 167.8 lb

## 2021-11-16 DIAGNOSIS — M48061 Spinal stenosis, lumbar region without neurogenic claudication: Secondary | ICD-10-CM

## 2021-11-16 DIAGNOSIS — M5136 Other intervertebral disc degeneration, lumbar region: Secondary | ICD-10-CM

## 2021-11-16 DIAGNOSIS — E119 Type 2 diabetes mellitus without complications: Secondary | ICD-10-CM

## 2021-11-16 NOTE — Progress Notes (Signed)
? ?Subjective:  ? ? Patient ID: Todd Lawrence, male    DOB: [birth year], 71 y.o.   MRN: [id number] ? ?Back Pain ?08/31/21 ?Patient presents today complaining of back pain.  Patient had an MRI of the lumbar spine in 2017.  Below I have included the relevant portions of the MRI report. ?"L3-4: Mild disc bulging and mild facet and ligamentum flavum ?hypertrophy result in minimal bilateral neural foraminal stenosis ?without significant spinal stenosis. ?  ?L4-5: Moderately large central disc protrusion and mild facet ?hypertrophy result in moderate spinal stenosis and moderate ?bilateral neural foraminal stenosis. Mild disc bulging contributes ?to minimal bilateral neural foraminal stenosis. ?  ?L5-S1: Small central disc protrusion with annular fissure may ?contact the S1 nerve roots bilaterally without frank neural ?compression. At most mild left lateral recess stenosis. Mild disc ?bulging results in minimal bilateral neural foraminal narrowing. No ?spinal stenosis" ? 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  Ever since that time he has had pain in the center of his lower back roughly at the level of L4-L5.  If he hyperextends his back he will have sharp severe pain in the center of his back and pain that radiates down his right leg.  He reports numbness and tingling in his right leg.  He also reports pain radiating into his right gluteus and down his right hamstring.  He denies any saddle anesthesia or bowel or bladder incontinence.  He denies any leg weakness.  However he states that when he was walking yesterday, his legs felt weak and he felt like he had to sit down because he just did not have the strength to keep walking.  At that time, my plan was: ? ?I suspect that the moderate spinal stenosis due to the bulging disc at L4-L5 from 2017 has worsened acutely due to a herniated disc that the patient recently suffered while helping to change a tire.  He has tried muscle  relaxers with no relief.  Begin a prednisone taper pack for what I suspect is a herniated disc.  If not improving, the next step would be an MRI of the lumbar spine to evaluate further. ? ?09/08/21 ?Patient continues to have pain with hyperextension of the back roughly around the level of L4-L5.  He continues to have occasional radiating pain going down his right leg and some numbness and tingling in his right leg.  This is slightly better from when I saw him a week ago including on prednisone.  However that is not his primary concern today.  Today he has a pain in his posterior left shoulder.  It is just medial to his shoulder blade.  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  Gradually over the weekend he developed this pain in his posterior left shoulder area.  It hurts to move his left arm behind his head.  It aches to lay on his left side at night.  He is tender to palpation in the area that his diagram.  He also occasionally has pain in his left axilla near the insertion of the pectoralis muscle.  If he presses in that area the pain feels better.  If he lifts his arm over his head the pain feels better.  He denies any pain radiating into his jaw.  He denies any chest pain or shortness of breath or dyspnea on exertion.  He denies any crushing substernal chest pain.  His wife was concerned because a family friend died after having pain radiating into his back.  I suspect that he had an aortic aneurysm or dissection of the aorta.  However the patient's pain today is very superficial and reproducible with palpation and movement.  At that time, my plan was: ?Patient continues to have pain in his lower back although it may be slightly better.  I recommended continued tincture of time.  I explained that the majority of lumbar radiculopathy is improved within 6 weeks without any intervention.  If the pain continues to persist we will next proceed with an MRI however in the  current time I will start the patient on diclofenac 75 mg twice daily and use Soma 350 mg every 8 hours as needed for muscle spasms.  I will also use this to treat what I believe is most likely a strained muscle in his left posterior shoulder.  He has a negative empty can sign today.  He has negative Hawking's sign.  He has no pain with abduction.  Therefore I do not feel that this is a rotator cuff issue.  I feel that this is likely either trapezius or serratus anterior muscle strain. ? ? ?11/16/21 ?Patient continues to have pain in his lower back roughly around the level of L4 and L5.  He states that if he tries to extend his back, he will feel nervelike pain radiating down both legs.  He continues to have numbness and tingling in both legs.  He also reports occasional leg weakness although today is muscle strength is 5/5 equal and symmetric bilaterally.  He denies any saddle anesthesia.  He denies any bowel or bladder incontinence.  The pain is moderate however it is made worse by walking and standing straight up.  If he leans forward, the pain improves.  Pain pattern is consistent with neurogenic claudication consistent with spinal stenosis. ?Past Medical History:  ?Diagnosis Date  ? Acute meniscal tear of left knee   ? DDD (degenerative disc disease), lumbar   ? has received ESI x 3  ? Diabetes mellitus without complication ([place])   ? GERD (gastroesophageal reflux disease)   ? History of blood transfusion   ? History of staph infection   ? after thumb surgery  ? Hyperlipidemia   ? Left rotator cuff tear   ? Primary localized osteoarthritis of left knee   ? ?Past Surgical History:  ?Procedure Laterality Date  ? CERVICAL FUSION    ? COLONOSCOPY    ? ESOPHAGOGASTRODUODENOSCOPY (EGD) WITH PROPOFOL N/A 07/24/2021  ? Procedure: ESOPHAGOGASTRODUODENOSCOPY (EGD) WITH PROPOFOL;  Surgeon: Carol Ada, MD;  Location: [facility];  Service: Endoscopy;  Laterality: N/A;  ? FOOT SURGERY Right   ? JOINT REPLACEMENT N/A   ?  [employer] 12/02/2019  ? KNEE ARTHROSCOPY Bilateral   ? SHOULDER ARTHROSCOPY Left   ? THUMB AMPUTATION    ? left after table saw accident  ? TOTAL KNEE ARTHROPLASTY Right 02/26/2019  ? Procedure: TOTAL KNEE ARTHROPLASTY;  Surgeon: Elsie Saas, MD;  Location: [facility];  Service: Orthopedics;  Laterality: Right;  ? UPPER ESOPHAGEAL ENDOSCOPIC ULTRASOUND (EUS) N/A 07/24/2021  ? Procedure: UPPER ESOPHAGEAL ENDOSCOPIC ULTRASOUND (EUS);  Surgeon: Carol Ada, MD;  Location: [facility];  Service: Endoscopy;  Laterality: N/A;  ? ? ? ?Current Outpatient Medications on File Prior to Visit  ?Medication Sig Dispense Refill  ? aspirin EC 81 MG tablet Take 81 mg by mouth daily. Swallow whole.    ? atorvastatin (LIPITOR) 20  MG tablet TAKE 1 TABLET BY MOUTH EVERY DAY 90 tablet 3  ? carisoprodol (SOMA) 350 MG tablet Take 1 tablet (350 mg total) by mouth 4 (four) times daily as needed for muscle spasms. 30 tablet 0  ? CREON 36000-114000 units CPEP capsule Take 36,000 Units by mouth 3 (three) times daily with meals.    ? diclofenac (VOLTAREN) 75 MG EC tablet Take 1 tablet (75 mg total) by mouth 2 (two) times daily. 60 tablet 0  ? hypromellose (SYSTANE OVERNIGHT THERAPY) 0.3 % GEL ophthalmic ointment Place 1 application into both eyes at bedtime.    ? ketotifen (ZADITOR) 0.025 % ophthalmic solution Place 1 drop into both eyes 2 (two) times daily.    ? metFORMIN (GLUCOPHAGE) 1000 MG tablet Take 1 tablet (1,000 mg total) by mouth 2 (two) times daily with a meal. 180 tablet 3  ? Misc Natural Products (OSTEO BI-FLEX ADV JOINT SHIELD PO) Take 1 tablet by mouth in the morning and at bedtime.    ? Multiple Vitamin (MULTIVITAMIN WITH MINERALS) TABS tablet Take 1 tablet by mouth daily.    ? omeprazole (PRILOSEC) 40 MG capsule TAKE 1 CAPSULE BY MOUTH EVERY DAY (Patient taking differently: Take 40 mg by mouth daily as needed (acid reflux).) 30 capsule 11  ? pioglitazone (ACTOS) 30 MG tablet Take 1 tablet (30 mg total) by mouth daily. 30 tablet  11  ? predniSONE (DELTASONE) 20 MG tablet 3 tabs poqday 1-2, 2 tabs poqday 3-4, 1 tab poqday 5-6 12 tablet 0  ? predniSONE (DELTASONE) 20 MG tablet 3 tabs poqday 1-2, 2 tabs poqday 3-4, 1 tab poqday 5-6 12 tablet

## 2021-11-17 ENCOUNTER — Other Ambulatory Visit: Payer: Self-pay

## 2021-11-17 DIAGNOSIS — R7309 Other abnormal glucose: Secondary | ICD-10-CM

## 2021-11-17 LAB — COMPLETE METABOLIC PANEL WITH GFR
AG Ratio: 1.8 (calc) (ref 1.0–2.5)
ALT: 13 U/L (ref 9–46)
AST: 20 U/L (ref 10–35)
Albumin: 4.4 g/dL (ref 3.6–5.1)
Alkaline phosphatase (APISO): 55 U/L (ref 35–144)
BUN: 16 mg/dL (ref 7–25)
CO2: 26 mmol/L (ref 20–32)
Calcium: 9.6 mg/dL (ref 8.6–10.3)
Chloride: 98 mmol/L (ref 98–110)
Creat: 0.82 mg/dL (ref 0.70–1.28)
Globulin: 2.5 g/dL (calc) (ref 1.9–3.7)
Glucose, Bld: 156 mg/dL — ABNORMAL HIGH (ref 65–99)
Potassium: 4.6 mmol/L (ref 3.5–5.3)
Sodium: 135 mmol/L (ref 135–146)
Total Bilirubin: 0.4 mg/dL (ref 0.2–1.2)
Total Protein: 6.9 g/dL (ref 6.1–8.1)
eGFR: 93 mL/min/{1.73_m2} (ref >=60)

## 2021-11-17 LAB — HEMOGLOBIN A1C
Hgb A1c MFr Bld: 7.3 % of total Hgb — ABNORMAL HIGH (ref <5.7)
Mean Plasma Glucose: 163 mg/dL
eAG (mmol/L): 9 mmol/L

## 2021-11-17 LAB — CBC WITH DIFFERENTIAL/PLATELET
Absolute Monocytes: 577 cells/uL (ref 200–950)
Basophils Absolute: 42 cells/uL (ref 0–200)
Basophils Relative: 0.8 %
Eosinophils Absolute: 198 cells/uL (ref 15–500)
Eosinophils Relative: 3.8 %
HCT: 39.3 % (ref 38.5–50.0)
Hemoglobin: 13 g/dL — ABNORMAL LOW (ref 13.2–17.1)
Lymphs Abs: 1243 cells/uL (ref 850–3900)
MCH: 31.2 pg (ref 27.0–33.0)
MCHC: 33.1 g/dL (ref 32.0–36.0)
MCV: 94.2 fL (ref 80.0–100.0)
MPV: 11.2 fL (ref 7.5–12.5)
Monocytes Relative: 11.1 %
Neutro Abs: 3141 cells/uL (ref 1500–7800)
Neutrophils Relative %: 60.4 %
Platelets: 220 10*3/uL (ref 140–400)
RBC: 4.17 10*6/uL — ABNORMAL LOW (ref 4.20–5.80)
RDW: 13.3 % (ref 11.0–15.0)
Total Lymphocyte: 23.9 %
WBC: 5.2 10*3/uL (ref 3.8–10.8)

## 2021-11-17 MED ORDER — SITAGLIPTIN PHOSPHATE 100 MG PO TABS: 100.0000 mg | ORAL_TABLET | Freq: Every day | ORAL | 3 refills | Status: DC

## 2021-11-19 ENCOUNTER — Telehealth: Payer: Self-pay

## 2021-11-19 NOTE — Telephone Encounter (Signed)
Spoke with patient's wife, Todd Lawrence (DPR), and she states they would prefer not to add another medication at this time. They are also concerned about the cost of this rx. ?She would like to know if patient can change his diet and try to manage DM this way. ? ?Please advise, thanks! ?

## 2021-11-19 NOTE — Telephone Encounter (Signed)
Patient called to report the rx for Januvia is $141. He is not able to afford this. ? ?Spoke with [facility], they state this is for 90D supply. Price for 30D supply is $47.  ? ?LMTRC to advise patient or see if he would like to have his rx changed if possible ?

## 2021-11-20 NOTE — Telephone Encounter (Signed)
Spoke with patient and he states he has been "cheating" in his eating habits. He would like to try to go back to what he was doing previously, in terms of his eating, and see if this helps. Advised patient we can recheck his A1C in 3 months to see how it is progressing. Patient will call for lab appt. Nothing further needed at this time.  ? ?

## 2021-11-24 ENCOUNTER — Ambulatory Visit
Admission: RE | Admit: 2021-11-24 | Discharge: 2021-11-24 | Disposition: A | Discharge status: Home / Self Care | Payer: Medicare HMO | Source: Ambulatory Visit | Attending: Family Medicine | Admitting: Family Medicine

## 2021-11-24 DIAGNOSIS — M5136 Other intervertebral disc degeneration, lumbar region: Secondary | ICD-10-CM

## 2021-11-24 DIAGNOSIS — M5416 Radiculopathy, lumbar region: Secondary | ICD-10-CM | POA: Diagnosis not present

## 2021-11-24 DIAGNOSIS — M5127 Other intervertebral disc displacement, lumbosacral region: Secondary | ICD-10-CM | POA: Diagnosis not present

## 2021-11-24 DIAGNOSIS — M48061 Spinal stenosis, lumbar region without neurogenic claudication: Secondary | ICD-10-CM

## 2021-11-27 ENCOUNTER — Other Ambulatory Visit: Payer: Self-pay

## 2021-11-27 DIAGNOSIS — M48061 Spinal stenosis, lumbar region without neurogenic claudication: Secondary | ICD-10-CM

## 2021-12-04 ENCOUNTER — Other Ambulatory Visit: Payer: Self-pay | Admitting: Family Medicine

## 2021-12-04 DIAGNOSIS — M48061 Spinal stenosis, lumbar region without neurogenic claudication: Secondary | ICD-10-CM

## 2021-12-09 ENCOUNTER — Ambulatory Visit
Admission: RE | Admit: 2021-12-09 | Discharge: 2021-12-09 | Disposition: A | Discharge status: Home / Self Care | Payer: Medicare HMO | Source: Ambulatory Visit | Attending: Family Medicine | Admitting: Family Medicine

## 2021-12-09 ENCOUNTER — Other Ambulatory Visit: Payer: Self-pay | Admitting: Family Medicine

## 2021-12-09 DIAGNOSIS — M48061 Spinal stenosis, lumbar region without neurogenic claudication: Secondary | ICD-10-CM

## 2021-12-09 DIAGNOSIS — M47817 Spondylosis without myelopathy or radiculopathy, lumbosacral region: Secondary | ICD-10-CM | POA: Diagnosis not present

## 2021-12-09 MED ORDER — METHYLPREDNISOLONE ACETATE 40 MG/ML INJ SUSP (RADIOLOG
80.0000 mg | Freq: Once | INTRAMUSCULAR | Status: AC
  Administered 2021-12-09: 80 mg via EPIDURAL

## 2021-12-09 MED ORDER — IOPAMIDOL (ISOVUE-M 200) INJECTION 41%
1.0000 mL | Freq: Once | INTRAMUSCULAR | Status: AC
Start: 2021-12-09 — End: 2021-12-09
  Administered 2021-12-09: 1 mL via EPIDURAL

## 2021-12-09 NOTE — Discharge Instructions (Signed)

## 2021-12-09 NOTE — Telephone Encounter (Signed)
Requested Prescriptions  Pending Prescriptions Disp Refills  . atorvastatin (LIPITOR) 20 MG tablet [Pharmacy Med Name: ATORVASTATIN 20 MG TABLET] 90 tablet 0    Sig: TAKE 1 TABLET BY MOUTH EVERY DAY     Cardiovascular:  Antilipid - Statins Failed - 12/09/2021  1:47 AM      Failed - Lipid Panel in normal range within the last 12 months    Cholesterol  Date Value Ref Range Status  01/02/2021 188 <200 mg/dL Final   LDL Cholesterol (Calc)  Date Value Ref Range Status  01/02/2021 97 mg/dL (calc) Final    Comment:    Reference range: <100 . Desirable range <100 mg/dL for primary prevention;   <70 mg/dL for patients with CHD or diabetic patients  with > or = 2 CHD risk factors. Marland Kitchen LDL-C is now calculated using the Martin-Hopkins  calculation, which is a validated novel method providing  better accuracy than the Friedewald equation in the  estimation of LDL-C.  Cresenciano Genre et al. Annamaria Helling. 3354;562(56): 2061-2068  (http://education.[facility].com/faq/FAQ164)    HDL  Date Value Ref Range Status  01/02/2021 78 > OR = 40 mg/dL Final   Triglycerides  Date Value Ref Range Status  01/02/2021 43 <150 mg/dL Final         Passed - Patient is not pregnant      Passed - Valid encounter within last 12 months    Recent Outpatient Visits          3 weeks ago DDD (degenerative disc disease), lumbar   [place] [name], MD   3 months ago DDD (degenerative disc disease), lumbar   [place] [name], MD   3 months ago DDD (degenerative disc disease), lumbar   [place] [name], MD   7 months ago Weight loss   [place] Pickard, Cammie Mcgee, MD   10 months ago Controlled type 2 diabetes mellitus without complication, without long-term current use of insulin ([place])   [facility] Pickard, Cammie Mcgee, MD

## 2021-12-11 DIAGNOSIS — Z8249 Family history of ischemic heart disease and other diseases of the circulatory system: Secondary | ICD-10-CM | POA: Diagnosis not present

## 2021-12-11 DIAGNOSIS — K8681 Exocrine pancreatic insufficiency: Secondary | ICD-10-CM | POA: Diagnosis not present

## 2021-12-11 DIAGNOSIS — Z7982 Long term (current) use of aspirin: Secondary | ICD-10-CM | POA: Diagnosis not present

## 2021-12-11 DIAGNOSIS — E119 Type 2 diabetes mellitus without complications: Secondary | ICD-10-CM | POA: Diagnosis not present

## 2021-12-11 DIAGNOSIS — Z89022 Acquired absence of left finger(s): Secondary | ICD-10-CM | POA: Diagnosis not present

## 2021-12-11 DIAGNOSIS — R030 Elevated blood-pressure reading, without diagnosis of hypertension: Secondary | ICD-10-CM | POA: Diagnosis not present

## 2021-12-11 DIAGNOSIS — E785 Hyperlipidemia, unspecified: Secondary | ICD-10-CM | POA: Diagnosis not present

## 2021-12-11 DIAGNOSIS — G8929 Other chronic pain: Secondary | ICD-10-CM | POA: Diagnosis not present

## 2021-12-11 DIAGNOSIS — Z7984 Long term (current) use of oral hypoglycemic drugs: Secondary | ICD-10-CM | POA: Diagnosis not present

## 2022-02-11 ENCOUNTER — Other Ambulatory Visit: Payer: Self-pay | Admitting: Family Medicine

## 2022-02-11 DIAGNOSIS — R140 Abdominal distension (gaseous): Secondary | ICD-10-CM | POA: Diagnosis not present

## 2022-02-11 DIAGNOSIS — K5904 Chronic idiopathic constipation: Secondary | ICD-10-CM | POA: Diagnosis not present

## 2022-02-11 DIAGNOSIS — K8681 Exocrine pancreatic insufficiency: Secondary | ICD-10-CM | POA: Diagnosis not present

## 2022-02-11 NOTE — Telephone Encounter (Signed)
Requested Prescriptions  Pending Prescriptions Disp Refills  . metFORMIN (GLUCOPHAGE) 1000 MG tablet [Pharmacy Med Name: METFORMIN HCL 1,000 MG TABLET] 180 tablet 3    Sig: TAKE 1 TABLET (1,000 MG TOTAL) BY MOUTH 2 (TWO) TIMES DAILY WITH A MEAL.     Endocrinology:  Diabetes - Biguanides Failed - 02/11/2022  2:37 AM      Failed - B12 Level in normal range and within 720 days    No results found for: "VITAMINB12"       Passed - Cr in normal range and within 360 days    Creat  Date Value Ref Range Status  11/16/2021 0.82 0.70 - 1.28 mg/dL Final   Creatinine, Urine  Date Value Ref Range Status  01/02/2021 12 (L) 20 - 320 mg/dL Final         Passed - HBA1C is between 0 and 7.9 and within 180 days    Hgb A1c MFr Bld  Date Value Ref Range Status  11/16/2021 7.3 (H) <5.7 % of total Hgb Final    Comment:    For someone without known diabetes, a hemoglobin A1c value of 6.5% or greater indicates that they may have  diabetes and this should be confirmed with a follow-up  test. . For someone with known diabetes, a value <7% indicates  that their diabetes is well controlled and a value  greater than or equal to 7% indicates suboptimal  control. A1c targets should be individualized based on  duration of diabetes, age, comorbid conditions, and  other considerations. . Currently, no consensus exists regarding use of hemoglobin A1c for diagnosis of diabetes for children. .          Passed - eGFR in normal range and within 360 days    GFR, Est African American  Date Value Ref Range Status  01/02/2021 101 > OR = 60 mL/min/1.24m Final   GFR, Est Non African American  Date Value Ref Range Status  01/02/2021 87 > OR = 60 mL/min/1.754mFinal   eGFR  Date Value Ref Range Status  11/16/2021 93 > OR = 60 mL/min/1.7367minal    Comment:    The eGFR is based on the CKD-EPI 2021 equation. To calculate  the new eGFR from a previous Creatinine or Cystatin C result, go to  https://www.kidney.org/professionals/ kdoqi/gfr%5Fcalculator          Passed - Valid encounter within last 6 months    Recent Outpatient Visits          2 months ago DDD (degenerative disc disease), lumbar   [place] [facility]   5 months ago DDD (degenerative disc disease), lumbar   [place] [facility]   5 months ago DDD (degenerative disc disease), lumbar   [place] [facility] Mcge[facility]   9 months ago Weight loss   [place]   1 year ago Controlled type 2 diabetes mellitus without complication, without long-term current use of insulin ([place], [place] Mcgee[place]             Passed - CBC within normal limits and completed in the last 12 months    WBC  Date Value Ref Range Status  11/16/2021 5.2 3.8 - 10.8 Thousand/uL Final   RBC  Date Value Ref Range Status  11/16/2021 4.17 (L) 4.20 - 5.80 Million/uL Final   Hemoglobin  Date  Value Ref Range Status  11/16/2021 13.0 (L) 13.2 - 17.1 g/dL Final   HCT  Date Value Ref Range Status  11/16/2021 39.3 38.5 - 50.0 % Final   MCHC  Date Value Ref Range Status  11/16/2021 33.1 32.0 - 36.0 g/dL Final   [facility]  Date Value Ref Range Status  11/16/2021 31.2 27.0 - 33.0 pg Final   MCV  Date Value Ref Range Status  11/16/2021 94.2 80.0 - 100.0 fL Final   No results found for: "PLTCOUNTKUC", "LABPLAT", "POCPLA" RDW  Date Value Ref Range Status  11/16/2021 13.3 11.0 - 15.0 % Final

## 2022-02-22 ENCOUNTER — Ambulatory Visit (HOSPITAL_COMMUNITY)
Admission: RE | Admit: 2022-02-22 | Discharge: 2022-02-22 | Disposition: A | Discharge status: Home / Self Care | Payer: Medicare HMO | Source: Ambulatory Visit | Attending: Surgery | Admitting: Surgery

## 2022-02-22 ENCOUNTER — Other Ambulatory Visit (HOSPITAL_COMMUNITY): Payer: Self-pay | Admitting: Orthopedic Surgery

## 2022-02-22 DIAGNOSIS — M7989 Other specified soft tissue disorders: Secondary | ICD-10-CM | POA: Diagnosis not present

## 2022-02-22 DIAGNOSIS — M79605 Pain in left leg: Secondary | ICD-10-CM | POA: Insufficient documentation

## 2022-02-22 DIAGNOSIS — M1712 Unilateral primary osteoarthritis, left knee: Secondary | ICD-10-CM | POA: Diagnosis not present

## 2022-03-05 ENCOUNTER — Ambulatory Visit (INDEPENDENT_AMBULATORY_CARE_PROVIDER_SITE_OTHER): Payer: Medicare HMO | Admitting: Family Medicine

## 2022-03-05 VITALS — BP 122/76 | HR 82 | Temp 97.6°F | Ht 70.0 in | Wt 162.0 lb

## 2022-03-05 DIAGNOSIS — J019 Acute sinusitis, unspecified: Secondary | ICD-10-CM | POA: Diagnosis not present

## 2022-03-05 MED ORDER — AMOXICILLIN 875 MG PO TABS: 875.0000 mg | ORAL_TABLET | Freq: Two times a day (BID) | ORAL | 0 refills | Status: AC

## 2022-03-05 MED ORDER — TRIAMCINOLONE ACETONIDE 55 MCG/ACT NA AERO: 2.0000 | INHALATION_SPRAY | Freq: Every day | NASAL | 12 refills | Status: DC

## 2022-03-05 NOTE — Progress Notes (Signed)
Subjective:    Patient ID: Todd Lawrence, male    DOB: [birth year], 71 y.o.   MRN: [id number]  Cough  Patient states that his for 2 to 3 weeks.  He has postnasal drip and some drainage.  He denies any fevers or chills.  He denies any chest pain.  He denies any shortness of breath.  He denies any purulent sputum.  He denies any sinus pain or sinus pressure.  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 breathing and grass clippings and dry environment for the last few weeks Past Medical History:  Diagnosis Date   Acute meniscal tear of left knee    DDD (degenerative disc disease), lumbar    has received ESI x 3   Diabetes mellitus without complication ([place])    GERD (gastroesophageal reflux disease)    History of blood transfusion    History of staph infection    after thumb surgery   Hyperlipidemia    Left rotator cuff tear    Primary localized osteoarthritis of left knee    Past Surgical History:  Procedure Laterality Date   CERVICAL FUSION     COLONOSCOPY     ESOPHAGOGASTRODUODENOSCOPY (EGD) WITH PROPOFOL N/A 07/24/2021   Procedure: ESOPHAGOGASTRODUODENOSCOPY (EGD) WITH PROPOFOL;  Surgeon: Carol Ada, MD;  Location: [facility];  Service: Endoscopy;  Laterality: N/A;   FOOT SURGERY Right    JOINT REPLACEMENT N/A    Phreesia 12/02/2019   KNEE ARTHROSCOPY Bilateral    SHOULDER ARTHROSCOPY Left    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 Swallow whole.      atorvastatin (LIPITOR) 20 MG tablet TAKE 1 TABLET BY MOUTH EVERY DAY 90 tablet 0   CREON 36000-114000 units CPEP capsule Take 36,000 Units by mouth 3 (three) times daily with meals.     hypromellose (SYSTANE OVERNIGHT THERAPY) 0.3 % GEL ophthalmic ointment Place 1 application into both eyes at bedtime.     ketotifen (ZADITOR) 0.025 % ophthalmic solution Place 1 drop into both eyes 2 (two) times daily.     metFORMIN (GLUCOPHAGE) 1000 MG tablet TAKE 1 TABLET (1,000 MG TOTAL) BY MOUTH 2 (TWO) TIMES DAILY WITH A MEAL. 180 tablet 3   Misc Natural Products (OSTEO BI-FLEX ADV JOINT SHIELD PO) Take 1 tablet by mouth in the morning and at bedtime.     Multiple Vitamin (MULTIVITAMIN WITH MINERALS) TABS tablet Take 1 tablet by mouth daily.     omeprazole (PRILOSEC) 40 MG capsule TAKE 1 CAPSULE BY MOUTH EVERY DAY (Patient taking differently: Take 40 mg by mouth daily as needed (acid reflux).) 30 capsule 11   pioglitazone (ACTOS) 30 MG tablet Take 1 tablet (30 mg total) by mouth daily. 30 tablet 11   Propylene Glycol (SYSTANE BALANCE) 0.6 % SOLN Place 1 drop into both eyes in the morning and at bedtime.     sitaGLIPtin (JANUVIA) 100 MG tablet Take 1 tablet (100 mg total) by mouth daily. [place]  tablet 3   No current facility-administered medications on file prior to visit.   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  Other Topics Concern   Not on file  Social History Narrative   Not on file   Social Determinants of Health    Financial Resource Strain: [place]  (12/12/2020)   Overall Financial Resource Strain (CARDIA)    Difficulty of Paying Living Expenses: Not hard at all  Food Insecurity: No [place] (12/12/2020)   Hunger Vital Sign    Worried About Running Out of Food in the Last Year: Never true    [place] in the Last Year: Never true  Transportation Needs: No Transportation Needs (12/12/2020)   PRAPARE - Hydrologist (Medical): No    Lack of Transportation (Non-Medical): No  Physical Activity: Inactive (12/12/2020)   Exercise Vital Sign    Days of Exercise per Week: 0 days    Minutes of Exercise per Session: 0 min  Stress: No Stress Concern Present (12/12/2020)   [place]    Feeling of Stress : Not at all  Social Connections: Moderately Integrated (12/12/2020)   Social Connection and Isolation Panel [NHANES]    Frequency of Communication with Friends and Family: Three times a week    Frequency of Social Gatherings with Friends and Family: More than three times a week    Attends Religious Services: More than 4 times per year    Active Member of [employer] or Organizations: No    Attends Archivist Meetings: Never    Marital Status: Married  Human resources officer Violence: Not At Risk (12/12/2020)   Humiliation, Afraid, Rape, and Kick questionnaire    Fear of Current or Ex-Partner: No    Emotionally Abused: No    Physically Abused: No    Sexually Abused: No   Family History  Problem Relation Age of Onset   Heart disease Father    Asthma Sister    Arthritis Sister      Review of Systems  Respiratory:  Positive for cough.   All other systems reviewed and are negative.      Objective:   Physical Exam Vitals reviewed.  Constitutional:      General: He is not in acute distress.    Appearance: He is well-developed. He is not diaphoretic.  HENT:     Head: Normocephalic and atraumatic.      Right Ear: External ear normal.     Left Ear: External ear normal.     Nose: Nose normal.     Right Turbinates: Enlarged, swollen and pale.     Left Turbinates: Enlarged, swollen and pale.     Right Sinus: No maxillary sinus tenderness or frontal sinus tenderness.     Left Sinus: No maxillary sinus tenderness or frontal sinus tenderness.  Mouth/Throat:     Pharynx: No oropharyngeal exudate.  Eyes:     General: No scleral icterus.       Right eye: No discharge.        Left eye: No discharge.     Conjunctiva/sclera: Conjunctivae normal.     Pupils: Pupils are equal, round, and reactive to light.  Neck:     Thyroid: No thyromegaly.     Vascular: No JVD.  Cardiovascular:     Rate and Rhythm: Normal rate and regular rhythm.     Heart sounds: Normal heart sounds. No murmur heard.    No friction rub. No gallop.  Pulmonary:     Effort: Pulmonary effort is normal. No respiratory distress.     Breath sounds: Wheezing and rales present.  Chest:     Chest wall: No tenderness.  Abdominal:     General: Bowel sounds are normal. There is no distension.     Palpations: Abdomen is soft. There is no mass.     Tenderness: There is no abdominal tenderness. There is no guarding or rebound.  Genitourinary:    Penis: Normal.   Musculoskeletal:        General: No tenderness. Normal range of motion.     Cervical back: Normal range of motion and neck supple.  Lymphadenopathy:     Cervical: No cervical adenopathy.  Skin:    General: Skin is warm.     Coloration: Skin is not pale.     Findings: No erythema or rash.  Neurological:     Mental Status: He is alert and oriented to person, place, and time.     Cranial Nerves: No cranial nerve deficit.     Motor: No abnormal muscle tone.     Coordination: Coordination normal.     Deep Tendon Reflexes: Reflexes are normal and symmetric.  Psychiatric:        Behavior: Behavior normal.        Thought Content: Thought content normal.        Judgment:  Judgment normal.           Assessment & Plan:  Acute non-recurrent sinusitis, unspecified location Believe this cough is due to sinusitis most likely due to allergies.  I recommended Nasacort 2 sprays each nostril daily for 1 week.  If symptoms or not improving he can treat a sinus infection with amoxicillin but I truly believe this is more likely allergies causing postnasal drip causing his cough.  I see no evidence of bronchitis or pneumonia

## 2022-03-11 ENCOUNTER — Other Ambulatory Visit: Payer: Self-pay | Admitting: Family Medicine

## 2022-03-11 NOTE — Telephone Encounter (Signed)
Requested medication (s) are due for refill today: yes  Requested medication (s) are on the active medication list: yes  Last refill:  12/09/21 #90 with 0 RF  Future visit scheduled: no, seen 03/05/22  Notes to clinic:  Failed protocol of labs within 12 months, 12/2020, no upcoming appt, please assess.       Requested Prescriptions  Pending Prescriptions Disp Refills   atorvastatin (LIPITOR) 20 MG tablet [Pharmacy Med Name: ATORVASTATIN 20 MG TABLET] 90 tablet 0    Sig: TAKE 1 TABLET BY MOUTH EVERY DAY     Cardiovascular:  Antilipid - Statins Failed - 03/11/2022  3:01 AM      Failed - Lipid Panel in normal range within the last 12 months    Cholesterol  Date Value Ref Range Status  01/02/2021 188 <200 mg/dL Final   LDL Cholesterol (Calc)  Date Value Ref Range Status  01/02/2021 97 mg/dL (calc) Final    Comment:    Reference range: <100 . Desirable range <100 mg/dL for primary prevention;   <70 mg/dL for patients with CHD or diabetic patients  with > or = 2 CHD risk factors. Marland Kitchen LDL-C is now calculated using the Martin-Hopkins  calculation, which is a validated novel method providing  better accuracy than the Friedewald equation in the  estimation of LDL-C.  Cresenciano Genre et al. Annamaria Helling. 4076;808(81): 2061-2068  (http://education.[facility].com/faq/FAQ164)    HDL  Date Value Ref Range Status  01/02/2021 78 > OR = 40 mg/dL Final   Triglycerides  Date Value Ref Range Status  01/02/2021 43 <150 mg/dL Final         Passed - Patient is not pregnant      Passed - Valid encounter within last 12 months    Recent Outpatient Visits           3 months ago DDD (degenerative disc disease), lumbar   [place] [name], MD   6 months ago DDD (degenerative disc disease), lumbar   [place] [name], MD   6 months ago DDD (degenerative disc disease), lumbar   [place] Schaumann, Cammie Mcgee, MD   10 months  ago Weight loss   [place] Pickard, Cammie Mcgee, MD   1 year ago Controlled type 2 diabetes mellitus without complication, without long-term current use of insulin ([place])   [facility] Pickard, Cammie Mcgee, MD

## 2022-03-26 ENCOUNTER — Other Ambulatory Visit: Payer: Self-pay

## 2022-03-26 ENCOUNTER — Other Ambulatory Visit: Payer: Medicare HMO

## 2022-03-26 DIAGNOSIS — E119 Type 2 diabetes mellitus without complications: Secondary | ICD-10-CM

## 2022-03-27 LAB — HEMOGLOBIN A1C
Hgb A1c MFr Bld: 6.8 % of total Hgb — ABNORMAL HIGH (ref <5.7)
Mean Plasma Glucose: 148 mg/dL
eAG (mmol/L): 8.2 mmol/L

## 2022-04-05 ENCOUNTER — Ambulatory Visit
Admission: RE | Admit: 2022-04-05 | Discharge: 2022-04-05 | Disposition: A | Discharge status: Home / Self Care | Payer: Medicare HMO | Source: Ambulatory Visit | Attending: Family Medicine | Admitting: Family Medicine

## 2022-04-05 ENCOUNTER — Ambulatory Visit (INDEPENDENT_AMBULATORY_CARE_PROVIDER_SITE_OTHER): Payer: Medicare HMO | Admitting: Family Medicine

## 2022-04-05 VITALS — BP 142/82 | HR 92 | Temp 98.3°F | Ht 70.0 in | Wt 164.0 lb

## 2022-04-05 DIAGNOSIS — M5412 Radiculopathy, cervical region: Secondary | ICD-10-CM | POA: Diagnosis not present

## 2022-04-05 MED ORDER — PREDNISONE 20 MG PO TABS: ORAL_TABLET | ORAL | 0 refills | Status: DC

## 2022-04-05 NOTE — Progress Notes (Signed)
Subjective:    Patient ID: Todd Lawrence, male    DOB: Aug 02, 1950, 71 y.o.   MRN: [id number]  HPI  MRI 2015 of C -spine: IMPRESSION:  1. 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  2. 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  Increased multifactorial degenerative left C5 moderate to severe  foraminal stenosis.  Patient underwent cervical fusion in 2015 although I cannot find a copy of the operative report that describes which levels were operated on.  He was doing well until about 1 week ago.  1 week ago, he was cutting a tree.  The tree fell awkwardly and he had to jump out of his way quickly.  After that he started developing pain in his neck which is radiating down his right arm.  He has numbness and tingling in his thumb, and index finger on his right hand.  He is losing the strength in the fingers on his right hand.  He has a difficult time moving his index finger flexing and extending the PIP and DIP joints.  He also complains of a radiating pain going down his right arm that is intense and electrical in sensation.  All of this sounds like cervical radiculopathy.  He denies any bowel or bladder incontinence Past Medical History:  Diagnosis Date   Acute meniscal tear of left knee    DDD (degenerative disc disease), lumbar    has received ESI x 3   Diabetes mellitus without complication ([facility])    GERD (gastroesophageal reflux disease)    History of blood transfusion    History of staph infection    after thumb surgery   Hyperlipidemia    Left rotator cuff tear    Primary localized osteoarthritis of left knee    Past Surgical History:  Procedure Laterality Date   CERVICAL FUSION     COLONOSCOPY     ESOPHAGOGASTRODUODENOSCOPY (EGD) WITH PROPOFOL N/A 07/24/2021   Procedure: ESOPHAGOGASTRODUODENOSCOPY (EGD) WITH PROPOFOL;  Surgeon:  Carol Ada, MD;  [facility];  Service: Endoscopy;  Laterality: N/A;   FOOT SURGERY Right    JOINT REPLACEMENT N/A    Phreesia 12/02/2019   KNEE ARTHROSCOPY Bilateral    SHOULDER ARTHROSCOPY Left    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 Swallow whole.     atorvastatin (LIPITOR) 20 MG tablet TAKE 1 TABLET BY MOUTH EVERY DAY 90 tablet 0   CREON 36000-114000 units CPEP capsule Take 36,000 Units by mouth 3 (three) times daily with meals.     hypromellose (SYSTANE OVERNIGHT THERAPY) 0.3 % GEL ophthalmic ointment Place 1 application into both eyes at bedtime.     ketotifen (ZADITOR) 0.025 % ophthalmic solution Place 1 drop into both eyes 2 (two) times daily.     metFORMIN (GLUCOPHAGE) 1000 MG tablet TAKE 1 TABLET (1,000  MG TOTAL) BY MOUTH 2 (TWO) TIMES DAILY WITH A MEAL. 180 tablet 3   Misc Natural Products (OSTEO BI-FLEX ADV JOINT SHIELD PO) Take 1 tablet by mouth in the morning and at bedtime.     Multiple Vitamin (MULTIVITAMIN WITH MINERALS) TABS tablet Take 1 tablet by mouth daily.     omeprazole (PRILOSEC) 40 MG capsule TAKE 1 CAPSULE BY MOUTH EVERY DAY (Patient taking differently: Take 40 mg by mouth daily as needed (acid reflux).) 30 capsule 11   pioglitazone (ACTOS) 30 MG tablet Take 1 tablet (30 mg total) by mouth daily. 30 tablet 11   Propylene Glycol (SYSTANE BALANCE) 0.6 % SOLN Place 1 drop into both eyes in the morning and at bedtime.      sitaGLIPtin (JANUVIA) 100 MG tablet Take 1 tablet (100 mg total) by mouth daily. 90 tablet 3   triamcinolone (NASACORT) 55 MCG/ACT AERO nasal inhaler Place 2 sprays into the nose daily. 1 each 12   No current facility-administered medications on file prior to visit.   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  Other Topics Concern   Not on file  Social History Narrative   Not on file   Social Determinants of Health   Financial Resource Strain: [place]  (12/12/2020)   Overall Financial Resource Strain (CARDIA)    Difficulty of Paying Living Expenses: Not hard at all  Food Insecurity: No [place] (12/12/2020)   Hunger Vital Sign    Worried About Running Out of Food in the Last Year: Never true    [place] in the Last Year: Never true  Transportation Needs: No Transportation Needs (12/12/2020)   PRAPARE - Hydrologist (Medical): No    Lack of Transportation (Non-Medical): No  Physical Activity: Inactive (12/12/2020)   Exercise Vital Sign    Days of Exercise per Week: 0 days    Minutes of Exercise per Session: 0 min  Stress: No Stress Concern Present (12/12/2020)   [place]    Feeling of Stress : Not at all  Social Connections: Moderately Integrated (12/12/2020)   Social Connection and Isolation Panel [NHANES]    Frequency of Communication with Friends and Family: Three times a week    Frequency of Social  Gatherings with Friends and Family: More than three times a week    Attends Religious Services: More than 4 times per year    Active Member of [employer] or Organizations: No    Attends Archivist Meetings: Never    Marital Status: Married  Human resources officer Violence: Not At Risk (12/12/2020)   Humiliation, Afraid, Rape, and Kick questionnaire    Fear of Current or Ex-Partner: No    Emotionally Abused: No    Physically Abused: No    Sexually Abused: No   Family History  Problem Relation Age  of Onset   Heart disease Father    Asthma Sister    Arthritis Sister      Review of Systems     Objective:   Physical Exam Vitals reviewed.  Constitutional:      General: He is not in acute distress.    Appearance: Normal appearance. He is normal weight.  Cardiovascular:     Rate and Rhythm: Normal rate and regular rhythm.     Pulses: Normal pulses.     Heart sounds: Normal heart sounds. No murmur heard. Pulmonary:     Effort: Pulmonary effort is normal. No respiratory distress.     Breath sounds: Normal breath sounds. No wheezing, rhonchi or rales.  Musculoskeletal:     Cervical back: Crepitus present. Pain with movement present. Decreased range of motion.  Neurological:     General: No focal deficit present.     Mental Status: He is alert and oriented to person, place, and time.     Cranial Nerves: No cranial nerve deficit.     Sensory: Sensory deficit present.     Motor: Weakness present.     Gait: Gait normal.           Assessment & Plan:  Cervical radiculopathy - Plan: DG Cervical Spine Complete Patient is dealing with cervical radiculopathy.  I recommended trying a prednisone taper pack and obtaining an x-ray of the cervical spine to evaluate further.  If symptoms persist or worsen he will need an MRI to evaluate to determine what is causing the nerve impingement.  Hopefully we can treat this conservatively with prednisone

## 2022-04-10 ENCOUNTER — Other Ambulatory Visit: Payer: Self-pay | Admitting: Family Medicine

## 2022-05-03 ENCOUNTER — Other Ambulatory Visit: Payer: Self-pay | Admitting: Family Medicine

## 2022-05-03 DIAGNOSIS — R200 Anesthesia of skin: Secondary | ICD-10-CM

## 2022-05-03 DIAGNOSIS — M4802 Spinal stenosis, cervical region: Secondary | ICD-10-CM

## 2022-05-03 DIAGNOSIS — R29898 Other symptoms and signs involving the musculoskeletal system: Secondary | ICD-10-CM

## 2022-05-03 DIAGNOSIS — M5030 Other cervical disc degeneration, unspecified cervical region: Secondary | ICD-10-CM

## 2022-05-06 ENCOUNTER — Other Ambulatory Visit: Payer: Self-pay | Admitting: Family Medicine

## 2022-05-06 DIAGNOSIS — R200 Anesthesia of skin: Secondary | ICD-10-CM

## 2022-05-06 DIAGNOSIS — M5030 Other cervical disc degeneration, unspecified cervical region: Secondary | ICD-10-CM

## 2022-05-06 DIAGNOSIS — R29898 Other symptoms and signs involving the musculoskeletal system: Secondary | ICD-10-CM

## 2022-05-06 DIAGNOSIS — M4802 Spinal stenosis, cervical region: Secondary | ICD-10-CM

## 2022-05-13 DIAGNOSIS — M5412 Radiculopathy, cervical region: Secondary | ICD-10-CM | POA: Diagnosis not present

## 2022-05-14 ENCOUNTER — Other Ambulatory Visit (HOSPITAL_COMMUNITY): Payer: Self-pay | Admitting: Neurosurgery

## 2022-05-14 DIAGNOSIS — M5412 Radiculopathy, cervical region: Secondary | ICD-10-CM

## 2022-05-15 ENCOUNTER — Ambulatory Visit (HOSPITAL_COMMUNITY)
Admission: RE | Admit: 2022-05-15 | Discharge: 2022-05-15 | Disposition: A | Discharge status: Home / Self Care | Payer: Medicare HMO | Source: Ambulatory Visit | Attending: Neurosurgery | Admitting: Neurosurgery

## 2022-05-15 DIAGNOSIS — M50121 Cervical disc disorder at C4-C5 level with radiculopathy: Secondary | ICD-10-CM | POA: Diagnosis not present

## 2022-05-15 DIAGNOSIS — M5412 Radiculopathy, cervical region: Secondary | ICD-10-CM | POA: Diagnosis not present

## 2022-05-15 DIAGNOSIS — G9589 Other specified diseases of spinal cord: Secondary | ICD-10-CM | POA: Diagnosis not present

## 2022-05-15 DIAGNOSIS — M50122 Cervical disc disorder at C5-C6 level with radiculopathy: Secondary | ICD-10-CM | POA: Diagnosis not present

## 2022-05-15 DIAGNOSIS — M4802 Spinal stenosis, cervical region: Secondary | ICD-10-CM | POA: Diagnosis not present

## 2022-05-19 DIAGNOSIS — M5412 Radiculopathy, cervical region: Secondary | ICD-10-CM | POA: Diagnosis not present

## 2022-06-09 ENCOUNTER — Other Ambulatory Visit: Payer: Self-pay | Admitting: Family Medicine

## 2022-06-09 NOTE — Telephone Encounter (Signed)
Requested Prescriptions  Pending Prescriptions Disp Refills   atorvastatin (LIPITOR) 20 MG tablet [Pharmacy Med Name: ATORVASTATIN 20 MG TABLET] 90 tablet 1    Sig: TAKE 1 TABLET BY MOUTH EVERY DAY     Cardiovascular:  Antilipid - Statins Failed - 06/09/2022  2:45 AM      Failed - Lipid Panel in normal range within the last 12 months    Cholesterol  Date Value Ref Range Status  01/02/2021 188 <200 mg/dL Final   LDL Cholesterol (Calc)  Date Value Ref Range Status  01/02/2021 97 mg/dL (calc) Final    Comment:    Reference range: <100 . Desirable range <100 mg/dL for primary prevention;   <70 mg/dL for patients with CHD or diabetic patients  with > or = 2 CHD risk factors. Marland Kitchen LDL-C is now calculated using the Martin-Hopkins  calculation, which is a validated novel method providing  better accuracy than the Friedewald equation in the  estimation of LDL-C.  Cresenciano Genre et al. Annamaria Helling. 9563;875(64): 2061-2068  (http://education.[facility].com/faq/FAQ164)    HDL  Date Value Ref Range Status  01/02/2021 78 > OR = 40 mg/dL Final   Triglycerides  Date Value Ref Range Status  01/02/2021 43 <150 mg/dL Final         Passed - Patient is not pregnant      Passed - Valid encounter within last 12 months    Recent Outpatient Visits           6 months ago DDD (degenerative disc disease), lumbar   [place] [name], MD   9 months ago DDD (degenerative disc disease), lumbar   [place] [name], MD   9 months ago DDD (degenerative disc disease), lumbar   [place] Pickard, Cammie Mcgee, MD   1 year ago Weight loss   [place] [name], MD   1 year ago Controlled type 2 diabetes mellitus without complication, without long-term current use of insulin ([place])   [facility] Pickard, Cammie Mcgee, MD

## 2022-06-10 DIAGNOSIS — M48061 Spinal stenosis, lumbar region without neurogenic claudication: Secondary | ICD-10-CM | POA: Diagnosis not present

## 2022-06-10 DIAGNOSIS — M4322 Fusion of spine, cervical region: Secondary | ICD-10-CM | POA: Diagnosis not present

## 2022-06-10 DIAGNOSIS — M4803 Spinal stenosis, cervicothoracic region: Secondary | ICD-10-CM | POA: Diagnosis not present

## 2022-06-10 DIAGNOSIS — M5412 Radiculopathy, cervical region: Secondary | ICD-10-CM | POA: Diagnosis not present

## 2022-06-10 DIAGNOSIS — Z981 Arthrodesis status: Secondary | ICD-10-CM | POA: Diagnosis not present

## 2022-06-10 DIAGNOSIS — M5413 Radiculopathy, cervicothoracic region: Secondary | ICD-10-CM | POA: Diagnosis not present

## 2022-06-10 DIAGNOSIS — M4313 Spondylolisthesis, cervicothoracic region: Secondary | ICD-10-CM | POA: Diagnosis not present

## 2022-06-30 ENCOUNTER — Telehealth: Payer: Self-pay | Admitting: Pharmacist

## 2022-06-30 NOTE — Progress Notes (Unsigned)
Chronic Care Management Pharmacy Assistant   Name: Todd Lawrence  MRN: [id number] DOB: 05-Dec-1950   Reason for Encounter: General Adherence Call    Recent office visits:  11/16/2021 OV (PCP) [name], MD; no medication changes indicated.  Recent consult visits:  None  Hospital visits:  None in previous 6 months  Medications: Outpatient Encounter Medications as of 06/30/2022  Medication Sig Note   aspirin EC 81 MG tablet Take 81 mg by mouth daily. Swallow whole.    atorvastatin (LIPITOR) 20 MG tablet TAKE 1 TABLET BY MOUTH EVERY DAY    CREON 36000-114000 units CPEP capsule Take 36,000 Units by mouth 3 (three) times daily with meals. 07/14/2021: SAMPLE MED    hypromellose (SYSTANE OVERNIGHT THERAPY) 0.3 % GEL ophthalmic ointment Place 1 application into both eyes at bedtime.    ketotifen (ZADITOR) 0.025 % ophthalmic solution Place 1 drop into both eyes 2 (two) times daily.    metFORMIN (GLUCOPHAGE) 1000 MG tablet TAKE 1 TABLET (1,000 MG TOTAL) BY MOUTH 2 (TWO) TIMES DAILY WITH A MEAL.    Misc Natural Products (OSTEO BI-FLEX ADV JOINT SHIELD PO) Take 1 tablet by mouth in the morning and at bedtime.    Multiple Vitamin (MULTIVITAMIN WITH MINERALS) TABS tablet Take 1 tablet by mouth daily.    omeprazole (PRILOSEC) 40 MG capsule TAKE 1 CAPSULE BY MOUTH EVERY DAY (Patient taking differently: Take 40 mg by mouth daily as needed (acid reflux).)    pioglitazone (ACTOS) 30 MG tablet TAKE 1 TABLET BY MOUTH EVERY DAY    predniSONE (DELTASONE) 20 MG tablet 3 tabs poqday 1-2, 2 tabs poqday 3-4, 1 tab poqday 5-6    Propylene Glycol (SYSTANE BALANCE) 0.6 % SOLN Place 1 drop into both eyes in the morning and at bedtime.    sitaGLIPtin (JANUVIA) 100 MG tablet Take 1 tablet (100 mg total) by mouth daily.    triamcinolone (NASACORT) 55 MCG/ACT AERO nasal inhaler Place 2 sprays into the nose daily.    No facility-administered encounter medications on file as of 06/30/2022.   [place] for General Review Call   Chart Review:  Have there been any documented new, changed, or discontinued medications since last visit? {yes/no:20286} (If yes, include name, dose, frequency, date) Has there been any documented recent hospitalizations or ED visits since last visit with Clinical Pharmacist? {yes/no:20286} Brief Summary (including medication and/or Diagnosis changes):   Adherence Review:  Does the Clinical Pharmacist Assistant have access to adherence rates? {yes/no:20286} Adherence rates for STAR metric medications (List medication(s)/day supply/ last 2 fill dates). Adherence rates for medications indicated for disease state being reviewed (List medication(s)/day supply/ last 2 fill dates). Does the patient have >5 day gap between last estimated fill dates for any of the above medications or other medication gaps? {yes/no:20286} Reason for medication gaps. Do you receive your medications through PAP? {yes/no:20286}  If Yes, what is the status? Last received?   Disease State Questions:  Able to connect with Patient? {yes/no:20286} Did patient have any problems with their health recently? {yes/no:20286} Note problems and Concerns: Have you had any admissions or emergency room visits or worsening of your condition(s) since last visit? {yes/no:20286} Details of ED visit, hospital visit and/or worsening condition(s): Have you had any visits with new specialists or providers since your last visit? {yes/no:20286} Explain: Have you had any new health care problem(s) since your last visit? {yes/no:20286} New problem(s) reported: Have you run out of any of your medications  since you last spoke with clinical pharmacist? {yes/no:20286} What caused you to run out of your medications? Are there any medications you are not taking as prescribed? {yes/no:20286} What kept you from taking your medications as prescribed? Are you having any issues or side effects with your  medications? {yes/no:20286} Note of issues or side effects: Do you have any other health concerns or questions you want to discuss with your Clinical Pharmacist before your next visit? {yes/no:20286} Note additional concerns and questions from Patient. Are there any health concerns that you feel we can do a better job addressing? {yes/no:20286} Note Patient's response. Are you having any problems with any of the following since the last visit: (select all that apply)  {General Call:27390}  Details: 12. Any falls since last visit? {yes/no:20286}  Details: 13. Any increased or uncontrolled pain since last visit? {yes/no:20286}  Details: 14. Next visit Type: {Telephone/Office:25179}       Visit with:        Date:        Time:  38. Additional Details? {yes/no:20286}    Care Gaps: Medicare Annual Wellness: Overdue since 12/12/2021 Ophthalmology Exam: Overdue since 03/31/2022 Foot Exam: Overdue since 01/16/2022 Hemoglobin A1C: 6.8% on 03/26/2022 Colonoscopy: Completed 03/28/2019  No future appointments.  Star Rating Drugs: Atorvastatin 20 mg last filled 06/09/2022 90 DS Metformin 1000 mg last filled 05/13/2022 90 DS Pioglitazone 30 mg last filled 06/14/2022 30 DS  April D Calhoun, [place] Pharmacist Assistant [phone]

## 2022-07-19 ENCOUNTER — Encounter: Payer: Self-pay | Admitting: Family Medicine

## 2022-08-12 ENCOUNTER — Encounter: Payer: Medicare HMO | Admitting: Pharmacist

## 2022-08-12 NOTE — Progress Notes (Unsigned)
Care Management & Coordination Services Pharmacy Note  08/12/2022 Name:  Todd Lawrence MRN:  [id number] DOB:  May 09, 1951  Summary: ***  Recommendations/Changes made from today's visit: ***  Follow up plan: ***   Subjective: Todd Lawrence is an 72 y.o. year old male who is a primary patient of Pickard, Cammie Mcgee, MD.  The care coordination team was consulted for assistance with disease management and care coordination needs.    Engaged with patient by telephone for follow up visit.  Recent office visits:  11/16/2021 OV (PCP) Todd Frizzle, MD; no medication changes indicated.   Recent consult visits:  None   Hospital visits:  None in previous 6 months   Objective:  Lab Results  Component Value Date   CREATININE 0.82 11/16/2021   BUN 16 11/16/2021   EGFR 93 11/16/2021   GFRNONAA 87 01/02/2021   GFRAA 101 01/02/2021   NA 135 11/16/2021   K 4.6 11/16/2021   CALCIUM 9.6 11/16/2021   CO2 26 11/16/2021   GLUCOSE 156 (H) 11/16/2021    Lab Results  Component Value Date/Time   HGBA1C 6.8 (H) 03/26/2022 10:20 AM   HGBA1C 7.3 (H) 11/16/2021 01:02 PM   MICROALBUR <0.2 01/02/2021 09:34 AM   MICROALBUR 0.3 12/04/2019 09:04 AM    Last diabetic Eye exam:  Lab Results  Component Value Date/Time   HMDIABEYEEXA No Retinopathy 03/31/2021 01:10 PM    Last diabetic Foot exam: No results found for: "HMDIABFOOTEX"   Lab Results  Component Value Date   CHOL 188 01/02/2021   HDL 78 01/02/2021   LDLCALC 97 01/02/2021   TRIG 43 01/02/2021   CHOLHDL 2.4 01/02/2021       Latest Ref Rng & Units 11/16/2021    1:02 PM 04/20/2021   12:57 PM 03/17/2021    9:22 AM  Hepatic Function  Total Protein 6.1 - 8.1 g/dL 6.9  6.8  6.8   AST 10 - 35 U/L '20  20  18   '$ ALT 9 - 46 U/L '13  13  14   '$ Total Bilirubin 0.2 - 1.2 mg/dL 0.4  0.7  0.4     Lab Results  Component Value Date/Time   TSH 1.32 01/02/2021 09:28 AM   TSH 1.47 11/02/2016 08:50 AM       Latest Ref Rng & Units 11/16/2021     1:02 PM 04/20/2021   12:57 PM 03/17/2021    9:22 AM  CBC  WBC 3.8 - 10.8 Thousand/uL 5.2  10.2  5.9   Hemoglobin 13.2 - 17.1 g/dL 13.0  12.4  14.0   Hematocrit 38.5 - 50.0 % 39.3  37.6  42.2   Platelets 140 - 400 Thousand/uL 220  497  253     Lab Results  Component Value Date/Time   VD25OH 42 09/21/2013 10:52 AM    Clinical ASCVD: {YES/NO:21197} The 10-year ASCVD risk score (Arnett DK, et al., 2019) is: 40.6%   Values used to calculate the score:     Age: 53 years     Sex: Male     Is Non-Hispanic African American: No     Diabetic: Yes     Tobacco smoker: No     Systolic Blood Pressure: 379 mmHg     Is BP treated: No     HDL Cholesterol: 78 mg/dL     Total Cholesterol: 188 mg/dL    ***Other: (CHADS2VASc if Afib, MMRC or CAT for COPD, ACT, DEXA)     12/12/2020  8:30 AM 12/04/2019    8:35 AM 04/04/2018   11:28 AM  Depression screen PHQ 2/9  Decreased Interest 0 0 0  Down, Depressed, Hopeless 0 0 0  PHQ - 2 Score 0 0 0     Social History   Tobacco Use  Smoking Status Never  Smokeless Tobacco Never   BP Readings from Last 3 Encounters:  04/05/22 (!) 142/82  03/05/22 122/76  12/09/21 (!) 148/92   Pulse Readings from Last 3 Encounters:  04/05/22 92  03/05/22 82  12/09/21 73   Wt Readings from Last 3 Encounters:  04/05/22 164 lb (74.4 kg)  03/05/22 162 lb (73.5 kg)  11/16/21 167 lb 12.8 oz (76.1 kg)   BMI Readings from Last 3 Encounters:  04/05/22 23.53 kg/m  03/05/22 23.24 kg/m  11/16/21 24.08 kg/m    No Known Allergies  Medications Reviewed Today     Reviewed by Todd Driver, LPN (Licensed Practical Nurse) on 04/05/22 at 25  Med List Status: <None>   Medication Order Taking? Sig Documenting Provider Last Dose Status Informant  aspirin EC 81 MG tablet [id number] No Take 81 mg by mouth daily. Swallow whole. [provider] Taking Active Spouse/Significant Other  atorvastatin (LIPITOR) 20 MG tablet [id number]  TAKE 1 TABLET BY  MOUTH EVERY DAY Todd Frizzle, MD  Active   CREON 3466221726 units CPEP capsule [id number] No Take 36,000 Units by mouth 3 (three) times daily with meals. [provider] Taking Active Spouse/Significant Other           Med Note Todd Lawrence, Todd Lawrence   Tue Jul 14, 2021  1:37 PM) SAMPLE MED   hypromellose (SYSTANE OVERNIGHT THERAPY) 0.3 % GEL ophthalmic ointment [id number] No Place 1 application into both eyes at bedtime. [provider] Taking Active Spouse/Significant Other  ketotifen (ZADITOR) 0.025 % ophthalmic solution [id number] No Place 1 drop into both eyes 2 (two) times daily. [provider] Taking Active Spouse/Significant Other  metFORMIN (GLUCOPHAGE) 1000 MG tablet [id number] No TAKE 1 TABLET (1,000 MG TOTAL) BY MOUTH 2 (TWO) TIMES DAILY WITH A MEAL. Todd Frizzle, MD Taking Active   Misc Natural Products (OSTEO BI-FLEX ADV JOINT SHIELD PO) [id number] No Take 1 tablet by mouth in the morning and at bedtime. [provider] Taking Active Spouse/Significant Other  Multiple Vitamin (MULTIVITAMIN WITH MINERALS) TABS tablet [id number] No Take 1 tablet by mouth daily. [provider] Taking Active Spouse/Significant Other  omeprazole (PRILOSEC) 40 MG capsule [id number] No TAKE 1 CAPSULE BY MOUTH EVERY DAY  Patient taking differently: Take 40 mg by mouth daily as needed (acid reflux).   Todd Frizzle, MD Taking Active Spouse/Significant Other  pioglitazone (ACTOS) 30 MG tablet [id number] No Take 1 tablet (30 mg total) by mouth daily. Todd Frizzle, MD Taking Active Spouse/Significant Other  Propylene Glycol (SYSTANE BALANCE) 0.6 % SOLN [id number] No Place 1 drop into both eyes in the morning and at bedtime. [provider] Taking Active Spouse/Significant Other  sitaGLIPtin (JANUVIA) 100 MG tablet [id number] No Take 1 tablet (100 mg total) by mouth daily. Todd Frizzle, MD Taking Active   triamcinolone (NASACORT) 55 MCG/ACT AERO nasal  inhaler [id number]  Place 2 sprays into the nose daily. Todd Frizzle, MD  Active             SDOH:  (Social Determinants of Health) assessments and interventions performed: {yes/no:20286} SDOH Interventions    Flowsheet Row Clinical Support from 12/12/2020 in [place]  [facility]  SDOH Interventions   Food Insecurity Interventions Intervention Not Indicated  Housing Interventions Intervention Not Indicated  Transportation Interventions Intervention Not Indicated  Financial Strain Interventions Intervention Not Indicated  Physical Activity Interventions Other (Comments)  [patient states he is physically active at work. No routine exercise program]  Stress Interventions Intervention Not Indicated  Social Connections Interventions Intervention Not Indicated       Medication Assistance: {MEDASSISTANCEINFO:25044}  Medication Access: Within the past 30 days, how often has patient missed a dose of medication? *** Is a pillbox or other method used to improve adherence? {YES/NO:21197} Factors that may affect medication adherence? {CHL DESC; BARRIERS:21522} Are meds synced by current pharmacy? {YES/NO:21197} Are meds delivered by current pharmacy? {YES/NO:21197} Does patient experience delays in picking up medications due to transportation concerns? {YES/NO:21197}  [facility] Reviewed: Is patient disadvantaged to use [facility]?: {YES/NO:21197} Current Rx insurance plan: *** Name and location of Current pharmacy:  [id number] [place], [place] [id number]: [phone]: [phone] [facility] services reviewed with patient today?: {YES/NO:21197} Patient requests to transfer care to [facility]?: {YES/NO:21197} Reason patient declined to change pharmacies: {US patient preference:27474}  Compliance/Adherence/Medication fill history: Care  Gaps: ***  Star-Rating Drugs: ***   Assessment/Plan     Hyperlipidemia: (LDL goal < 70) -Not ideally controlled -Current treatment: Atorvastatin '20mg'$  daily -Medications previously tried: none noted  -Current dietary patterns: working on cutting back on sugars, denies any regular sodas, does admit to occasional ice cream or milkshake -Current exercise habits: very active mowing yards etc. -Educated on Cholesterol goals;  Benefits of statin for ASCVD risk reduction; Importance of limiting foods high in cholesterol; -Recommended to continue current medication  Update 04/30/21 ASCVD Risk 30.8% has patient in high risk for CV event.  Currently on moderate intensity statin.  Would recommend to repeat lipid panel.  If LDL is still on the high side would recommend increase to high intensity statin if patient is open to the idea and tolerating current dose well.  Continue meds for now.  Diabetes (A1c goal <7%) -Not ideally controlled -Current medications: Metormin 1,'000mg'$  BID with a meal Pioglitazone '30mg'$  daily -Medications previously tried: Metformin (d/c)  -Current home glucose readings fasting glucose: not checking post prandial glucose: not checking -Denies hypoglycemic/hyperglycemic symptoms -Current meal patterns:  breakfast: cheerios  lunch: sandwich  dinner: home cooked meal snacks: almonds, cheez-itz drinks: diet cheerwine, propel packets, water -Current exercise: see above -Educated on A1c and blood sugar goals; Prevention and management of hypoglycemic episodes; Carbohydrate counting and/or plate method -Counseled to check feet daily and get yearly eye exams -Recommended to continue current medication Recommended continuing to cut back on carbohydrates and limit to one source per meal.  Recommend repeat lipid panel. If A1c still elevated at that time would recommend adding back Metformin '500mg'$  daily to [facility].  Assessed patient finances, I believe he would  qualify for PAP with [facility].  Application provided, patient will fill out their part and return for completion.  Update 04/30/21 He was denied by the [facility] PAP program due to income.  However, since he has been taken off this medication to see if it resolves his rapid weight loss.  Was started in [place] to replace.  Has not been checking sugars at home.  No changes to diet.  Plans to have A1c rechecked in January 2023.  Encouraged him to continue to work on diet/exercise. No  issues with cost of medications currently.  Osteoarthritis/DDD (Goal: Minimize symptoms) -Controlled -Current treatment  IBU prn - not taking very often -Medications previously tried: none noted  -Recommended to continue current medication   Patient Goals/Self-Care Activities Patient will:  - take medications as prescribed focus on medication adherence by using pill box collaborate with provider on medication access solutions  Follow Up Plan: The care management team will reach out to the patient again over the next 120 days.            Beverly Milch, PharmD, CPP Clinical Pharmacist Practitioner [place] [phone]

## 2022-11-02 IMAGING — CT CT SHOULDER*L* W/O CM
1 series · 12 of 14 positions shown, 15 images · non-contrast
Comparison: 01/26/2021

CLINICAL DATA: Chronic left shoulder pain. Preoperative exam for
shoulder surgery

EXAM:
CT OF THE UPPER LEFT EXTREMITY WITHOUT CONTRAST
TECHNIQUE: Multidetector CT imaging of the upper left extremity was performed
according to the standard protocol.

[Series 3: soft tissue · axial · 0.47mm/px · z∈[-229,-55]mm · 12 of 103 slices shown, 15 images]
[im 8/103  soft-tissue]
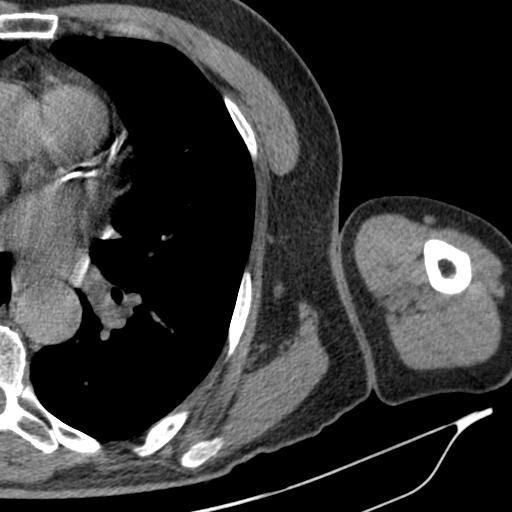
[im 8/103  bone]
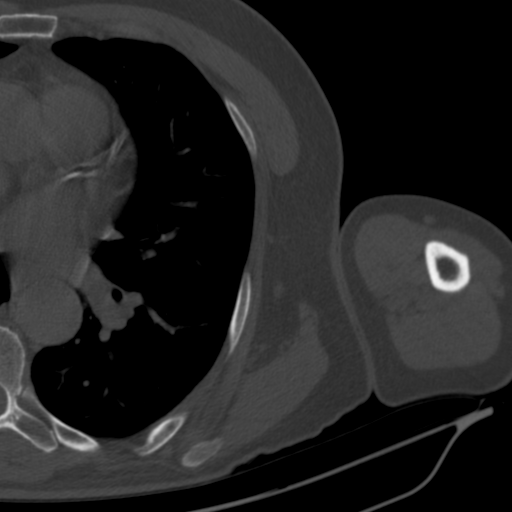
[im 16/103  bone]
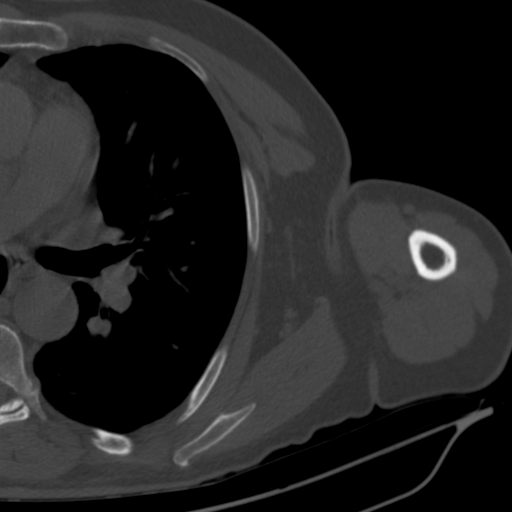
[im 24/103  bone]
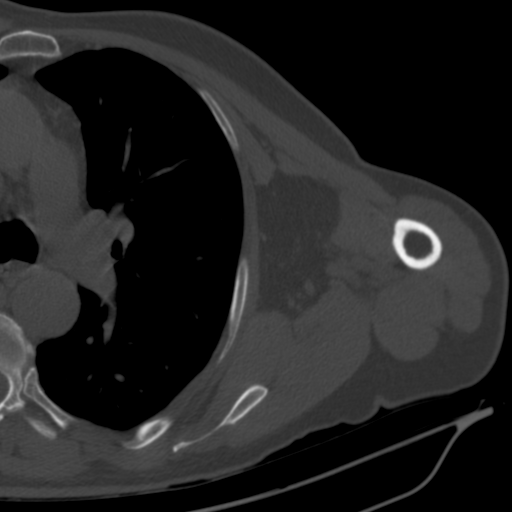
[im 32/103  bone]
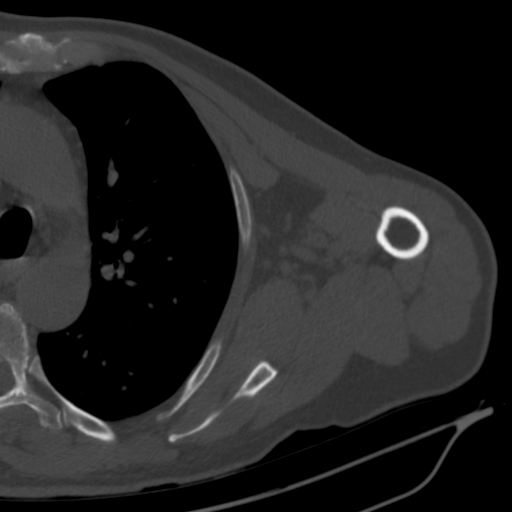
[im 40/103  soft-tissue]
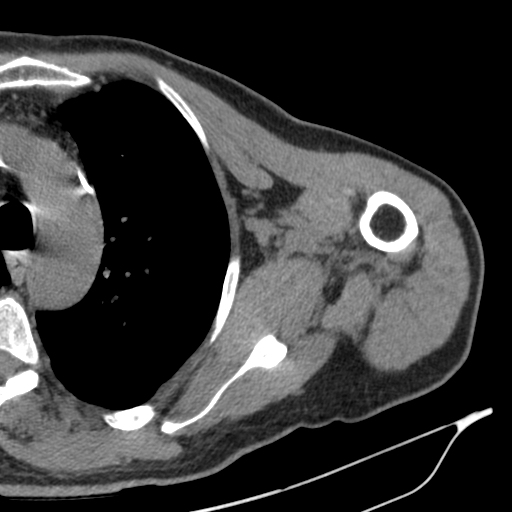
[im 40/103  bone]
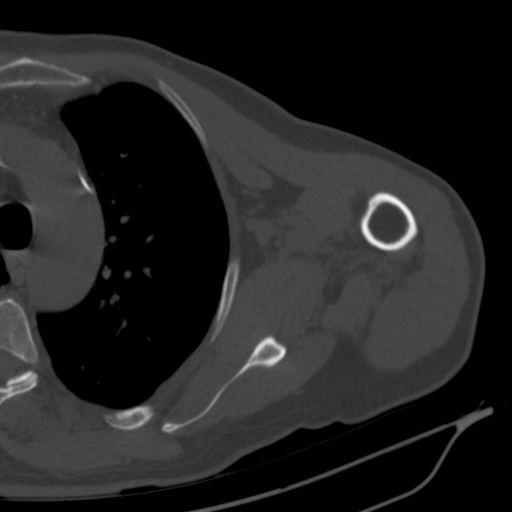
[im 48/103  bone]
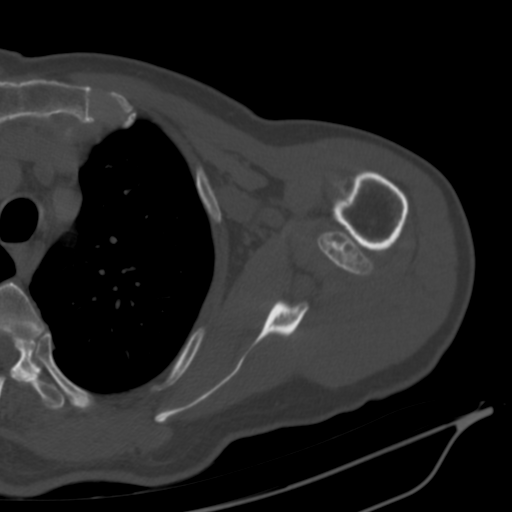
[im 55/103  bone]
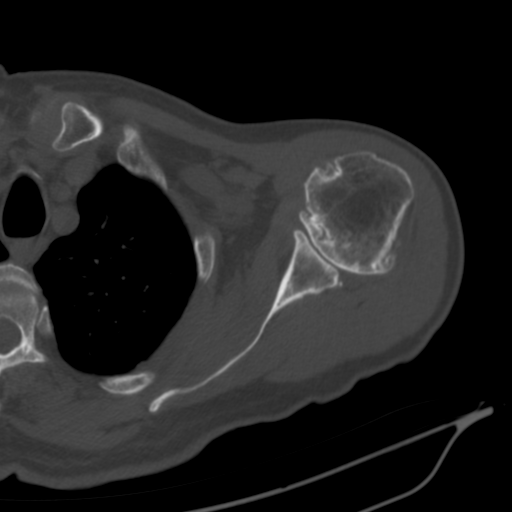
[im 63/103  bone]
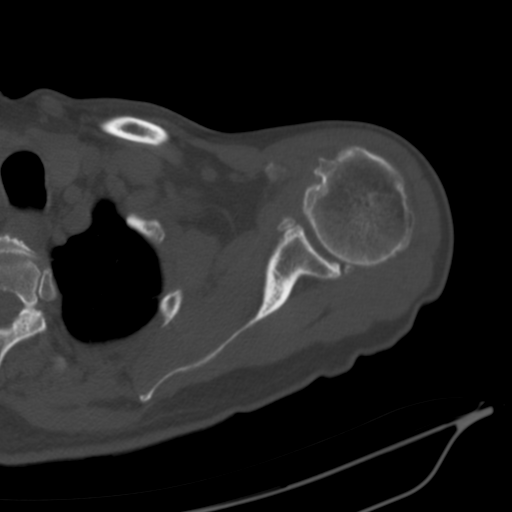
[im 71/103  soft-tissue]
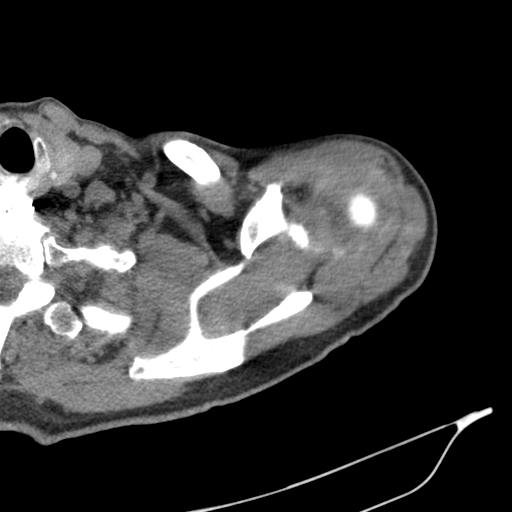
[im 71/103  bone]
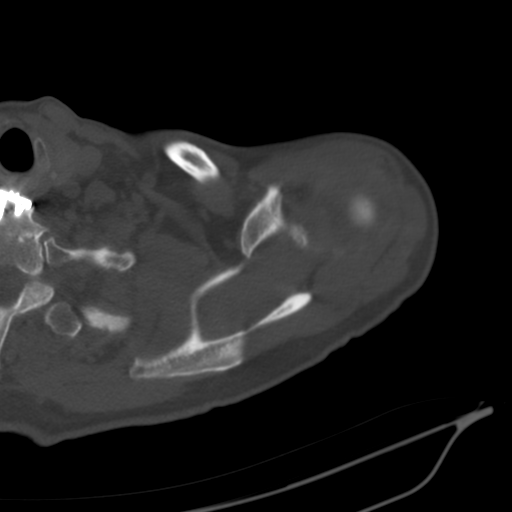
[im 79/103  bone]
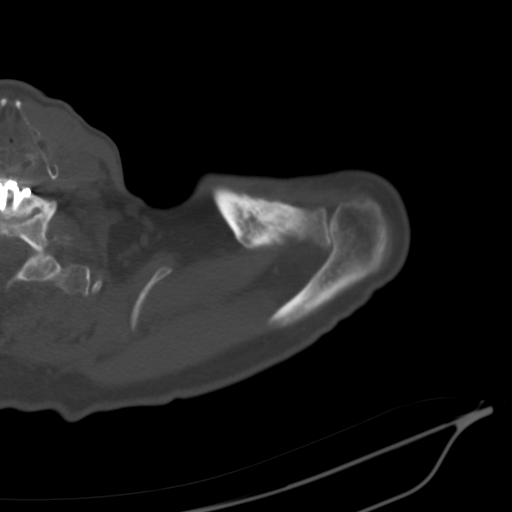
[im 87/103  bone]
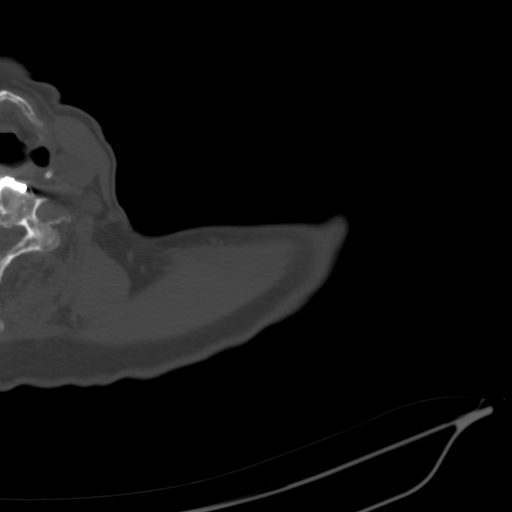
[im 95/103  bone]
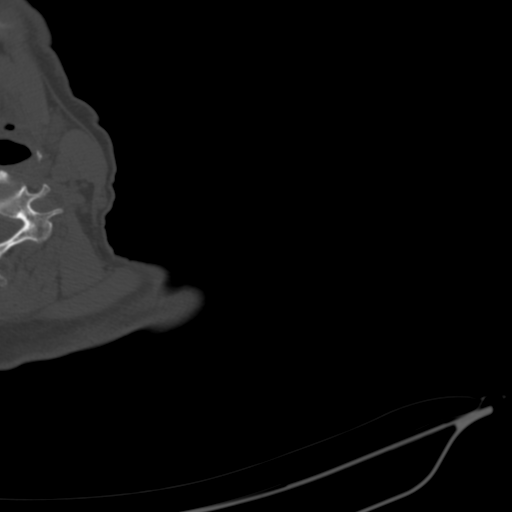

[12 of 14 positions shown; findings below may reference images not displayed]

FINDINGS: Bones/Joint/Cartilage

No acute fracture. No dislocation. Severe glenohumeral joint
osteoarthritis manifested by complete joint space loss, subchondral
sclerosis/cystic change, and bulky marginal osteophyte formation.
There are mild subchondral cystic changes within the inferior
glenoid. Large ossified loose body along the posterosuperior joint
line measuring up to 1.9 cm. Additional ossified body adjacent to
the anterosuperior glenoid rim measuring up to 0.9 cm. A 1.3 cm
loose body is present within the biceps tendon sheath. No large
glenohumeral joint effusion is evident.

Mild degenerative changes of the acromioclavicular joint. No large
subacromial-subdeltoid bursal fluid collection. Remaining visualized
osseous structures appear grossly intact. Partially visualized
cervical ACDF hardware.

Ligaments

Suboptimally assessed by CT.

Muscles and Tendons

Preserved muscle bulk of the rotator cuff musculature without
significant atrophy or fatty infiltration. Rotator cuff tendons
better evaluated on recent MRI.

Soft tissues

No soft tissue fluid collection or hematoma. No axillary
lymphadenopathy. Visualized lung field is clear. Aortic and coronary
artery calcifications are visualized.
IMPRESSION: Severe glenohumeral joint osteoarthritis with multiple large
ossified loose bodies.

Aortic Atherosclerosis ([id number].I).

## 2022-11-03 ENCOUNTER — Other Ambulatory Visit: Payer: Medicare Other

## 2022-11-03 DIAGNOSIS — R634 Abnormal weight loss: Secondary | ICD-10-CM

## 2022-11-03 DIAGNOSIS — E7800 Pure hypercholesterolemia, unspecified: Secondary | ICD-10-CM

## 2022-11-03 DIAGNOSIS — K8681 Exocrine pancreatic insufficiency: Secondary | ICD-10-CM

## 2022-11-03 DIAGNOSIS — E119 Type 2 diabetes mellitus without complications: Secondary | ICD-10-CM

## 2022-11-04 LAB — CBC WITH DIFFERENTIAL/PLATELET
Absolute Monocytes: 632 cells/uL (ref 200–950)
Basophils Absolute: 38 cells/uL (ref 0–200)
Basophils Relative: 0.7 %
Eosinophils Absolute: 243 cells/uL (ref 15–500)
Eosinophils Relative: 4.5 %
HCT: 38.4 % — ABNORMAL LOW (ref 38.5–50.0)
Hemoglobin: 12.8 g/dL — ABNORMAL LOW (ref 13.2–17.1)
Lymphs Abs: 1226 cells/uL (ref 850–3900)
MCH: 30.8 pg (ref 27.0–33.0)
MCHC: 33.3 g/dL (ref 32.0–36.0)
MCV: 92.3 fL (ref 80.0–100.0)
MPV: 10.7 fL (ref 7.5–12.5)
Monocytes Relative: 11.7 %
Neutro Abs: 3262 cells/uL (ref 1500–7800)
Neutrophils Relative %: 60.4 %
Platelets: 275 10*3/uL (ref 140–400)
RBC: 4.16 10*6/uL — ABNORMAL LOW (ref 4.20–5.80)
RDW: 12.7 % (ref 11.0–15.0)
Total Lymphocyte: 22.7 %
WBC: 5.4 10*3/uL (ref 3.8–10.8)

## 2022-11-04 LAB — COMPLETE METABOLIC PANEL WITH GFR
AG Ratio: 1.8 (calc) (ref 1.0–2.5)
ALT: 15 U/L (ref 9–46)
AST: 22 U/L (ref 10–35)
Albumin: 4.4 g/dL (ref 3.6–5.1)
Alkaline phosphatase (APISO): 56 U/L (ref 35–144)
BUN: 19 mg/dL (ref 7–25)
CO2: 28 mmol/L (ref 20–32)
Calcium: 9.9 mg/dL (ref 8.6–10.3)
Chloride: 96 mmol/L — ABNORMAL LOW (ref 98–110)
Creat: 0.87 mg/dL (ref 0.70–1.28)
Globulin: 2.4 g/dL (calc) (ref 1.9–3.7)
Glucose, Bld: 107 mg/dL — ABNORMAL HIGH (ref 65–99)
Potassium: 4.7 mmol/L (ref 3.5–5.3)
Sodium: 135 mmol/L (ref 135–146)
Total Bilirubin: 0.3 mg/dL (ref 0.2–1.2)
Total Protein: 6.8 g/dL (ref 6.1–8.1)
eGFR: 91 mL/min/{1.73_m2} (ref >=60)

## 2022-11-04 LAB — LIPID PANEL
Cholesterol: 172 mg/dL (ref <200)
HDL: 69 mg/dL (ref >=40)
LDL Cholesterol (Calc): 89 mg/dL (calc)
Non-HDL Cholesterol (Calc): 103 mg/dL (calc) (ref <130)
Total CHOL/HDL Ratio: 2.5 (calc) (ref <5.0)
Triglycerides: 59 mg/dL (ref <150)

## 2022-11-04 LAB — HEMOGLOBIN A1C
Hgb A1c MFr Bld: 7.3 % of total Hgb — ABNORMAL HIGH (ref <5.7)
Mean Plasma Glucose: 163 mg/dL
eAG (mmol/L): 9 mmol/L

## 2022-11-04 LAB — MICROALBUMIN / CREATININE URINE RATIO
Creatinine, Urine: 97 mg/dL (ref 20–320)
Microalb Creat Ratio: 3 mg/g creat (ref <30)
Microalb, Ur: 0.3 mg/dL

## 2022-11-04 LAB — VITAMIN B12: Vitamin B-12: 342 pg/mL (ref 200–1100)

## 2022-11-08 ENCOUNTER — Ambulatory Visit (INDEPENDENT_AMBULATORY_CARE_PROVIDER_SITE_OTHER): Payer: Medicare Other | Admitting: Family Medicine

## 2022-11-08 VITALS — BP 122/76 | HR 88 | Temp 98.6°F | Ht 70.0 in | Wt 168.8 lb

## 2022-11-08 DIAGNOSIS — Z0001 Encounter for general adult medical examination with abnormal findings: Secondary | ICD-10-CM | POA: Diagnosis not present

## 2022-11-08 DIAGNOSIS — Z23 Encounter for immunization: Secondary | ICD-10-CM | POA: Diagnosis not present

## 2022-11-08 DIAGNOSIS — R5383 Other fatigue: Secondary | ICD-10-CM

## 2022-11-08 DIAGNOSIS — E119 Type 2 diabetes mellitus without complications: Secondary | ICD-10-CM

## 2022-11-08 DIAGNOSIS — Z125 Encounter for screening for malignant neoplasm of prostate: Secondary | ICD-10-CM

## 2022-11-08 DIAGNOSIS — Z0000 Encounter for general adult medical examination without abnormal findings: Secondary | ICD-10-CM

## 2022-11-08 NOTE — Progress Notes (Signed)
Subjective:    Patient ID: Todd Lawrence, male    DOB: 1951-06-21, 72 y.o.   MRN: [id number]  HPI Patient is a 72 year old Caucasian gentleman here today for annual wellness exam.  He is due for the second dose of shingles vaccine.  Has had Prevnar 13 was had Pneumovax 23.  His last colonoscopy was in 2020.  Due to his age he does not require medical.  He is due for prostate cancer screening.  His most recent lab work is listed below and is significant only for an A1c of 7.3.  He would like to try to work on his diet rather than add additional medication.  He never started the.  However he also reports profound fatigue.  He states that he just does not have the energy that he would expect.  He denies any fevers or chills or weight loss.  He denies any shortness of breath.  He denies any chest pain or dyspnea on exertion. Lab on 11/03/2022  Component Date Value Ref Range Status   Vitamin B-12 11/03/2022 342  200 - 1,100 pg/mL Final   Comment: . Please Note: Although the reference range for vitamin B12 is [phone] pg/mL, it has been reported that between 5 and 10% of patients with values between 200 and 400 pg/mL may experience neuropsychiatric and hematologic abnormalities due to occult B12 deficiency; less than 1% of patients with values above 400 pg/mL will have symptoms. .    Creatinine, Urine 11/03/2022 97  20 - 320 mg/dL Final   Microalb, Ur 40/98/1191 0.3  mg/dL Final   Comment: Reference Range Not established    Microalb Creat Ratio 11/03/2022 3  <30 mg/g creat Final   Comment: . The ADA defines abnormalities in albumin excretion as follows: Marland Kitchen Albuminuria Category        Result (mg/g creatinine) . Normal to Mildly increased   <30 Moderately increased         30-299  Severely increased           > OR = 300 . The ADA recommends that at least two of three specimens collected within a 3-6 month period be abnormal before considering a patient to be within a diagnostic  category.    Cholesterol 11/03/2022 172  <200 mg/dL Final   HDL 47/82/9562 69  > OR = 40 mg/dL Final   Triglycerides 13/02/6577 59  <150 mg/dL Final   LDL Cholesterol (Calc) 11/03/2022 89  mg/dL (calc) Final   Comment: Reference range: <100 . Desirable range <100 mg/dL for primary prevention;   <70 mg/dL for patients with CHD or diabetic patients  with > or = 2 CHD risk factors. Marland Kitchen LDL-C is now calculated using the Martin-Hopkins  calculation, which is a validated novel method providing  better accuracy than the Friedewald equation in the  estimation of LDL-C.  Horald Pollen et al. Lenox Ahr. 4696;295(28): 2061-2068  (http://education.[facility].com/faq/FAQ164)    Total CHOL/HDL Ratio 11/03/2022 2.5  <4.1 (calc) Final   Non-HDL Cholesterol (Calc) 11/03/2022 103  <130 mg/dL (calc) Final   Comment: For patients with diabetes plus 1 major ASCVD risk  factor, treating to a non-HDL-C goal of <100 mg/dL  (LDL-C of <32 mg/dL) is considered a therapeutic  option.    Hgb A1c MFr Bld 11/03/2022 7.3 (H)  <5.7 % of total Hgb Final   Comment: For someone without known diabetes, a hemoglobin A1c value of 6.5% or greater indicates that they may have  diabetes and this should  be confirmed with a follow-up  test. . For someone with known diabetes, a value <7% indicates  that their diabetes is well controlled and a value  greater than or equal to 7% indicates suboptimal  control. A1c targets should be individualized based on  duration of diabetes, age, comorbid conditions, and  other considerations. . Currently, no consensus exists regarding use of hemoglobin A1c for diagnosis of diabetes for children. .    Mean Plasma Glucose 11/03/2022 163  mg/dL Final   eAG (mmol/L) 16/04/9603 9.0  mmol/L Final   Comment: . This test was performed on the Roche cobas c503 platform. Effective 04/19/22, a change in test platforms from the Abbott Architect to the Roche cobas c503 may have shifted HbA1c  results compared to historical results. Based on laboratory validation testing conducted at [facility], the Roche platform relative to the Abbott platform had an average increase in HbA1c value of < or = 0.3%. This difference is within accepted  variability established by the [facility]. Note that not all individuals will have had a shift in their results and direct comparisons between historical and current results for testing conducted on different platforms is not recommended.    Glucose, Bld 11/03/2022 107 (H)  65 - 99 mg/dL Final   Comment: .            Fasting reference interval . For someone without known diabetes, a glucose value between 100 and 125 mg/dL is consistent with prediabetes and should be confirmed with a follow-up test. .    BUN 11/03/2022 19  7 - 25 mg/dL Final   Creat 54/03/8118 0.87  0.70 - 1.28 mg/dL Final   eGFR 14/78/2956 91  > OR = 60 mL/min/1.30m2 Final   BUN/Creatinine Ratio 11/03/2022 SEE NOTE:  6 - 22 (calc) Final   Comment:    Not Reported: BUN and Creatinine are within    reference range. .    Sodium 11/03/2022 135  135 - 146 mmol/L Final   Potassium 11/03/2022 4.7  3.5 - 5.3 mmol/L Final   Chloride 11/03/2022 96 (L)  98 - 110 mmol/L Final   CO2 11/03/2022 28  20 - 32 mmol/L Final   Calcium 11/03/2022 9.9  8.6 - 10.3 mg/dL Final   Total Protein 21/30/8657 6.8  6.1 - 8.1 g/dL Final   Albumin 84/69/6295 4.4  3.6 - 5.1 g/dL Final   Globulin 28/41/3244 2.4  1.9 - 3.7 g/dL (calc) Final   AG Ratio 11/03/2022 1.8  1.0 - 2.5 (calc) Final   Total Bilirubin 11/03/2022 0.3  0.2 - 1.2 mg/dL Final   Alkaline phosphatase (APISO) 11/03/2022 56  35 - 144 U/L Final   AST 11/03/2022 22  10 - 35 U/L Final   ALT 11/03/2022 15  9 - 46 U/L Final   WBC 11/03/2022 5.4  3.8 - 10.8 Thousand/uL Final   RBC 11/03/2022 4.16 (L)  4.20 - 5.80 Million/uL Final   Hemoglobin 11/03/2022 12.8 (L)  13.2 - 17.1 g/dL Final   HCT 07/14/7251 38.4  (L)  38.5 - 50.0 % Final   MCV 11/03/2022 92.3  80.0 - 100.0 fL Final   MCH 11/03/2022 30.8  27.0 - 33.0 pg Final   MCHC 11/03/2022 33.3  32.0 - 36.0 g/dL Final   RDW 66/44/0347 12.7  11.0 - 15.0 % Final   Platelets 11/03/2022 275  140 - 400 Thousand/uL Final   MPV 11/03/2022 10.7  7.5 - 12.5 fL Final   Neutro Abs  11/03/2022 3,262  1,500 - 7,800 cells/uL Final   Lymphs Abs 11/03/2022 1,226  850 - 3,900 cells/uL Final   Absolute Monocytes 11/03/2022 632  200 - 950 cells/uL Final   Eosinophils Absolute 11/03/2022 243  15 - 500 cells/uL Final   Basophils Absolute 11/03/2022 38  0 - 200 cells/uL Final   Neutrophils Relative % 11/03/2022 60.4  % Final   Total Lymphocyte 11/03/2022 22.7  % Final   Monocytes Relative 11/03/2022 11.7  % Final   Eosinophils Relative 11/03/2022 4.5  % Final   Basophils Relative 11/03/2022 0.7  % Final    Immunization History  Administered Date(s) Administered   Influenza, High Dose Seasonal PF 06/12/2021   Influenza,inj,Quad PF,6+ Mos 04/04/2018   PFIZER(Purple Top)SARS-COV-2 Vaccination 08/06/2019, 08/27/2019   Pneumococcal Conjugate-13 11/16/2016   Pneumococcal Polysaccharide-23 01/31/2015   Tdap 09/25/2013, 08/09/2015   Zoster, Live 11/06/2013   Past Medical History:  Diagnosis Date   Acute meniscal tear of left knee    DDD (degenerative disc disease), lumbar    has received ESI x 3   Diabetes mellitus without complication (HCC)    GERD (gastroesophageal reflux disease)    History of blood transfusion    History of staph infection    after thumb surgery   Hyperlipidemia    Left rotator cuff tear    Primary localized osteoarthritis of left knee    Past Surgical History:  Procedure Laterality Date   CERVICAL FUSION     COLONOSCOPY     ESOPHAGOGASTRODUODENOSCOPY (EGD) WITH PROPOFOL N/A 07/24/2021   Procedure: ESOPHAGOGASTRODUODENOSCOPY (EGD) WITH PROPOFOL;  Surgeon: Jeani Hawking, MD;  Location: [facility];  Service: Endoscopy;  Laterality:  N/A;   FOOT SURGERY Right    JOINT REPLACEMENT N/A    Phreesia 12/02/2019   KNEE ARTHROSCOPY Bilateral    LAMINOTOMY     C7-T1 (2023) right C8 radiculopathy Sherilyn Cooter Pool)   SHOULDER ARTHROSCOPY Left    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   TOTAL KNEE ARTHROPLASTY Right 02/26/2019   Procedure: TOTAL KNEE ARTHROPLASTY;  Surgeon: Salvatore Marvel, MD;  Location: [facility];  Service: Orthopedics;  Laterality: Right;   UPPER ESOPHAGEAL ENDOSCOPIC ULTRASOUND (EUS) N/A 07/24/2021   Procedure: UPPER ESOPHAGEAL ENDOSCOPIC ULTRASOUND (EUS);  Surgeon: Jeani Hawking, MD;  Location: [facility];  Service: Endoscopy;  Laterality: N/A;     Current Outpatient Medications on File Prior to Visit  Medication Sig Dispense Refill   aspirin EC 81 MG tablet Take 81 mg by mouth daily. Swallow whole.     atorvastatin (LIPITOR) 20 MG tablet TAKE 1 TABLET BY MOUTH EVERY DAY 90 tablet 1   CREON 36000-114000 units CPEP capsule Take 36,000 Units by mouth 3 (three) times daily with meals.     hypromellose (SYSTANE OVERNIGHT THERAPY) 0.3 % GEL ophthalmic ointment Place 1 application into both eyes at bedtime.     ketotifen (ZADITOR) 0.025 % ophthalmic solution Place 1 drop into both eyes 2 (two) times daily.     metFORMIN (GLUCOPHAGE) 1000 MG tablet TAKE 1 TABLET (1,000 MG TOTAL) BY MOUTH 2 (TWO) TIMES DAILY WITH A MEAL. 180 tablet 3   Misc Natural Products (OSTEO BI-FLEX ADV JOINT SHIELD PO) Take 1 tablet by mouth in the morning and at bedtime.     Multiple Vitamin (MULTIVITAMIN WITH MINERALS) TABS tablet Take 1 tablet by mouth daily.     omeprazole (PRILOSEC) 40 MG capsule TAKE 1 CAPSULE BY MOUTH EVERY DAY (Patient taking differently: Take  40 mg by mouth daily as needed (acid reflux).) 30 capsule 11   pioglitazone (ACTOS) 30 MG tablet TAKE 1 TABLET BY MOUTH EVERY DAY 30 tablet 11   predniSONE (DELTASONE) 20 MG tablet 3 tabs poqday 1-2, 2 tabs poqday 3-4, 1 tab poqday 5-6 12 tablet 0   Propylene Glycol  (SYSTANE BALANCE) 0.6 % SOLN Place 1 drop into both eyes in the morning and at bedtime.     sitaGLIPtin (JANUVIA) 100 MG tablet Take 1 tablet (100 mg total) by mouth daily. 90 tablet 3   triamcinolone (NASACORT) 55 MCG/ACT AERO nasal inhaler Place 2 sprays into the nose daily. 1 each 12   No current facility-administered medications on file prior to visit.   No Known Allergies Social History   Socioeconomic History   Marital status: Married    Spouse name: Not on file   Number of children: Not on file   Years of education: Not on file   Highest education level: Not on file  Occupational History   Not on file  Tobacco Use   Smoking status: Never   Smokeless tobacco: Never  Vaping Use   Vaping Use: Never used  Substance and Sexual Activity   Alcohol use: No   Drug use: No   Sexual activity: Yes    Comment: married, works in Aeronautical engineer, 3 children who are grown  Other Topics Concern   Not on file  Social History Narrative   Not on file   Social Determinants of Health   Financial Resource Strain: Low Risk  (12/12/2020)   Overall Financial Resource Strain (CARDIA)    Difficulty of Paying Living Expenses: Not hard at all  Food Insecurity: No Food Insecurity (12/12/2020)   Hunger Vital Sign    Worried About Running Out of Food in the Last Year: Never true    Ran Out of Food in the Last Year: Never true  Transportation Needs: No Transportation Needs (12/12/2020)   PRAPARE - Administrator, Civil Service (Medical): No    Lack of Transportation (Non-Medical): No  Physical Activity: Inactive (12/12/2020)   Exercise Vital Sign    Days of Exercise per Week: 0 days    Minutes of Exercise per Session: 0 min  Stress: No Stress Concern Present (12/12/2020)   [employer] of Occupational Health - Occupational Stress Questionnaire    Feeling of Stress : Not at all  Social Connections: Moderately Integrated (12/12/2020)   Social Connection and Isolation Panel [NHANES]     Frequency of Communication with Friends and Family: Three times a week    Frequency of Social Gatherings with Friends and Family: More than three times a week    Attends Religious Services: More than 4 times per year    Active Member of [employer] or Organizations: No    Attends Banker Meetings: Never    Marital Status: Married  Catering manager Violence: Not At Risk (12/12/2020)   Humiliation, Afraid, Rape, and Kick questionnaire    Fear of Current or Ex-Partner: No    Emotionally Abused: No    Physically Abused: No    Sexually Abused: No   Family History  Problem Relation Age of Onset   Heart disease Father    Asthma Sister    Arthritis Sister      Review of Systems     Objective:   Physical Exam Vitals reviewed.  Constitutional:      General: He is not in acute distress.  Appearance: Normal appearance. He is normal weight.  Neck:     Vascular: No carotid bruit.  Cardiovascular:     Rate and Rhythm: Normal rate and regular rhythm.     Pulses: Normal pulses.     Heart sounds: Normal heart sounds. No murmur heard. Pulmonary:     Effort: Pulmonary effort is normal. No respiratory distress.     Breath sounds: Normal breath sounds. No wheezing, rhonchi or rales.  Abdominal:     General: Abdomen is flat. Bowel sounds are normal. There is no distension.     Palpations: Abdomen is soft.     Tenderness: There is no abdominal tenderness. There is no guarding.  Musculoskeletal:     Cervical back: Neck supple.  Lymphadenopathy:     Cervical: No cervical adenopathy.  Skin:    Coloration: Skin is not jaundiced.     Findings: No bruising, lesion or rash.  Neurological:     General: No focal deficit present.     Mental Status: He is alert and oriented to person, place, and time.     Cranial Nerves: No cranial nerve deficit.     Motor: No weakness.     Gait: Gait normal.           Assessment & Plan:  Fatigue, unspecified type - Plan: TSH, Testosterone  Total,Free,Bio, Males  Need for zoster vaccine - Plan: Varicella-zoster vaccine IM  Screening for prostate cancer - Plan: PSA  Type 2 diabetes mellitus without complication, without long-term current use of insulin (HCC)  Encounter for Medicare annual wellness exam Patient received his second shingles vaccine today.  The remainder of his vaccinations are up-to-date.  We did discuss COVID and RSV.  Patient defers these at the present time his tetanus shot is up-to-date.  His colonoscopy is up-to-date.  I will add a PSA to screen for prostate cancer.  Blood pressure today is excellent.  Due to his fatigue I will check a testosterone and TSH.  We discussed adding Januvia for his blood sugars however he believes that he can lower his A1c with diet.  I would like to see his A1c below 7.

## 2022-11-09 LAB — TESTOSTERONE TOTAL,FREE,BIO, MALES
Albumin: 4.5 g/dL (ref 3.6–5.1)
Sex Hormone Binding: 82 nmol/L — ABNORMAL HIGH (ref 22–77)
Testosterone, Bioavailable: 61 ng/dL (ref 15.0–150.0)
Testosterone, Free: 29.7 pg/mL (ref 6.0–73.0)
Testosterone: 502 ng/dL (ref 250–827)

## 2022-11-09 LAB — TSH: TSH: 1.05 mIU/L (ref 0.40–4.50)

## 2022-11-09 LAB — PSA: PSA: 0.43 ng/mL (ref <=4.00)

## 2022-12-01 ENCOUNTER — Other Ambulatory Visit: Payer: Self-pay | Admitting: Family Medicine

## 2022-12-01 NOTE — Telephone Encounter (Signed)
Requested Prescriptions  Pending Prescriptions Disp Refills   atorvastatin (LIPITOR) 20 MG tablet [Pharmacy Med Name: ATORVASTATIN 20 MG TABLET] 90 tablet 1    Sig: TAKE 1 TABLET BY MOUTH EVERY DAY     Cardiovascular:  Antilipid - Statins Failed - 12/01/2022  2:32 AM      Failed - Valid encounter within last 12 months    Recent Outpatient Visits           1 year ago DDD (degenerative disc disease), lumbar   [facility] Donita Brooks, MD   1 year ago DDD (degenerative disc disease), lumbar   [facility] Donita Brooks, MD   1 year ago DDD (degenerative disc disease), lumbar   [facility] Tanya Nones, Priscille Heidelberg, MD   1 year ago Weight loss   [facility] Medicine Donita Brooks, MD   1 year ago Controlled type 2 diabetes mellitus without complication, without long-term current use of insulin (HCC)   [facility] Medicine Pickard, Priscille Heidelberg, MD              Failed - Lipid Panel in normal range within the last 12 months    Cholesterol  Date Value Ref Range Status  11/03/2022 172 <200 mg/dL Final   LDL Cholesterol (Calc)  Date Value Ref Range Status  11/03/2022 89 mg/dL (calc) Final    Comment:    Reference range: <100 . Desirable range <100 mg/dL for primary prevention;   <70 mg/dL for patients with CHD or diabetic patients  with > or = 2 CHD risk factors. Marland Kitchen LDL-C is now calculated using the Martin-Hopkins  calculation, which is a validated novel method providing  better accuracy than the Friedewald equation in the  estimation of LDL-C.  Horald Pollen et al. Lenox Ahr. 1478;295(62): 2061-2068  (http://education.[facility].com/faq/FAQ164)    HDL  Date Value Ref Range Status  11/03/2022 69 > OR = 40 mg/dL Final   Triglycerides  Date Value Ref Range Status  11/03/2022 59 <150 mg/dL Final         Passed - Patient is not pregnant

## 2023-01-17 ENCOUNTER — Other Ambulatory Visit: Payer: Self-pay | Admitting: Family Medicine

## 2023-01-17 MED ORDER — METFORMIN HCL 1000 MG PO TABS: 1000.0000 mg | ORAL_TABLET | Freq: Two times a day (BID) | ORAL | 0 refills | Status: DC

## 2023-01-17 MED ORDER — PIOGLITAZONE HCL 30 MG PO TABS: 30.0000 mg | ORAL_TABLET | Freq: Every day | ORAL | 0 refills | Status: DC

## 2023-02-01 ENCOUNTER — Other Ambulatory Visit: Payer: Self-pay | Admitting: Family Medicine

## 2023-02-02 NOTE — Telephone Encounter (Signed)
Requested Prescriptions  Pending Prescriptions Disp Refills   metFORMIN (GLUCOPHAGE) 1000 MG tablet [Pharmacy Med Name: METFORMIN HCL 1,000 MG TABLET] 180 tablet 3    Sig: TAKE 1 TABLET (1,000 MG TOTAL) BY MOUTH TWICE A DAY WITH FOOD     Endocrinology:  Diabetes - Biguanides Failed - 02/01/2023  2:43 AM      Failed - Valid encounter within last 6 months    Recent Outpatient Visits           1 year ago DDD (degenerative disc disease), lumbar   [facility] Donita Brooks, MD   1 year ago DDD (degenerative disc disease), lumbar   [facility] Donita Brooks, MD   1 year ago DDD (degenerative disc disease), lumbar   [facility] Donita Brooks, MD   1 year ago Weight loss   [facility] Medicine Tanya Nones, Priscille Heidelberg, MD   2 years ago Controlled type 2 diabetes mellitus without complication, without long-term current use of insulin (HCC)   [facility] Medicine Pickard, Priscille Heidelberg, MD              Passed - Cr in normal range and within 360 days    Creat  Date Value Ref Range Status  11/03/2022 0.87 0.70 - 1.28 mg/dL Final   Creatinine, Urine  Date Value Ref Range Status  11/03/2022 97 20 - 320 mg/dL Final         Passed - HBA1C is between 0 and 7.9 and within 180 days    Hgb A1c MFr Bld  Date Value Ref Range Status  11/03/2022 7.3 (H) <5.7 % of total Hgb Final    Comment:    For someone without known diabetes, a hemoglobin A1c value of 6.5% or greater indicates that they may have  diabetes and this should be confirmed with a follow-up  test. . For someone with known diabetes, a value <7% indicates  that their diabetes is well controlled and a value  greater than or equal to 7% indicates suboptimal  control. A1c targets should be individualized based on  duration of diabetes, age, comorbid conditions, and  other considerations. . Currently, no consensus exists regarding use of hemoglobin A1c for  diagnosis of diabetes for children. .          Passed - eGFR in normal range and within 360 days    GFR, Est African American  Date Value Ref Range Status  01/02/2021 101 > OR = 60 mL/min/1.41m2 Final   GFR, Est Non African American  Date Value Ref Range Status  01/02/2021 87 > OR = 60 mL/min/1.32m2 Final   eGFR  Date Value Ref Range Status  11/03/2022 91 > OR = 60 mL/min/1.33m2 Final         Passed - B12 Level in normal range and within 720 days    Vitamin B-12  Date Value Ref Range Status  11/03/2022 342 200 - 1,100 pg/mL Final    Comment:    . Please Note: Although the reference range for vitamin B12 is [phone] pg/mL, it has been reported that between 5 and 10% of patients with values between 200 and 400 pg/mL may experience neuropsychiatric and hematologic abnormalities due to occult B12 deficiency; less than 1% of patients with values above 400 pg/mL will have symptoms. .          Passed - CBC within normal limits and completed in the last 12 months  WBC  Date Value Ref Range Status  11/03/2022 5.4 3.8 - 10.8 Thousand/uL Final   RBC  Date Value Ref Range Status  11/03/2022 4.16 (L) 4.20 - 5.80 Million/uL Final   Hemoglobin  Date Value Ref Range Status  11/03/2022 12.8 (L) 13.2 - 17.1 g/dL Final   HCT  Date Value Ref Range Status  11/03/2022 38.4 (L) 38.5 - 50.0 % Final   MCHC  Date Value Ref Range Status  11/03/2022 33.3 32.0 - 36.0 g/dL Final   [facility]  Date Value Ref Range Status  11/03/2022 30.8 27.0 - 33.0 pg Final   MCV  Date Value Ref Range Status  11/03/2022 92.3 80.0 - 100.0 fL Final   No results found for: "PLTCOUNTKUC", "LABPLAT", "POCPLA" RDW  Date Value Ref Range Status  11/03/2022 12.7 11.0 - 15.0 % Final

## 2023-02-12 ENCOUNTER — Other Ambulatory Visit: Payer: Self-pay | Admitting: Family Medicine

## 2023-03-01 ENCOUNTER — Other Ambulatory Visit: Payer: Self-pay | Admitting: Family Medicine

## 2023-03-02 NOTE — Telephone Encounter (Signed)
Requested Prescriptions  Pending Prescriptions Disp Refills   atorvastatin (LIPITOR) 20 MG tablet [Pharmacy Med Name: ATORVASTATIN 20 MG TABLET] 90 tablet 1    Sig: TAKE 1 TABLET BY MOUTH EVERY DAY     Cardiovascular:  Antilipid - Statins Failed - 03/01/2023  5:27 PM      Failed - Valid encounter within last 12 months    Recent Outpatient Visits           1 year ago DDD (degenerative disc disease), lumbar   [facility] Donita Brooks, MD   1 year ago DDD (degenerative disc disease), lumbar   [facility] Donita Brooks, MD   1 year ago DDD (degenerative disc disease), lumbar   [facility] Tanya Nones, Priscille Heidelberg, MD   1 year ago Weight loss   [facility] Medicine Donita Brooks, MD   2 years ago Controlled type 2 diabetes mellitus without complication, without long-term current use of insulin (HCC)   [facility] Medicine Pickard, Priscille Heidelberg, MD              Failed - Lipid Panel in normal range within the last 12 months    Cholesterol  Date Value Ref Range Status  11/03/2022 172 <200 mg/dL Final   LDL Cholesterol (Calc)  Date Value Ref Range Status  11/03/2022 89 mg/dL (calc) Final    Comment:    Reference range: <100 . Desirable range <100 mg/dL for primary prevention;   <70 mg/dL for patients with CHD or diabetic patients  with > or = 2 CHD risk factors. Marland Kitchen LDL-C is now calculated using the Martin-Hopkins  calculation, which is a validated novel method providing  better accuracy than the Friedewald equation in the  estimation of LDL-C.  Horald Pollen et al. Lenox Ahr. 7425;956(38): 2061-2068  (http://education.[facility].com/faq/FAQ164)    HDL  Date Value Ref Range Status  11/03/2022 69 > OR = 40 mg/dL Final   Triglycerides  Date Value Ref Range Status  11/03/2022 59 <150 mg/dL Final         Passed - Patient is not pregnant

## 2023-04-21 ENCOUNTER — Other Ambulatory Visit: Payer: Self-pay | Admitting: Family Medicine

## 2023-04-21 NOTE — Telephone Encounter (Signed)
Requested by interface surescripts. Last OV 11/08/22.  Requested Prescriptions  Pending Prescriptions Disp Refills   pioglitazone (ACTOS) 30 MG tablet [Pharmacy Med Name: PIOGLITAZONE HCL 30 MG TABLET] 30 tablet 11    Sig: TAKE 1 TABLET BY MOUTH EVERY DAY     Endocrinology:  Diabetes - Glitazones - pioglitazone Failed - 04/21/2023 10:12 AM      Failed - Valid encounter within last 6 months    Recent Outpatient Visits           1 year ago DDD (degenerative disc disease), lumbar   [facility] Donita Brooks, MD   1 year ago DDD (degenerative disc disease), lumbar   [facility] Donita Brooks, MD   1 year ago DDD (degenerative disc disease), lumbar   [facility] Medicine Donita Brooks, MD   2 years ago Weight loss   [facility] Medicine Tanya Nones, Priscille Heidelberg, MD   2 years ago Controlled type 2 diabetes mellitus without complication, without long-term current use of insulin (HCC)   [facility] Pickard, Priscille Heidelberg, MD              Passed - HBA1C is between 0 and 7.9 and within 180 days    Hgb A1c MFr Bld  Date Value Ref Range Status  11/03/2022 7.3 (H) <5.7 % of total Hgb Final    Comment:    For someone without known diabetes, a hemoglobin A1c value of 6.5% or greater indicates that they may have  diabetes and this should be confirmed with a follow-up  test. . For someone with known diabetes, a value <7% indicates  that their diabetes is well controlled and a value  greater than or equal to 7% indicates suboptimal  control. A1c targets should be individualized based on  duration of diabetes, age, comorbid conditions, and  other considerations. . Currently, no consensus exists regarding use of hemoglobin A1c for diagnosis of diabetes for children. Marland Kitchen

## 2023-05-04 ENCOUNTER — Other Ambulatory Visit: Payer: Self-pay | Admitting: Family Medicine

## 2023-05-06 NOTE — Telephone Encounter (Signed)
Requested medications are due for refill today.  Unsure  Requested medications are on the active medications list.  no  Last refill. 03/05/2022  Future visit scheduled.   no  Notes to clinic.  Refill/refusal not delegated.    Requested Prescriptions  Pending Prescriptions Disp Refills   amoxicillin (AMOXIL) 875 MG tablet [Pharmacy Med Name: AMOXICILLIN 875 MG TABLET] 20 tablet 0    Sig: TAKE 1 TABLET BY MOUTH TWICE A DAY FOR 10 DAYS     Off-Protocol Failed - 05/04/2023 12:58 PM      Failed - Medication not assigned to a protocol, review manually.      Failed - Valid encounter within last 12 months    Recent Outpatient Visits           1 year ago DDD (degenerative disc disease), lumbar   [facility] Pickard, Priscille Heidelberg, MD   1 year ago DDD (degenerative disc disease), lumbar   [facility] Tanya Nones Priscille Heidelberg, MD   1 year ago DDD (degenerative disc disease), lumbar   [facility] Donita Brooks, MD   2 years ago Weight loss   [facility] Donita Brooks, MD   2 years ago Controlled type 2 diabetes mellitus without complication, without long-term current use of insulin (HCC)   [facility] Pickard, Priscille Heidelberg, MD

## 2023-06-03 ENCOUNTER — Telehealth: Payer: Self-pay

## 2023-06-03 NOTE — Patient Outreach (Signed)
Attempted to contact patient regarding dm eye. Left voicemail for patient to return my call at [phone].  Nicholes Rough, CMA Care Guide [employer]

## 2023-06-06 ENCOUNTER — Telehealth: Payer: Self-pay

## 2023-06-06 NOTE — Patient Outreach (Signed)
Attempted to contact patient regarding care gaps. Left voicemail for patient to return my call at [phone].  Nicholes Rough, CMA Care Guide [employer]

## 2023-06-11 ENCOUNTER — Other Ambulatory Visit: Payer: Self-pay | Admitting: Family Medicine

## 2023-06-14 ENCOUNTER — Telehealth: Payer: Self-pay

## 2023-06-14 MED ORDER — ATORVASTATIN CALCIUM 20 MG PO TABS: 20.0000 mg | ORAL_TABLET | Freq: Every day | ORAL | 1 refills | Status: DC

## 2023-06-14 NOTE — Telephone Encounter (Signed)
Copied from CRM [id number]. Topic: Clinical - Medication Refill >> Jun 14, 2023  8:20 AM Orinda Kenner C wrote: Most Recent Primary Care Visit:  Provider: Lynnea Ferrier T  Department: [facility]  Visit Type: MEDICARE WELL VISIT 45  Date: 11/08/2022  Medication: atorvastatin (LIPITOR) 20 MG tablet  Has the patient contacted their pharmacy? Yes (Agent: If no, request that the patient contact the pharmacy for the refill. If patient does not wish to contact the pharmacy document the reason why and proceed with request.) (Agent: If yes, when and what did the pharmacy advise?) Pharmacy states refills is denied and contact provider's office   Is this the correct pharmacy for this prescription? Yes If no, delete pharmacy and type the correct one.  This is the patient's preferred pharmacy:  [id number] [place], [place] - [address] [place] [address] Phone: [phone] Fax: [phone]   Has the prescription been filled recently?   Is the patient out of the medication? Yes, pt's wife Dois Davenport states pt has 7 pills left  Has the patient been seen for an appointment in the last year OR does the patient have an upcoming appointment?   Can we respond through MyChart? No, pls c/b [phone] they will be in until 9:15 am today  Agent: Please be advised that Rx refills may take up to 3 business days. We ask that you follow-up with your pharmacy.

## 2023-06-14 NOTE — Telephone Encounter (Signed)
atorvastatin (LIPITOR) 20 MG tablet 90 tablet 1 06/14/2023 --   Sig - Route: Take 1 tablet (20 mg total) by mouth daily. - Oral   Sent to pharmacy as: atorvastatin (LIPITOR) 20 MG tablet   E-Prescribing Status: Receipt confirmed by pharmacy (06/14/2023  9:10 AM EST)     Requested Prescriptions  Pending Prescriptions Disp Refills   atorvastatin (LIPITOR) 20 MG tablet [Pharmacy Med Name: ATORVASTATIN 20 MG TABLET] 90 tablet 1    Sig: TAKE 1 TABLET BY MOUTH EVERY DAY     Cardiovascular:  Antilipid - Statins Failed - 06/11/2023 11:11 AM      Failed - Valid encounter within last 12 months    Recent Outpatient Visits           1 year ago DDD (degenerative disc disease), lumbar   [facility] Pickard, Priscille Heidelberg, MD   1 year ago DDD (degenerative disc disease), lumbar   [facility] Pickard, Priscille Heidelberg, MD   1 year ago DDD (degenerative disc disease), lumbar   [facility] Donita Brooks, MD   2 years ago Weight loss   [facility] Donita Brooks, MD   2 years ago Controlled type 2 diabetes mellitus without complication, without long-term current use of insulin (HCC)   [facility] Pickard, Priscille Heidelberg, MD              Failed - Lipid Panel in normal range within the last 12 months    Cholesterol  Date Value Ref Range Status  11/03/2022 172 <200 mg/dL Final   LDL Cholesterol (Calc)  Date Value Ref Range Status  11/03/2022 89 mg/dL (calc) Final    Comment:    Reference range: <100 . Desirable range <100 mg/dL for primary prevention;   <70 mg/dL for patients with CHD or diabetic patients  with > or = 2 CHD risk factors. Marland Kitchen LDL-C is now calculated using the Martin-Hopkins  calculation, which is a validated novel method providing  better accuracy than the Friedewald equation in the  estimation of LDL-C.  Horald Pollen et al. Lenox Ahr. 8119;147(82): 2061-2068   (http://education.[facility].com/faq/FAQ164)    HDL  Date Value Ref Range Status  11/03/2022 69 > OR = 40 mg/dL Final   Triglycerides  Date Value Ref Range Status  11/03/2022 59 <150 mg/dL Final         Passed - Patient is not pregnant

## 2023-06-23 ENCOUNTER — Encounter: Payer: Self-pay | Admitting: Family Medicine

## 2023-08-09 ENCOUNTER — Ambulatory Visit: Payer: Medicare Other | Admitting: Family Medicine

## 2023-08-09 ENCOUNTER — Encounter: Payer: Self-pay | Admitting: Family Medicine

## 2023-08-09 VITALS — BP 120/76 | HR 94 | Temp 98.2°F | Ht 70.0 in | Wt 175.0 lb

## 2023-08-09 DIAGNOSIS — Z7984 Long term (current) use of oral hypoglycemic drugs: Secondary | ICD-10-CM | POA: Diagnosis not present

## 2023-08-09 DIAGNOSIS — E119 Type 2 diabetes mellitus without complications: Secondary | ICD-10-CM

## 2023-08-09 DIAGNOSIS — Z125 Encounter for screening for malignant neoplasm of prostate: Secondary | ICD-10-CM | POA: Diagnosis not present

## 2023-08-09 DIAGNOSIS — Z23 Encounter for immunization: Secondary | ICD-10-CM

## 2023-08-09 NOTE — Progress Notes (Signed)
Subjective:    Patient ID: Todd Lawrence, male    DOB: Jan 20, 1951, 73 y.o.   MRN: [id number]  Cough  Patient is overdue for fasting lab work.  He is also due for the new pneumonia vaccine, capvaxive.  Patient has a history of type 2 diabetes mellitus.  He denies any chest pain shortness of breath or dyspnea on exertion.  He is not regularly checking his blood sugar.  However since switching from Jardiance to Actos he has gained weight.  He is interested in switching back to [place].  He is overdue for prostate cancer screening.  He does report a chronic cough that has been present for a long time.  Is nonproductive.  Is more of a tickle in the back of his throat that will not go away.   Immunization History  Administered Date(s) Administered   Fluad Quad(high Dose 65+) 06/18/2022   Influenza, High Dose Seasonal PF 06/12/2021, 05/11/2023   Influenza,inj,Quad PF,6+ Mos 04/04/2018   PFIZER(Purple Top)SARS-COV-2 Vaccination 08/06/2019, 08/27/2019   Pneumococcal Conjugate Pcv21, Polysaccharide Crm197 Conjugaf 08/09/2023   Pneumococcal Conjugate-13 11/16/2016   Pneumococcal Polysaccharide-23 01/31/2015   Tdap 09/25/2013, 08/09/2015   Zoster Recombinant(Shingrix) 11/08/2022   Zoster, Live 11/06/2013   Past Medical History:  Diagnosis Date   Acute meniscal tear of left knee    DDD (degenerative disc disease), lumbar    has received ESI x 3   Diabetes mellitus without complication (HCC)    GERD (gastroesophageal reflux disease)    History of blood transfusion    History of staph infection    after thumb surgery   Hyperlipidemia    Left rotator cuff tear    Primary localized osteoarthritis of left knee    Past Surgical History:  Procedure Laterality Date   CERVICAL FUSION     COLONOSCOPY     ESOPHAGOGASTRODUODENOSCOPY (EGD) WITH PROPOFOL N/A 07/24/2021   Procedure: ESOPHAGOGASTRODUODENOSCOPY (EGD) WITH PROPOFOL;  Surgeon: Jeani Hawking, MD;  Location: [facility];  Service:  Endoscopy;  Laterality: N/A;   FOOT SURGERY Right    JOINT REPLACEMENT N/A    Phreesia 12/02/2019   KNEE ARTHROSCOPY Bilateral    LAMINOTOMY     C7-T1 (2023) right C8 radiculopathy Sherilyn Cooter Pool)   SHOULDER ARTHROSCOPY Left    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   TOTAL KNEE ARTHROPLASTY Right 02/26/2019   Procedure: TOTAL KNEE ARTHROPLASTY;  Surgeon: Salvatore Marvel, MD;  Location: [facility];  Service: Orthopedics;  Laterality: Right;   UPPER ESOPHAGEAL ENDOSCOPIC ULTRASOUND (EUS) N/A 07/24/2021   Procedure: UPPER ESOPHAGEAL ENDOSCOPIC ULTRASOUND (EUS);  Surgeon: Jeani Hawking, MD;  Location: [facility];  Service: Endoscopy;  Laterality: N/A;     Current Outpatient Medications on File Prior to Visit  Medication Sig Dispense Refill   aspirin EC 81 MG tablet Take 81 mg by mouth daily. Swallow whole.     atorvastatin (LIPITOR) 20 MG tablet Take 1 tablet (20 mg total) by mouth daily. 90 tablet 1   CREON 36000-114000 units CPEP capsule Take 36,000 Units by mouth 3 (three) times daily with meals.     hypromellose (SYSTANE OVERNIGHT THERAPY) 0.3 % GEL ophthalmic ointment Place 1 application into both eyes at bedtime.     ketotifen (ZADITOR) 0.025 % ophthalmic solution Place 1 drop into both eyes 2 (two) times daily.     metFORMIN (GLUCOPHAGE) 1000 MG tablet TAKE 1 TABLET (1,000 MG TOTAL) BY MOUTH TWICE A DAY WITH FOOD 180 tablet 3  Misc Natural Products (OSTEO BI-FLEX ADV JOINT SHIELD PO) Take 1 tablet by mouth in the morning and at bedtime.     Multiple Vitamin (MULTIVITAMIN WITH MINERALS) TABS tablet Take 1 tablet by mouth daily.     omeprazole (PRILOSEC) 40 MG capsule TAKE 1 CAPSULE BY MOUTH EVERY DAY (Patient taking differently: Take 40 mg by mouth daily as needed (acid reflux).) 30 capsule 11   pioglitazone (ACTOS) 30 MG tablet TAKE 1 TABLET BY MOUTH EVERY DAY 30 tablet 11   Propylene Glycol (SYSTANE BALANCE) 0.6 % SOLN Place 1 drop into both eyes in the morning and at  bedtime.     sitaGLIPtin (JANUVIA) 100 MG tablet Take 1 tablet (100 mg total) by mouth daily. 90 tablet 3   No current facility-administered medications on file prior to visit.   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  Other Topics Concern   Not on file  Social History Narrative   Not on file   Social Drivers of Health   Financial Resource Strain: Low Risk  (12/12/2020)   Overall Financial Resource Strain (CARDIA)    Difficulty of Paying Living Expenses: Not hard at all  Food Insecurity: No Food Insecurity (12/12/2020)   Hunger Vital Sign    Worried About Running Out of Food in the Last Year: Never true    Ran Out of Food in the Last Year: Never true  Transportation Needs: No Transportation Needs (12/12/2020)   PRAPARE - Administrator, Civil Service (Medical): No    Lack of Transportation (Non-Medical): No  Physical Activity: Inactive (12/12/2020)   Exercise Vital Sign    Days of Exercise per Week: 0 days    Minutes of Exercise per Session: 0 min  Stress: No Stress Concern Present (12/12/2020)   [employer] of Occupational Health - Occupational Stress Questionnaire    Feeling of Stress : Not at all  Social Connections: Moderately Integrated (12/12/2020)   Social Connection and Isolation Panel [NHANES]    Frequency of Communication with Friends and Family: Three times a week    Frequency of Social Gatherings with Friends and Family: More than three times a week    Attends Religious Services:  More than 4 times per year    Active Member of [employer] or Organizations: No    Attends Banker Meetings: Never    Marital Status: Married  Catering manager Violence: Not At Risk (12/12/2020)   Humiliation, Afraid, Rape, and Kick questionnaire    Fear of Current or Ex-Partner: No    Emotionally Abused: No    Physically Abused: No    Sexually Abused: No   Family History  Problem Relation Age of Onset   Heart disease Father    Asthma Sister    Arthritis Sister      Review of Systems  Respiratory:  Positive for cough.  Objective:   Physical Exam Vitals reviewed.  Constitutional:      General: He is not in acute distress.    Appearance: Normal appearance. He is normal weight.  Neck:     Vascular: No carotid bruit.  Cardiovascular:     Rate and Rhythm: Normal rate and regular rhythm.     Pulses: Normal pulses.     Heart sounds: Normal heart sounds. No murmur heard. Pulmonary:     Effort: Pulmonary effort is normal. No respiratory distress.     Breath sounds: Normal breath sounds. No wheezing, rhonchi or rales.  Abdominal:     General: Abdomen is flat. Bowel sounds are normal. There is no distension.     Palpations: Abdomen is soft.     Tenderness: There is no abdominal tenderness. There is no guarding.  Musculoskeletal:     Cervical back: Neck supple.  Lymphadenopathy:     Cervical: No cervical adenopathy.  Skin:    Coloration: Skin is not jaundiced.     Findings: No bruising, lesion or rash.  Neurological:     General: No focal deficit present.     Mental Status: He is alert and oriented to person, place, and time.     Cranial Nerves: No cranial nerve deficit.     Motor: No weakness.     Gait: Gait normal.           Assessment & Plan:  Type 2 diabetes mellitus without complication, without long-term current use of insulin (HCC) - Plan: Hemoglobin A1c, CBC with Differential/Platelet, COMPLETE METABOLIC PANEL WITH GFR, Lipid panel, CT CARDIAC  SCORING (SELF PAY ONLY)  Prostate cancer screening - Plan: PSA  Need for vaccination - Plan: Pneumococcal Conjugate PCV21(Capvaxive) Patient received capvaxive today.  Blood pressure is excellent.  I will check a CBC a CMP and a lipid panel and a hemoglobin A1c.  Goal hemoglobin A1c is less than 7.  As long as lab work is acceptable, I plan to switch Actos back to [place].  I also discussed a coronary artery calcium score and the patient would like to obtain this to risk stratify himself further to determine if he requires more aggressive treatment for coronary artery disease.  Screen for prostate cancer with a PSA.

## 2023-08-10 LAB — CBC WITH DIFFERENTIAL/PLATELET
Absolute Lymphocytes: 1356 {cells}/uL (ref 850–3900)
Absolute Monocytes: 726 {cells}/uL (ref 200–950)
Basophils Absolute: 30 {cells}/uL (ref 0–200)
Basophils Relative: 0.5 %
Eosinophils Absolute: 222 {cells}/uL (ref 15–500)
Eosinophils Relative: 3.7 %
HCT: 40.3 % (ref 38.5–50.0)
Hemoglobin: 13.4 g/dL (ref 13.2–17.1)
MCH: 31.2 pg (ref 27.0–33.0)
MCHC: 33.3 g/dL (ref 32.0–36.0)
MCV: 93.7 fL (ref 80.0–100.0)
MPV: 10.6 fL (ref 7.5–12.5)
Monocytes Relative: 12.1 %
Neutro Abs: 3666 {cells}/uL (ref 1500–7800)
Neutrophils Relative %: 61.1 %
Platelets: 276 10*3/uL (ref 140–400)
RBC: 4.3 10*6/uL (ref 4.20–5.80)
RDW: 12.8 % (ref 11.0–15.0)
Total Lymphocyte: 22.6 %
WBC: 6 10*3/uL (ref 3.8–10.8)

## 2023-08-10 LAB — LIPID PANEL
Cholesterol: 176 mg/dL (ref <200)
HDL: 77 mg/dL (ref >=40)
LDL Cholesterol (Calc): 85 mg/dL
Non-HDL Cholesterol (Calc): 99 mg/dL (ref <130)
Total CHOL/HDL Ratio: 2.3 (calc) (ref <5.0)
Triglycerides: 62 mg/dL (ref <150)

## 2023-08-10 LAB — HEMOGLOBIN A1C
Hgb A1c MFr Bld: 7.3 %{Hb} — ABNORMAL HIGH (ref <5.7)
Mean Plasma Glucose: 163 mg/dL
eAG (mmol/L): 9 mmol/L

## 2023-08-10 LAB — COMPLETE METABOLIC PANEL WITH GFR
AG Ratio: 1.9 (calc) (ref 1.0–2.5)
ALT: 14 U/L (ref 9–46)
AST: 21 U/L (ref 10–35)
Albumin: 4.6 g/dL (ref 3.6–5.1)
Alkaline phosphatase (APISO): 53 U/L (ref 35–144)
BUN: 16 mg/dL (ref 7–25)
CO2: 27 mmol/L (ref 20–32)
Calcium: 10.3 mg/dL (ref 8.6–10.3)
Chloride: 97 mmol/L — ABNORMAL LOW (ref 98–110)
Creat: 1.04 mg/dL (ref 0.70–1.28)
Globulin: 2.4 g/dL (ref 1.9–3.7)
Glucose, Bld: 142 mg/dL — ABNORMAL HIGH (ref 65–99)
Potassium: 5.7 mmol/L — ABNORMAL HIGH (ref 3.5–5.3)
Sodium: 135 mmol/L (ref 135–146)
Total Bilirubin: 0.4 mg/dL (ref 0.2–1.2)
Total Protein: 7 g/dL (ref 6.1–8.1)
eGFR: 75 mL/min/{1.73_m2} (ref >=60)

## 2023-08-10 LAB — PSA: PSA: 0.41 ng/mL (ref <=4.00)

## 2023-08-15 ENCOUNTER — Telehealth: Payer: Self-pay

## 2023-08-15 NOTE — Telephone Encounter (Signed)
Copied from CRM [id number]. Topic: Clinical - Medication Question >> Aug 15, 2023  8:44 AM Dondra Prader A wrote: Reason for CRM: Patient is requesting to speak with Germaine Pomfret. When asked if their is anything in regards to speaking with Germaine Pomfret stated regarding medicine and that was it. Please advise.

## 2023-08-16 ENCOUNTER — Other Ambulatory Visit: Payer: Medicare Other

## 2023-08-16 DIAGNOSIS — E875 Hyperkalemia: Secondary | ICD-10-CM

## 2023-08-17 LAB — COMPREHENSIVE METABOLIC PANEL
AG Ratio: 2 (calc) (ref 1.0–2.5)
ALT: 17 U/L (ref 9–46)
AST: 21 U/L (ref 10–35)
Albumin: 4.5 g/dL (ref 3.6–5.1)
Alkaline phosphatase (APISO): 52 U/L (ref 35–144)
BUN: 16 mg/dL (ref 7–25)
CO2: 29 mmol/L (ref 20–32)
Calcium: 10 mg/dL (ref 8.6–10.3)
Chloride: 96 mmol/L — ABNORMAL LOW (ref 98–110)
Creat: 0.83 mg/dL (ref 0.70–1.28)
Globulin: 2.2 g/dL (ref 1.9–3.7)
Glucose, Bld: 180 mg/dL — ABNORMAL HIGH (ref 65–99)
Potassium: 5.1 mmol/L (ref 3.5–5.3)
Sodium: 134 mmol/L — ABNORMAL LOW (ref 135–146)
Total Bilirubin: 0.4 mg/dL (ref 0.2–1.2)
Total Protein: 6.7 g/dL (ref 6.1–8.1)

## 2023-08-25 ENCOUNTER — Ambulatory Visit (HOSPITAL_COMMUNITY)
Admission: RE | Admit: 2023-08-25 | Discharge: 2023-08-25 | Disposition: A | Discharge status: Home / Self Care | Payer: Self-pay | Source: Ambulatory Visit | Attending: Family Medicine | Admitting: Family Medicine

## 2023-08-25 DIAGNOSIS — E119 Type 2 diabetes mellitus without complications: Secondary | ICD-10-CM | POA: Insufficient documentation

## 2023-08-29 ENCOUNTER — Other Ambulatory Visit: Payer: Self-pay | Admitting: Family Medicine

## 2023-08-29 ENCOUNTER — Telehealth: Payer: Self-pay

## 2023-08-29 ENCOUNTER — Other Ambulatory Visit: Payer: Self-pay

## 2023-08-29 MED ORDER — EMPAGLIFLOZIN 25 MG PO TABS: 25.0000 mg | ORAL_TABLET | Freq: Every day | ORAL | 1 refills | Status: DC

## 2023-08-29 MED ORDER — EMPAGLIFLOZIN 25 MG PO TABS: 25.0000 mg | ORAL_TABLET | Freq: Every day | ORAL | 5 refills | Status: AC

## 2023-08-29 NOTE — Telephone Encounter (Signed)
Copied from CRM [id number]. Topic: Clinical - Medication Question >> Aug 29, 2023  2:46 PM Prudencio Pair wrote: Reason for CRM: Patient's wife, Dois Davenport, called to speak with Germaine Pomfret about pt's jardiance. She states he is out & was told by Germaine Pomfret to call her once he is out. Called [facility] & spoke with Nanette & she stated Germaine Pomfret is out of the office today. Advised pt's wife & she stated to just have Germaine Pomfret to give her a call back tomorrow or see if a nurse can call her today. Wants to know what does pt needs to do since he is out of his jardiance medication? CB #: [id number].

## 2023-08-30 ENCOUNTER — Other Ambulatory Visit: Payer: Self-pay | Admitting: Family Medicine

## 2023-08-30 ENCOUNTER — Telehealth: Payer: Self-pay

## 2023-08-30 ENCOUNTER — Telehealth: Payer: Self-pay | Admitting: Family Medicine

## 2023-08-30 MED ORDER — ATORVASTATIN CALCIUM 40 MG PO TABS: 40.0000 mg | ORAL_TABLET | Freq: Every day | ORAL | 3 refills | Status: DC

## 2023-08-30 NOTE — Telephone Encounter (Signed)
Copied from CRM [id number]. Topic: Clinical - Prescription Issue >> Aug 30, 2023  4:31 PM Martha Clan wrote: Reason for CRM: Patient would like to enroll in patient assistance for the empagliflozin (JARDIANCE) 25 MG TABS tablet [[id number]]

## 2023-08-30 NOTE — Telephone Encounter (Signed)
Received call from E2C2. Patient's wife called to request medication assistance for Jardiance; stated it would cost the patient about $500 out of pocket.   Email sent to [facility] Care Management with this information to see if the patient will qualify again this year.

## 2023-09-01 ENCOUNTER — Other Ambulatory Visit: Payer: Self-pay

## 2023-09-01 ENCOUNTER — Telehealth: Payer: Self-pay

## 2023-09-01 DIAGNOSIS — E119 Type 2 diabetes mellitus without complications: Secondary | ICD-10-CM

## 2023-09-01 NOTE — Telephone Encounter (Signed)
Copied from CRM [id number]. Topic: Clinical - Prescription Issue >> Aug 30, 2023  4:31 PM Martha Clan wrote: Reason for CRM: Patient would like to enroll in patient assistance for the empagliflozin (JARDIANCE) 25 MG TABS tablet [[id number]]

## 2023-09-05 ENCOUNTER — Telehealth: Payer: Self-pay

## 2023-09-05 NOTE — Progress Notes (Signed)
 Care Guide Pharmacy Note  09/05/2023 Name: Todd Lawrence MRN: [id number] DOB: 10-31-1950  Referred By: Donita Brooks, MD Reason for referral: Care Coordination (Outreach to schedule with Pharm d )   Todd Lawrence is a 73 y.o. year old male who is a primary care patient of Donita Brooks, MD.  Todd Lawrence was referred to the pharmacist for assistance related to: DMII  Successful contact was made with the patient to discuss pharmacy services including being ready for the pharmacist to call at least 5 minutes before the scheduled appointment time and to have medication bottles and any blood pressure readings ready for review. The patient agreed to meet with the pharmacist via telephone visit on (date/time).10/05/2023  Penne Lash , RMA     [place]  [facility], [facility] Guide  Direct Dial: [phone]  Website: [email or web address]

## 2023-09-23 ENCOUNTER — Other Ambulatory Visit: Payer: Self-pay | Admitting: Orthopaedic Surgery

## 2023-09-23 DIAGNOSIS — Z01818 Encounter for other preprocedural examination: Secondary | ICD-10-CM

## 2023-09-23 DIAGNOSIS — G8929 Other chronic pain: Secondary | ICD-10-CM

## 2023-09-29 ENCOUNTER — Ambulatory Visit
Admission: RE | Admit: 2023-09-29 | Discharge: 2023-09-29 | Disposition: A | Discharge status: Home / Self Care | Source: Ambulatory Visit | Attending: Orthopaedic Surgery

## 2023-09-29 DIAGNOSIS — G8929 Other chronic pain: Secondary | ICD-10-CM

## 2023-09-29 DIAGNOSIS — Z01818 Encounter for other preprocedural examination: Secondary | ICD-10-CM

## 2023-10-04 ENCOUNTER — Telehealth: Payer: Self-pay | Admitting: Family Medicine

## 2023-10-04 NOTE — Telephone Encounter (Signed)
 Copied from CRM [id number]. Topic: Clinical - Medication Question >> Oct 04, 2023  9:00 AM Geneva B wrote: Reason for CRM: patient wants to know if he can get more samples of  empagliflozin (JARDIANCE) 25 MG please call patient back [phone]

## 2023-10-05 ENCOUNTER — Other Ambulatory Visit: Payer: Self-pay | Admitting: Pharmacist

## 2023-10-05 ENCOUNTER — Encounter: Payer: Self-pay | Admitting: Pharmacist

## 2023-10-05 ENCOUNTER — Telehealth: Payer: Self-pay | Admitting: Pharmacist

## 2023-10-05 NOTE — Progress Notes (Unsigned)
 10/05/2023 Name: Todd Lawrence MRN: [id number] DOB: 02-19-1951  No chief complaint on file.   Todd Lawrence is a 73 y.o. year old male who presented for a telephone visit.   They were referred to the pharmacist by their PCP for assistance in managing diabetes and medication access.  Message to MJ Perdue for samples Will switch to farxiga 10mg  Upcoming     Subjective:  Care Team: Primary Care Provider: Donita Brooks, MD  {careteamprovider:27366}  Medication Access/Adherence  [facility]  [phone] [place], [place] - [address] AT [facility] [address] [place] [address] Phone: [phone] Fax: [phone]  [id number] [place], [place] - [address] AT [facility] [address] [place] [address] Phone: [phone] Fax: [phone]   Patient reports affordability concerns with their medications: {YES/NO:21197} Patient reports access/transportation concerns to their pharmacy: {YES/NO:21197} Patient reports adherence concerns with their medications:  {YES/NO:21197} ***   {Pharmacy S/O Choices:26420}   Objective:  Lab Results  Component Value Date   HGBA1C 7.3 (H) 08/09/2023    Lab Results  Component Value Date   CREATININE 0.83 08/16/2023   BUN 16 08/16/2023   NA 134 (L) 08/16/2023   K 5.1 08/16/2023   CL 96 (L) 08/16/2023   CO2 29 08/16/2023    Lab Results  Component Value Date   CHOL 176 08/09/2023   HDL 77 08/09/2023   LDLCALC 85 08/09/2023   TRIG 62 08/09/2023   CHOLHDL 2.3 08/09/2023    Medications Reviewed Today     Reviewed by Danella Maiers, [facility] (Pharmacist) on 10/05/23 at 1538  Med List Status: <None>   Medication Order Taking? Sig Documenting Provider Last Dose Status Informant  aspirin EC 81 MG tablet [id number] No Take 81 mg by mouth daily. Swallow whole. [provider] Taking Active Spouse/Significant Other  atorvastatin (LIPITOR) 40 MG  tablet [id number]  Take 1 tablet (40 mg total) by mouth daily. Donita Brooks, MD  Active   CREON [phone] units CPEP capsule [id number] No Take 36,000 Units by mouth 3 (three) times daily with meals. [provider] Taking Active Spouse/Significant Other           Med Note Todd Lawrence, Todd Lawrence   Tue Jul 14, 2021  1:37 PM) SAMPLE MED   empagliflozin (JARDIANCE) 25 MG TABS tablet [id number]  Take 1 tablet (25 mg total) by mouth daily before breakfast. Donita Brooks, MD  Active   hypromellose (SYSTANE OVERNIGHT THERAPY) 0.3 % GEL ophthalmic ointment [id number] No Place 1 application into both eyes at bedtime. [provider] Taking Active Spouse/Significant Other  ketotifen (ZADITOR) 0.025 % ophthalmic solution [id number] No Place 1 drop into both eyes 2 (two) times daily. [provider] Taking Active Spouse/Significant Other  metFORMIN (GLUCOPHAGE) 1000 MG tablet [id number] No TAKE 1 TABLET (1,000 MG TOTAL) BY MOUTH TWICE A DAY WITH FOOD Pickard, Priscille Heidelberg, MD Taking Active   Misc Natural Products (OSTEO BI-FLEX ADV JOINT SHIELD PO) [id number] No Take 1 tablet by mouth in the morning and at bedtime. [provider] Taking Active Spouse/Significant Other  Multiple Vitamin (MULTIVITAMIN WITH MINERALS) TABS tablet [id number] No Take 1 tablet by mouth daily. [provider] Taking Active Spouse/Significant Other  omeprazole (PRILOSEC) 40 MG capsule [id number] No TAKE 1 CAPSULE BY MOUTH EVERY DAY  Patient taking differently: Take 40 mg by mouth daily as needed (acid reflux).   Todd Lawrence  T, MD Taking Active Spouse/Significant Other  pioglitazone (ACTOS) 30 MG tablet [id number] No TAKE 1 TABLET BY MOUTH EVERY DAY Pickard, Priscille Heidelberg, MD Taking Active   Propylene Glycol (SYSTANE BALANCE) 0.6 % SOLN [id number] No Place 1 drop into both eyes in the morning and at bedtime. [provider] Taking Active Spouse/Significant Other  sitaGLIPtin (JANUVIA) 100 MG  tablet [id number] No Take 1 tablet (100 mg total) by mouth daily. Donita Brooks, MD Taking Active               Assessment/Plan:   {Pharmacy A/P Choices:26421}  Follow Up Plan: ***  ***

## 2023-10-05 NOTE — Telephone Encounter (Signed)
   Patient needs to enroll in the [place] patient assistance program for Farxiga.  Patient is stable on current regimen of Jardiance 25mg  daily (will need to transition patient to [place] 10mg  due to ease of patient assistance program).  Please mail patient portion and fax PCP portion.   Appreciate assistance  Kieth Brightly, PharmD, BCACP, CPP Clinical Pharmacist, [facility]

## 2023-10-28 ENCOUNTER — Encounter: Payer: Self-pay | Admitting: Family Medicine

## 2023-10-28 ENCOUNTER — Telehealth: Payer: Self-pay

## 2023-10-28 NOTE — Telephone Encounter (Signed)
 LM on identified voice mail advising pt that letter is ready. Mjp,lpn

## 2023-10-31 ENCOUNTER — Telehealth: Payer: Self-pay

## 2023-10-31 NOTE — Telephone Encounter (Signed)
 Copied from CRM [id number]. Topic: Clinical - Medication Question >> Oct 31, 2023  9:48 AM Carlatta H wrote: Reason for CRM: Delilah Fend, [facility] please call the patient regarding medication assistance//

## 2023-11-03 ENCOUNTER — Telehealth: Payer: Self-pay

## 2023-11-03 NOTE — Telephone Encounter (Signed)
 Error changed context to correct encounter

## 2023-11-03 NOTE — Telephone Encounter (Signed)
 PAP: Patient assistance application for Farxiga through [employer] (AZ&Me) has been mailed to pt's home address on file. Provider portion of application will be faxed to provider's office.

## 2023-11-11 NOTE — Telephone Encounter (Signed)
 Received provider portion of PAP application AZ&ME- waiting on PT- will follow up

## 2023-11-14 NOTE — Telephone Encounter (Signed)
 Re-mailed PAP application for Farxiga ([place]&Me) to patient's home address.

## 2023-11-17 ENCOUNTER — Telehealth: Payer: Self-pay

## 2023-11-17 NOTE — Telephone Encounter (Signed)
 Copied from CRM [id number]. Topic: General - Billing Inquiry >> Nov 17, 2023  9:03 AM Todd Lawrence wrote: Reason for CRM: Patient is calling to see if he can get more samples of empagliflozin  (JARDIANCE ) 25 MG TABS tablet he says that his wife spoke to someone who is suppose to get back to them, they are going to check on it. He is stating that the price is to high and he has taken his last 2. He he already filled the paper work out.

## 2023-11-18 NOTE — Telephone Encounter (Signed)
 PAP: Application for Todd Lawrence has been submitted to [employer] (AZ&Me), via fax

## 2023-11-22 NOTE — Telephone Encounter (Signed)
 PAP: Patient assistance application for Farxiga has been approved by PAP Companies: [place]&ME from 11/18/2023 to 07/11/2024. Medication should be delivered to PAP Delivery: Home. For further shipping updates, please contact [employer] (AZ&Me) at [phone]. Patient ID is: PEP_ [id number]

## 2023-11-22 NOTE — Progress Notes (Signed)
 Pharmacy Medication Assistance Program Note    11/22/2023  Patient ID: Todd Lawrence, male   DOB: 08/16/1950, 73 y.o.   MRN: [id number]     11/22/2023  Outreach Medication One  Initial Outreach Date (Medication One) 11/03/2023  Manufacturer Medication One [employer]  [employer] [facility] Farxiga  Dose of Farxiga 10 MG  Type of Assistance Manufacturer Assistance  Date Application Sent to Patient 11/03/2023  Application Items Requested Application  Date Application Sent to Prescriber 11/10/2023  Name of Prescriber Eliane Grooms  Date Application Received From Patient 11/18/2023  Application Items Received From Patient Application  Date Application Received From Provider 11/14/2023  Date Application Submitted to Manufacturer 11/18/2023  Method Application Sent to Manufacturer Fax  Patient Assistance Determination Approved  Approval Start Date 11/18/2023  Approval End Date 07/11/2024  Patient Notification Method MyChart   Processed on 11/19/2023- allow 10-14 business days for shipment to arrive

## 2023-12-06 ENCOUNTER — Telehealth: Payer: Self-pay

## 2023-12-06 NOTE — Telephone Encounter (Signed)
 Copied from CRM #[id number]. Topic: Clinical - Medication Question >> Dec 06, 2023 10:14 AM Magdalene School wrote: Reason for CRM: Adell Hones, the patient's wife, called to report that the patient received Farxiga 10 mg tablets in the mail as a replacement for Jardiance  (empagliflozin ) 25 mg due to cost concerns.  Before starting the new medication, Adell Hones is requesting a call back to verify the correct dosage and ensure the substitution is appropriate.

## 2024-01-02 ENCOUNTER — Ambulatory Visit: Payer: Self-pay

## 2024-01-02 NOTE — Telephone Encounter (Signed)
 Copied from CRM [id number]. Topic: Clinical - Red Word Triage >> Jan 02, 2024  4:09 PM Elle L wrote: Red Word that prompted transfer to Nurse Triage: The patient states he has been experiencing lower back pain for the last 5 days.   Reason for Disposition  [1] MODERATE back pain (e.g., interferes with normal activities) AND [2] present > 3 days  Answer Assessment - Initial Assessment Questions 1. ONSET: When did the pain begin?      5 days ago 2. LOCATION: Where does it hurt? (upper, mid or lower back)     Left lower back  3. SEVERITY: How bad is the pain?  (e.g., Scale 1-10; mild, moderate, or severe)   - MILD (1-3): Doesn't interfere with normal activities.    - MODERATE (4-7): Interferes with normal activities or awakens from sleep.    - SEVERE (8-10): Excruciating pain, unable to do any normal activities.      Mild  4. PATTERN: Is the pain constant? (e.g., yes, no; constant, intermittent)      Constant  5. RADIATION: Does the pain shoot into your legs or somewhere else?     No 6. CAUSE:  What do you think is causing the back pain?      Possibly due to overuse  7. BACK OVERUSE:  Any recent lifting of heavy objects, strenuous work or exercise?     Yes 8. MEDICINES: What have you taken so far for the pain? (e.g., nothing, acetaminophen , NSAIDS)     Ibuprofen  9. NEUROLOGIC SYMPTOMS: Do you have any weakness, numbness, or problems with bowel/bladder control?     No 10. OTHER SYMPTOMS: Do you have any other symptoms? (e.g., fever, abdomen pain, burning with urination, blood in urine)       No  Protocols used: Back Pain-A-AH   FYI Only or Action Required?: FYI only for provider.  Patient was last seen in primary care on 08/09/2023 by Duanne Butler DASEN, MD. Called Nurse Triage reporting Back Pain. Symptoms began several days ago. Interventions attempted: OTC medications: Ibuprofen. Symptoms are: unchanged.  Triage Disposition: See PCP When Office is Open (Within 3  Days)  Patient/caregiver understands and will follow disposition?: Yes

## 2024-01-03 ENCOUNTER — Encounter: Payer: Self-pay | Admitting: Family Medicine

## 2024-01-03 ENCOUNTER — Ambulatory Visit (INDEPENDENT_AMBULATORY_CARE_PROVIDER_SITE_OTHER): Admitting: Family Medicine

## 2024-01-03 VITALS — BP 116/82 | HR 76 | Temp 97.2°F | Ht 70.0 in | Wt 167.6 lb

## 2024-01-03 DIAGNOSIS — M545 Low back pain, unspecified: Secondary | ICD-10-CM

## 2024-01-03 MED ORDER — MELOXICAM 15 MG PO TABS: 15.0000 mg | ORAL_TABLET | Freq: Every day | ORAL | 0 refills | Status: DC

## 2024-01-03 MED ORDER — PIOGLITAZONE HCL 30 MG PO TABS: 30.0000 mg | ORAL_TABLET | Freq: Every day | ORAL | 3 refills | Status: AC

## 2024-01-03 MED ORDER — OMEPRAZOLE 40 MG PO CPDR: 40.0000 mg | DELAYED_RELEASE_CAPSULE | Freq: Every day | ORAL | 11 refills | Status: AC

## 2024-01-03 NOTE — Progress Notes (Signed)
 Subjective:    Patient ID: Todd Lawrence, male    DOB: 1950/11/20, 73 y.o.   MRN: [id number]  HPI Over the last 2 weeks, the patient complains of pain in his lower back roughly at the level of L4-L5.  The pain is located to the left side of his spinous processes at that level.  He denies any numbness or tingling in his legs.  He denies any weakness in his legs.  He denies any shooting radicular pain.  He has a negative straight leg raise today bilaterally.  Muscle strength is normal in both legs.  Nontender to palpation along the spine.   Past Medical History:  Diagnosis Date   Acute meniscal tear of left knee    DDD (degenerative disc disease), lumbar    has received ESI x 3   Diabetes mellitus without complication (HCC)    GERD (gastroesophageal reflux disease)    History of blood transfusion    History of staph infection    after thumb surgery   Hyperlipidemia    Left rotator cuff tear    Primary localized osteoarthritis of left knee    Past Surgical History:  Procedure Laterality Date   CERVICAL FUSION     COLONOSCOPY     ESOPHAGOGASTRODUODENOSCOPY (EGD) WITH PROPOFOL  N/A 07/24/2021   Procedure: ESOPHAGOGASTRODUODENOSCOPY (EGD) WITH PROPOFOL ;  Surgeon: Rollin Dover, MD;  Location: [facility];  Service: Endoscopy;  Laterality: N/A;   FOOT SURGERY Right    JOINT REPLACEMENT N/A    Phreesia 12/02/2019   KNEE ARTHROSCOPY Bilateral    LAMINOTOMY     C7-T1 (2023) right C8 radiculopathy Samule Pool)   SHOULDER ARTHROSCOPY Left    SPINE SURGERY  11/23/13   neck fusion   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 Swallow whole.     atorvastatin  (LIPITOR) 40 MG tablet Take 1 tablet (40 mg total) by mouth daily. 90 tablet 3   CREON 36000-114000 units CPEP capsule Take 36,000 Units by mouth 3 (three) times daily with meals.     dapagliflozin propanediol (FARXIGA) 10 MG TABS tablet Take 10 mg by mouth daily.     hypromellose (SYSTANE OVERNIGHT THERAPY) 0.3 % GEL ophthalmic ointment Place 1 application into both eyes at bedtime.     ketotifen (ZADITOR) 0.025 % ophthalmic solution Place 1 drop into both eyes 2 (two) times daily.     metFORMIN  (GLUCOPHAGE ) 1000 MG tablet TAKE 1 TABLET (1,000 MG TOTAL) BY MOUTH TWICE A DAY WITH FOOD 180 tablet 3   Misc Natural Products (OSTEO BI-FLEX ADV JOINT SHIELD PO) Take 1 tablet by mouth in the morning and at bedtime.     Multiple Vitamin (MULTIVITAMIN WITH MINERALS) TABS tablet Take 1 tablet by mouth daily.     Propylene Glycol (SYSTANE BALANCE) 0.6 % SOLN Place 1 drop into both eyes in the morning and at bedtime.     empagliflozin  (JARDIANCE ) 25 MG TABS tablet Take 1  tablet (25 mg total) by mouth daily before breakfast. (Patient not taking: Reported on 01/03/2024) 30 tablet 5   No current facility-administered medications on file prior to visit.   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  Other Topics Concern   Not on  file  Social History Narrative   Not on file   Social Drivers of Health   Financial Resource Strain: Low Risk  (12/12/2020)   Overall Financial Resource Strain (CARDIA)    Difficulty of Paying Living Expenses: Not hard at all  Food Insecurity: No Food Insecurity (12/12/2020)   Hunger Vital Sign    Worried About Running Out of Food in the Last Year: Never true    Ran Out of Food in the Last Year: Never true  Transportation Needs: No Transportation Needs (12/12/2020)   PRAPARE - Administrator, Civil Service (Medical): No    Lack of Transportation (Non-Medical): No  Physical Activity: Inactive (12/12/2020)   Exercise Vital Sign    Days of Exercise per Week: 0 days    Minutes of Exercise per Session: 0 min  Stress: No Stress Concern Present (12/12/2020)   [employer] of Occupational Health - Occupational Stress Questionnaire    Feeling of Stress : Not at all  Social Connections: Moderately Integrated (12/12/2020)   Social Connection and Isolation Panel    Frequency of Communication with Friends and Family: Three times a week    Frequency of Social Gatherings with Friends and Family: More than three times a week    Attends Religious Services: More than 4 times per year    Active Member of [employer] or Organizations: No    Attends Banker Meetings: Never    Marital Status: Married  Catering manager Violence: Not At Risk (12/12/2020)   Humiliation, Afraid, Rape, and Kick questionnaire    Fear of Current or Ex-Partner: No    Emotionally Abused: No    Physically Abused: No    Sexually Abused: No   Family History  Problem Relation Age of Onset   Heart disease Father    Asthma Sister    Arthritis Sister      Review of Systems     Objective:   Physical Exam Vitals reviewed.  Constitutional:      General: He is not in acute distress.    Appearance: Normal appearance. He is normal weight.  Neck:     Vascular: No carotid bruit.   Cardiovascular:     Rate and  Rhythm: Normal rate and regular rhythm.     Pulses: Normal pulses.     Heart sounds: Normal heart sounds. No murmur heard. Pulmonary:     Effort: Pulmonary effort is normal. No respiratory distress.     Breath sounds: Normal breath sounds. No wheezing, rhonchi or rales.  Abdominal:     General: Bowel sounds are normal.   Musculoskeletal:  Cervical back: Neck supple.     Lumbar back: Tenderness present. No edema, signs of trauma, spasms or bony tenderness. Decreased range of motion. Negative right straight leg raise test and negative left straight leg raise test. No scoliosis.       Back:  Lymphadenopathy:     Cervical: No cervical adenopathy.   Skin:    Coloration: Skin is not jaundiced.     Findings: No bruising, lesion or rash.   Neurological:     General: No focal deficit present.     Mental Status: He is alert and oriented to person, place, and time.     Cranial Nerves: No cranial nerve deficit.     Motor: No weakness.     Gait: Gait normal.           Assessment & Plan:  Acute midline low back pain without sciatica Patient has known degenerative disc disease in his lumbar spine.  However I believe more likely a muscle pull.  I have recommended trying meloxicam 15 mg a day for 1 to 2 weeks to see if the pain will improve.  If not, we could proceed with further imaging of the back.

## 2024-01-09 ENCOUNTER — Ambulatory Visit: Admitting: Family Medicine

## 2024-02-06 ENCOUNTER — Ambulatory Visit: Payer: Medicare Other | Admitting: Family Medicine

## 2024-02-06 ENCOUNTER — Encounter: Payer: Self-pay | Admitting: Family Medicine

## 2024-02-06 VITALS — BP 120/70 | HR 78 | Temp 97.8°F | Ht 70.0 in | Wt 167.0 lb

## 2024-02-06 DIAGNOSIS — K5904 Chronic idiopathic constipation: Secondary | ICD-10-CM | POA: Insufficient documentation

## 2024-02-06 DIAGNOSIS — Z1211 Encounter for screening for malignant neoplasm of colon: Secondary | ICD-10-CM | POA: Insufficient documentation

## 2024-02-06 DIAGNOSIS — Z7984 Long term (current) use of oral hypoglycemic drugs: Secondary | ICD-10-CM

## 2024-02-06 DIAGNOSIS — E118 Type 2 diabetes mellitus with unspecified complications: Secondary | ICD-10-CM | POA: Insufficient documentation

## 2024-02-06 DIAGNOSIS — E119 Type 2 diabetes mellitus without complications: Secondary | ICD-10-CM | POA: Diagnosis not present

## 2024-02-06 DIAGNOSIS — M545 Low back pain, unspecified: Secondary | ICD-10-CM | POA: Diagnosis not present

## 2024-02-06 DIAGNOSIS — R140 Abdominal distension (gaseous): Secondary | ICD-10-CM | POA: Insufficient documentation

## 2024-02-06 MED ORDER — MELOXICAM 15 MG PO TABS: 15.0000 mg | ORAL_TABLET | Freq: Every day | ORAL | 5 refills | Status: AC

## 2024-02-06 NOTE — Progress Notes (Signed)
 Subjective:    Patient ID: Todd Lawrence, male    DOB: 1951-04-11, 73 y.o.   MRN: [id number]  Patient to today for follow-up of his diabetes.  His blood pressure today is outstanding.  He denies any chest pain shortness of breath or dyspnea on exertion.  He denies any numbness or tingling in his feet.  He denies any symptoms of claudication.  Immunization History  Administered Date(s) Administered   Fluad Quad(high Dose 65+) 06/18/2022   Influenza, High Dose Seasonal PF 06/12/2021, 05/11/2023   Influenza,inj,Quad PF,6+ Mos 04/04/2018   PFIZER(Purple Top)SARS-COV-2 Vaccination 08/06/2019, 08/27/2019   Pneumococcal Conjugate Pcv21, Polysaccharide Crm197 Conjugaf 08/09/2023   Pneumococcal Conjugate-13 11/16/2016   Pneumococcal Polysaccharide-23 01/31/2015   Tdap 09/25/2013, 08/09/2015   Zoster Recombinant(Shingrix ) 05/30/2019, 11/08/2022   Zoster, Live 11/06/2013   Past Medical History:  Diagnosis Date   Acute meniscal tear of left knee    DDD (degenerative disc disease), lumbar    has received ESI x 3   Diabetes mellitus without complication (HCC)    GERD (gastroesophageal reflux disease)    History of blood transfusion    History of staph infection    after thumb surgery   Hyperlipidemia    Left rotator cuff tear    Primary localized osteoarthritis of left knee    Past Surgical History:  Procedure Laterality Date   CERVICAL FUSION     COLONOSCOPY     ESOPHAGOGASTRODUODENOSCOPY (EGD) WITH PROPOFOL  N/A 07/24/2021   Procedure: ESOPHAGOGASTRODUODENOSCOPY (EGD) WITH PROPOFOL ;  Surgeon: Rollin Dover, MD;  Location: [facility];  Service: Endoscopy;  Laterality: N/A;   FOOT SURGERY Right    JOINT REPLACEMENT N/A    Phreesia 12/02/2019   KNEE ARTHROSCOPY Bilateral    LAMINOTOMY     C7-T1 (2023) right C8 radiculopathy Samule Pool)   SHOULDER ARTHROSCOPY Left    SPINE SURGERY  11/23/13   neck fusion   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 Swallow whole.     atorvastatin  (LIPITOR) 40 MG tablet Take 1 tablet (40 mg total) by mouth daily. 90 tablet 3   CREON 36000-114000 units CPEP capsule Take 36,000 Units by mouth 3 (three) times daily with meals.     dapagliflozin propanediol (FARXIGA) 10 MG TABS tablet Take 10 mg by mouth daily.     hypromellose (SYSTANE OVERNIGHT THERAPY) 0.3 % GEL ophthalmic ointment Place 1 application into both eyes at bedtime.     ketotifen (ZADITOR) 0.025 % ophthalmic solution Place 1 drop into both eyes 2 (two) times daily.     metFORMIN  (GLUCOPHAGE ) 1000 MG tablet TAKE 1 TABLET (1,000 MG TOTAL) BY MOUTH TWICE A DAY WITH FOOD 180 tablet 3   Misc Natural Products (OSTEO BI-FLEX ADV JOINT SHIELD PO) Take 1 tablet by mouth in the morning and at bedtime.     Multiple Vitamin (MULTIVITAMIN WITH MINERALS) TABS tablet Take 1 tablet by mouth daily.     omeprazole  (PRILOSEC)  40 MG capsule Take 1 capsule (40 mg total) by mouth daily. 30 capsule 11   pioglitazone  (ACTOS ) 30 MG tablet Take 1 tablet (30 mg total) by mouth daily. 90 tablet 3   Propylene Glycol (SYSTANE BALANCE) 0.6 % SOLN Place 1 drop into both eyes in the morning and at bedtime.     empagliflozin  (JARDIANCE ) 25 MG TABS tablet Take 1 tablet (25 mg total) by mouth daily before breakfast. (Patient not taking: Reported on 02/06/2024) 30 tablet 5   No current facility-administered medications on file prior to visit.   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  Other Topics Concern   Not on file  Social History Narrative   Not on file   Social Drivers of Health   Financial Resource Strain: Low Risk  (12/12/2020)   Overall Financial Resource Strain (CARDIA)    Difficulty of Paying Living Expenses: Not hard at all  Food Insecurity: No Food Insecurity (12/12/2020)   Hunger Vital Sign    Worried About Running Out of Food in the Last Year: Never true    Ran Out of Food in the Last Year: Never true  Transportation Needs: No Transportation Needs (12/12/2020)   PRAPARE - Administrator, Civil Service (Medical): No    Lack of Transportation (Non-Medical): No  Physical Activity: Inactive (12/12/2020)   Exercise Vital Sign    Days of Exercise per Week: 0 days    Minutes of Exercise per Session: 0 min  Stress: No Stress Concern Present (12/12/2020)   [employer] of Occupational Health - Occupational Stress Questionnaire    Feeling of Stress : Not at all  Social Connections: Moderately Integrated (12/12/2020)   Social Connection and Isolation Panel    Frequency of Communication with Friends and Family: Three times a week    Frequency of Social Gatherings with Friends and Family: More than three times a week    Attends Religious Services: More than 4 times per year    Active Member of [employer] or Organizations: No    Attends Banker Meetings: Never    Marital Status: Married  Catering manager Violence: Not At Risk (12/12/2020)   Humiliation,  Afraid, Rape, and Kick questionnaire    Fear of Current or Ex-Partner: No    Emotionally Abused: No    Physically Abused: No    Sexually Abused: No   Family History  Problem Relation Age of Onset   Heart disease Father    Asthma Sister    Arthritis Sister      Review of Systems     Objective:   Physical Exam Vitals reviewed.  Constitutional:      General: He is not in acute distress.    Appearance: Normal appearance. He is normal weight.  Neck:     Vascular: No carotid bruit.  Cardiovascular:     Rate and Rhythm: Normal rate  and regular rhythm.     Pulses: Normal pulses.     Heart sounds: Normal heart sounds. No murmur heard. Pulmonary:     Effort: Pulmonary effort is normal. No respiratory distress.     Breath sounds: Normal breath sounds. No wheezing, rhonchi or rales.  Abdominal:     General: Abdomen is flat. Bowel sounds are normal. There is no distension.     Palpations: Abdomen is soft.     Tenderness: There is no abdominal tenderness. There is no guarding.  Musculoskeletal:     Cervical back: Neck supple.  Lymphadenopathy:     Cervical: No cervical adenopathy.  Skin:    Coloration: Skin is not jaundiced.     Findings: No bruising, lesion or rash.  Neurological:     General: No focal deficit present.     Mental Status: He is alert and oriented to person, place, and time.     Cranial Nerves: No cranial nerve deficit.     Motor: No weakness.     Gait: Gait normal.           Assessment & Plan:  Acute midline low back pain without sciatica - Plan: meloxicam  (MOBIC ) 15 MG tablet  Type 2 diabetes mellitus without complication, without long-term current use of insulin  (HCC) - Plan: CBC with Differential/Platelet, Comprehensive metabolic panel with GFR, Lipid panel, Hemoglobin A1c We obtained a coronary artery calcium  score at his last visit.  He scored 58 percentile.  I would like to keep his LDL cholesterol less than 70 if possible.  Blood pressure today is  excellent.  Check hemoglobin A1c.  Goal hemoglobin A1c is less than 6.5.

## 2024-02-07 ENCOUNTER — Ambulatory Visit: Payer: Self-pay | Admitting: Family Medicine

## 2024-02-07 ENCOUNTER — Ambulatory Visit

## 2024-02-07 LAB — COMPREHENSIVE METABOLIC PANEL WITH GFR
AG Ratio: 1.8 (calc) (ref 1.0–2.5)
ALT: 12 U/L (ref 9–46)
AST: 22 U/L (ref 10–35)
Albumin: 4.3 g/dL (ref 3.6–5.1)
Alkaline phosphatase (APISO): 47 U/L (ref 35–144)
BUN: 15 mg/dL (ref 7–25)
CO2: 28 mmol/L (ref 20–32)
Calcium: 9.7 mg/dL (ref 8.6–10.3)
Chloride: 98 mmol/L (ref 98–110)
Creat: 0.82 mg/dL (ref 0.70–1.28)
Globulin: 2.4 g/dL (ref 1.9–3.7)
Glucose, Bld: 146 mg/dL — ABNORMAL HIGH (ref 65–99)
Potassium: 4.9 mmol/L (ref 3.5–5.3)
Sodium: 135 mmol/L (ref 135–146)
Total Bilirubin: 0.4 mg/dL (ref 0.2–1.2)
Total Protein: 6.7 g/dL (ref 6.1–8.1)
eGFR: 92 mL/min/1.73m2 (ref >=60)

## 2024-02-07 LAB — CBC WITH DIFFERENTIAL/PLATELET
Absolute Lymphocytes: 1275 {cells}/uL (ref 850–3900)
Absolute Monocytes: 520 {cells}/uL (ref 200–950)
Basophils Absolute: 61 {cells}/uL (ref 0–200)
Basophils Relative: 1.2 %
Eosinophils Absolute: 281 {cells}/uL (ref 15–500)
Eosinophils Relative: 5.5 %
HCT: 41.2 % (ref 38.5–50.0)
Hemoglobin: 13.3 g/dL (ref 13.2–17.1)
MCH: 30.3 pg (ref 27.0–33.0)
MCHC: 32.3 g/dL (ref 32.0–36.0)
MCV: 93.8 fL (ref 80.0–100.0)
MPV: 10.5 fL (ref 7.5–12.5)
Monocytes Relative: 10.2 %
Neutro Abs: 2963 {cells}/uL (ref 1500–7800)
Neutrophils Relative %: 58.1 %
Platelets: 269 Thousand/uL (ref 140–400)
RBC: 4.39 Million/uL (ref 4.20–5.80)
RDW: 13.8 % (ref 11.0–15.0)
Total Lymphocyte: 25 %
WBC: 5.1 Thousand/uL (ref 3.8–10.8)

## 2024-02-07 LAB — HEMOGLOBIN A1C
Hgb A1c MFr Bld: 7.3 % — ABNORMAL HIGH (ref <5.7)
Mean Plasma Glucose: 163 mg/dL
eAG (mmol/L): 9 mmol/L

## 2024-02-07 LAB — LIPID PANEL
Cholesterol: 159 mg/dL (ref <200)
HDL: 66 mg/dL (ref >=40)
LDL Cholesterol (Calc): 79 mg/dL
Non-HDL Cholesterol (Calc): 93 mg/dL (ref <130)
Total CHOL/HDL Ratio: 2.4 (calc) (ref <5.0)
Triglycerides: 63 mg/dL (ref <150)

## 2024-02-08 ENCOUNTER — Ambulatory Visit (INDEPENDENT_AMBULATORY_CARE_PROVIDER_SITE_OTHER)

## 2024-02-08 DIAGNOSIS — E119 Type 2 diabetes mellitus without complications: Secondary | ICD-10-CM

## 2024-02-08 NOTE — Progress Notes (Signed)
 Todd Lawrence arrived 02/08/2024 and has given verbal consent to obtain images and complete their overdue diabetic retinal screening.  The images have been sent to an ophthalmologist or optometrist for review and interpretation.  Results will be sent back to Todd Butler DASEN, MD for review.  Patient has been informed they will be contacted when we receive the results via telephone or MyChart

## 2024-02-09 ENCOUNTER — Telehealth: Payer: Self-pay

## 2024-02-09 ENCOUNTER — Other Ambulatory Visit: Payer: Self-pay

## 2024-02-09 DIAGNOSIS — E119 Type 2 diabetes mellitus without complications: Secondary | ICD-10-CM

## 2024-02-09 NOTE — Telephone Encounter (Signed)
 Pt would like referral to Ophthalmologist for eye exam. Pt would like to know if you recommend [facility]? Thanks.

## 2024-02-14 NOTE — Progress Notes (Signed)
 Images were received from diabetic eye exam. The images were unreadable. It is recommended that pt get a referral to an ophthalmologist for further imaging. The patient will be contacted and referred if agreeable.  Elida GORMAN [place], CMA

## 2024-02-21 ENCOUNTER — Other Ambulatory Visit: Payer: Self-pay

## 2024-02-21 NOTE — Telephone Encounter (Signed)
 Prescription Request  02/21/2024  LOV: 02/06/24  What is the name of the medication or equipment? metFORMIN  (GLUCOPHAGE ) 1000 MG tablet [[id number]]   Have you contacted your pharmacy to request a refill? Yes   Which pharmacy would you like this sent to?  [id number] [address] [place], [place] - [address] [place] [address] Phone: [phone] Fax: [phone]    Patient notified that their request is being sent to the clinical staff for review and that they should receive a response within 2 business days.   Please advise at [facility] [phone]

## 2024-02-23 ENCOUNTER — Telehealth: Payer: Self-pay

## 2024-02-23 MED ORDER — METFORMIN HCL 1000 MG PO TABS: 1000.0000 mg | ORAL_TABLET | Freq: Two times a day (BID) | ORAL | 0 refills | Status: DC

## 2024-02-23 NOTE — Telephone Encounter (Signed)
 Copied from CRM #[id number]. Topic: Clinical - Lab/Test Results >> Feb 23, 2024  9:02 AM Tonda B wrote: Reason for CRM: patients wife is calling to go over results please call pt back [phone]

## 2024-02-23 NOTE — Telephone Encounter (Signed)
 Requested Prescriptions  Pending Prescriptions Disp Refills   metFORMIN  (GLUCOPHAGE ) 1000 MG tablet 180 tablet 0    Sig: Take 1 tablet (1,000 mg total) by mouth 2 (two) times daily with a meal.     Endocrinology:  Diabetes - Biguanides Passed - 02/23/2024  4:06 PM      Passed - Cr in normal range and within 360 days    Creat  Date Value Ref Range Status  02/06/2024 0.82 0.70 - 1.28 mg/dL Final   Creatinine, Urine  Date Value Ref Range Status  11/03/2022 97 20 - 320 mg/dL Final         Passed - HBA1C is between 0 and 7.9 and within 180 days    Hgb A1c MFr Bld  Date Value Ref Range Status  02/06/2024 7.3 (H) <5.7 % Final    Comment:    For someone without known diabetes, a hemoglobin A1c value of 6.5% or greater indicates that they may have  diabetes and this should be confirmed with a follow-up  test. . For someone with known diabetes, a value <7% indicates  that their diabetes is well controlled and a value  greater than or equal to 7% indicates suboptimal  control. A1c targets should be individualized based on  duration of diabetes, age, comorbid conditions, and  other considerations. . Currently, no consensus exists regarding use of hemoglobin A1c for diagnosis of diabetes for children. .          Passed - eGFR in normal range and within 360 days    GFR, Est African American  Date Value Ref Range Status  01/02/2021 101 > OR = 60 mL/min/1.67m2 Final   GFR, Est Non African American  Date Value Ref Range Status  01/02/2021 87 > OR = 60 mL/min/1.70m2 Final   eGFR  Date Value Ref Range Status  02/06/2024 92 > OR = 60 mL/min/1.34m2 Final         Passed - B12 Level in normal range and within 720 days    Vitamin B-12  Date Value Ref Range Status  11/03/2022 342 200 - 1,100 pg/mL Final    Comment:    . Please Note: Although the reference range for vitamin B12 is [phone] pg/mL, it has been reported that between 5 and 10% of patients with values between 200 and  400 pg/mL may experience neuropsychiatric and hematologic abnormalities due to occult B12 deficiency; less than 1% of patients with values above 400 pg/mL will have symptoms. SABRA Amy - Valid encounter within last 6 months    Recent Outpatient Visits           2 weeks ago Acute midline low back pain without sciatica   [facility] Duanne Butler DASEN, MD   1 month ago Acute midline low back pain without sciatica   [place] [facility] Duanne Butler DASEN, MD   6 months ago Type 2 diabetes mellitus without complication, without long-term current use of insulin  [facility])   [facility] Duanne Butler DASEN, MD   1 year ago Fatigue, unspecified type   [facility] Duanne Butler DASEN, MD   1 year ago Cervical radiculopathy   [facility] Duanne Butler DASEN, MD              Passed - CBC within normal limits and completed in the last  12 months    WBC  Date Value Ref Range Status  02/06/2024 5.1 3.8 - 10.8 Thousand/uL Final   RBC  Date Value Ref Range Status  02/06/2024 4.39 4.20 - 5.80 Million/uL Final   Hemoglobin  Date Value Ref Range Status  02/06/2024 13.3 13.2 - 17.1 g/dL Final   HCT  Date Value Ref Range Status  02/06/2024 41.2 38.5 - 50.0 % Final   MCHC  Date Value Ref Range Status  02/06/2024 32.3 32.0 - 36.0 g/dL Final    Comment:    For adults, a slight decrease in the calculated MCHC value (in the range of 30 to 32 g/dL) is most likely not clinically significant; however, it should be interpreted with caution in correlation with other red cell parameters and the patient's clinical condition.    [facility]  Date Value Ref Range Status  02/06/2024 30.3 27.0 - 33.0 pg Final   MCV  Date Value Ref Range Status  02/06/2024 93.8 80.0 - 100.0 fL Final   No results found for: PLTCOUNTKUC, LABPLAT, POCPLA RDW  Date Value Ref Range  Status  02/06/2024 13.8 11.0 - 15.0 % Final

## 2024-02-29 LAB — HM DIABETES EYE EXAM

## 2024-04-23 ENCOUNTER — Telehealth: Payer: Self-pay

## 2024-04-23 NOTE — Telephone Encounter (Signed)
 PAP: Patient assistance application for Farxiga through [employer] (AZ&Me) has been mailed to pt's home address on file. Provider portion of application will be faxed to provider's office.

## 2024-04-25 NOTE — Telephone Encounter (Signed)
 PAP: Application for Marcelline Deist has been submitted to [employer] ([employer]), via fax

## 2024-05-22 ENCOUNTER — Other Ambulatory Visit: Payer: Self-pay | Admitting: Family Medicine

## 2024-05-22 NOTE — Telephone Encounter (Signed)
 PAP: Patient assistance application for Farxiga has been approved by PAP Companies: [employer] from 07/12/2024 to 07/11/2025. Medication should be delivered to PAP Delivery: Home. For further shipping updates, please contact [employer] ([employer]) at [phone]. Patient ID is: [id number]

## 2024-05-24 NOTE — Telephone Encounter (Signed)
 Requested Prescriptions  Pending Prescriptions Disp Refills   metFORMIN  (GLUCOPHAGE ) 1000 MG tablet [Pharmacy Med Name: METFORMIN  HCL 1,000 MG TABLET] 180 tablet 0    Sig: TAKE 1 TABLET (1,000 MG TOTAL) BY MOUTH TWICE A DAY WITH FOOD     Endocrinology:  Diabetes - Biguanides Passed - 05/24/2024  9:56 AM      Passed - Cr in normal range and within 360 days    Creat  Date Value Ref Range Status  02/06/2024 0.82 0.70 - 1.28 mg/dL Final   Creatinine, Urine  Date Value Ref Range Status  11/03/2022 97 20 - 320 mg/dL Final         Passed - HBA1C is between 0 and 7.9 and within 180 days    Hgb A1c MFr Bld  Date Value Ref Range Status  02/06/2024 7.3 (H) <5.7 % Final    Comment:    For someone without known diabetes, a hemoglobin A1c value of 6.5% or greater indicates that they may have  diabetes and this should be confirmed with a follow-up  test. . For someone with known diabetes, a value <7% indicates  that their diabetes is well controlled and a value  greater than or equal to 7% indicates suboptimal  control. A1c targets should be individualized based on  duration of diabetes, age, comorbid conditions, and  other considerations. . Currently, no consensus exists regarding use of hemoglobin A1c for diagnosis of diabetes for children. .          Passed - eGFR in normal range and within 360 days    GFR, Est African American  Date Value Ref Range Status  01/02/2021 101 > OR = 60 mL/min/1.31m2 Final   GFR, Est Non African American  Date Value Ref Range Status  01/02/2021 87 > OR = 60 mL/min/1.52m2 Final   eGFR  Date Value Ref Range Status  02/06/2024 92 > OR = 60 mL/min/1.56m2 Final         Passed - B12 Level in normal range and within 720 days    Vitamin B-12  Date Value Ref Range Status  11/03/2022 342 200 - 1,100 pg/mL Final    Comment:    . Please Note: Although the reference range for vitamin B12 is [phone] pg/mL, it has been reported that between 5 and 10%  of patients with values between 200 and 400 pg/mL may experience neuropsychiatric and hematologic abnormalities due to occult B12 deficiency; less than 1% of patients with values above 400 pg/mL will have symptoms. SABRA Amy - Valid encounter within last 6 months    Recent Outpatient Visits           3 months ago Acute midline low back pain without sciatica   [facility] Duanne Butler DASEN, MD   4 months ago Acute midline low back pain without sciatica   [place] Duanne Butler DASEN, MD   9 months ago Type 2 diabetes mellitus without complication, without long-term current use of insulin  [facility])   [facility] Duanne Butler DASEN, MD   1 year ago Fatigue, unspecified type   [facility] Duanne Butler DASEN, MD   2 years ago Cervical radiculopathy   [facility] [place], Butler DASEN, MD              Passed - CBC within normal  limits and completed in the last 12 months    WBC  Date Value Ref Range Status  02/06/2024 5.1 3.8 - 10.8 Thousand/uL Final   RBC  Date Value Ref Range Status  02/06/2024 4.39 4.20 - 5.80 Million/uL Final   Hemoglobin  Date Value Ref Range Status  02/06/2024 13.3 13.2 - 17.1 g/dL Final   HCT  Date Value Ref Range Status  02/06/2024 41.2 38.5 - 50.0 % Final   MCHC  Date Value Ref Range Status  02/06/2024 32.3 32.0 - 36.0 g/dL Final    Comment:    For adults, a slight decrease in the calculated MCHC value (in the range of 30 to 32 g/dL) is most likely not clinically significant; however, it should be interpreted with caution in correlation with other red cell parameters and the patient's clinical condition.    [facility]  Date Value Ref Range Status  02/06/2024 30.3 27.0 - 33.0 pg Final   MCV  Date Value Ref Range Status  02/06/2024 93.8 80.0 - 100.0 fL Final   No results found for: PLTCOUNTKUC,  LABPLAT, POCPLA RDW  Date Value Ref Range Status  02/06/2024 13.8 11.0 - 15.0 % Final

## 2024-05-30 ENCOUNTER — Ambulatory Visit

## 2024-05-30 VITALS — Ht 70.0 in | Wt 167.0 lb

## 2024-05-30 DIAGNOSIS — Z0000 Encounter for general adult medical examination without abnormal findings: Secondary | ICD-10-CM

## 2024-05-30 NOTE — Progress Notes (Signed)
 Chief Complaint  Patient presents with   Medicare Wellness     Subjective:   Todd Lawrence is a 73 y.o. male who presents for a Medicare Annual Wellness Visit.  Allergies (verified) Patient has no known allergies.   History: Past Medical History:  Diagnosis Date   Acute meniscal tear of left knee    DDD (degenerative disc disease), lumbar    has received ESI x 3   Diabetes mellitus without complication (HCC)    GERD (gastroesophageal reflux disease)    History of blood transfusion    History of staph infection    after thumb surgery   Hyperlipidemia    Left rotator cuff tear    Primary localized osteoarthritis of left knee    Past Surgical History:  Procedure Laterality Date   CERVICAL FUSION     COLONOSCOPY     ESOPHAGOGASTRODUODENOSCOPY (EGD) WITH PROPOFOL  N/A 07/24/2021   Procedure: ESOPHAGOGASTRODUODENOSCOPY (EGD) WITH PROPOFOL ;  Surgeon: Rollin Dover, MD;  Location: [facility];  Service: Endoscopy;  Laterality: N/A;   FOOT SURGERY Right    JOINT REPLACEMENT N/A    [employer]reesia 12/02/2019   KNEE ARTHROSCOPY Bilateral    LAMINOTOMY     C7-T1 (2023) right C8 radiculopathy Samule Pool)   SHOULDER ARTHROSCOPY Left    SPINE SURGERY  11/23/13   neck fusion   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   TOTAL KNEE ARTHROPLASTY Right 02/26/2019   Procedure: TOTAL KNEE ARTHROPLASTY;  Surgeon: Jane Charleston, MD;  Location: [facility];  Service: Orthopedics;  Laterality: Right;   UPPER ESOPHAGEAL ENDOSCOPIC ULTRASOUND (EUS) N/A 07/24/2021   Procedure: UPPER ESOPHAGEAL ENDOSCOPIC ULTRASOUND (EUS);  Surgeon: Rollin Dover, MD;  Location: [facility];  Service: Endoscopy;  Laterality: N/A;   Family History  Problem Relation Age of Onset   Heart disease Father    Asthma Sister    Arthritis Sister    Social History   Occupational History   Not on file  Tobacco Use   Smoking status: Never   Smokeless tobacco: Never  Vaping Use   Vaping status: Never Used   Substance and Sexual Activity   Alcohol use: No   Drug use: No   Sexual activity: Yes    Comment: married, works in aeronautical engineer, 3 children who are grown   Tobacco Counseling Counseling given: Not Answered  SDOH Screenings   Food Insecurity: No Food Insecurity (05/30/2024)  Housing: Low Risk  (05/30/2024)  Transportation Needs: No Transportation Needs (05/30/2024)  Utilities: Not At Risk (05/30/2024)  Alcohol Screen: Low Risk  (12/12/2020)  Depression (PHQ2-9): Low Risk  (05/30/2024)  Financial Resource Strain: Low Risk  (12/12/2020)  Physical Activity: Inactive (05/30/2024)  Social Connections: Moderately Integrated (05/30/2024)  Stress: No Stress Concern Present (05/30/2024)  Tobacco Use: Low Risk  (05/30/2024)  Health Literacy: Adequate Health Literacy (05/30/2024)   See flowsheets for full screening details  Depression Screen PHQ 2 & 9 Depression Scale- Over the past 2 weeks, how often have you been bothered by any of the following problems? Little interest or pleasure in doing things: 0 Feeling down, depressed, or hopeless (PHQ Adolescent also includes...irritable): 0 PHQ-2 Total Score: 0     Goals Addressed             This Visit's Progress    Prevent falls   On track      Visit info / Clinical Intake: Medicare Wellness Visit Type:: Subsequent Annual Wellness Visit Persons participating in visit:: patient  Medicare Wellness Visit Mode:: Telephone If telephone:: video declined Because this visit was a virtual/telehealth visit:: vitals recorded from last visit If Telephone or Video please confirm:: I connected with the patient using audio enabled telemedicine application and verified that I am speaking with the correct person using two identifiers; I discussed the limitations of evaluation and management by telemedicine; The patient expressed understanding and agreed to proceed Patient Location:: home Provider Location:: office Information given by::  patient Interpreter Needed?: No Pre-visit prep was completed: yes AWV questionnaire completed by patient prior to visit?: no Living arrangements:: lives with spouse/significant other Patient's Overall Health Status Rating: very good Typical amount of pain: none Does pain affect daily life?: no Are you currently prescribed opioids?: no  Dietary Habits and Nutritional Risks How many meals a day?: 3 Eats fruit and vegetables daily?: yes Most meals are obtained by: having others provide food Diabetic:: (!) yes Any non-healing wounds?: no How often do you check your BS?: as needed Would you like to be referred to a Nutritionist or for Diabetic Management? : no  Functional Status Activities of Daily Living (to include ambulation/medication): Independent Ambulation: Independent Medication Administration: Independent Home Management: Independent Manage your own finances?: yes Primary transportation is: driving Concerns about vision?: no *vision screening is required for WTM* Concerns about hearing?: no  Fall Screening Falls in the past year?: 0 Number of falls in past year: 0 Was there an injury with Fall?: 0 Fall Risk Category Calculator: 0 Patient Fall Risk Level: Low Fall Risk  Fall Risk Patient at Risk for Falls Due to: No Fall Risks Fall risk Follow up: Falls evaluation completed; Education provided; Falls prevention discussed  Home and Transportation Safety: All rugs have non-skid backing?: yes All stairs or steps have railings?: yes Grab bars in the bathtub or shower?: yes Have non-skid surface in bathtub or shower?: yes Good home lighting?: yes Regular seat belt use?: yes Hospital stays in the last year:: no  Cognitive Assessment Difficulty concentrating, remembering, or making decisions? : no Will 6CIT or Mini Cog be Completed: no 6CIT or Mini Cog Declined: patient alert, oriented, able to answer questions appropriately and recall recent events  Advance  Directives (For Healthcare) Does Patient Have a Medical Advance Directive?: No Would patient like information on creating a medical advance directive?: Yes (MAU/Ambulatory/Procedural Areas - Information given)  Reviewed/Updated  Reviewed/Updated: Reviewed All (Medical, Surgical, Family, Medications, Allergies, Care Teams, Patient Goals)        Objective:    Today's Vitals   05/30/24 0812  Weight: 167 lb (75.8 kg)  Height: 5' 10 (1.778 m)   Body mass index is 23.96 kg/m.  Current Medications (verified) Outpatient Encounter Medications as of 05/30/2024  Medication Sig   aspirin  EC 81 MG tablet Take 81 mg by mouth daily. Swallow whole.   atorvastatin  (LIPITOR) 40 MG tablet Take 1 tablet (40 mg total) by mouth daily.   CREON 36000-114000 units CPEP capsule Take 36,000 Units by mouth 3 (three) times daily with meals.   dapagliflozin propanediol (FARXIGA) 10 MG TABS tablet Take 10 mg by mouth daily.   hypromellose (SYSTANE OVERNIGHT THERAPY) 0.3 % GEL ophthalmic ointment Place 1 application into both eyes at bedtime.   ketotifen (ZADITOR) 0.025 % ophthalmic solution Place 1 drop into both eyes 2 (two) times daily.   meloxicam  (MOBIC ) 15 MG tablet Take 1 tablet (15 mg total) by mouth daily.   metFORMIN  (GLUCOPHAGE ) 1000 MG tablet TAKE 1 TABLET (1,000 MG TOTAL) BY MOUTH TWICE  A DAY WITH FOOD   Misc Natural Products (OSTEO BI-FLEX ADV JOINT SHIELD PO) Take 1 tablet by mouth in the morning and at bedtime.   Multiple Vitamin (MULTIVITAMIN WITH MINERALS) TABS tablet Take 1 tablet by mouth daily.   omeprazole  (PRILOSEC) 40 MG capsule Take 1 capsule (40 mg total) by mouth daily.   pioglitazone  (ACTOS ) 30 MG tablet Take 1 tablet (30 mg total) by mouth daily.   Propylene Glycol (SYSTANE BALANCE) 0.6 % SOLN Place 1 drop into both eyes in the morning and at bedtime.   empagliflozin  (JARDIANCE ) 25 MG TABS tablet Take 1 tablet (25 mg total) by mouth daily before breakfast. (Patient not taking:  Reported on 05/30/2024)   No facility-administered encounter medications on file as of 05/30/2024.   Hearing/Vision screen Hearing Screening - Comments:: Patient is able to hear conversational tones without difficulty. No issues reported.   Vision Screening - Comments:: Wears rx glasses - up to date with routine eye exams with [facility]  Immunizations and Health Maintenance Health Maintenance  Topic Date Due   Diabetic kidney evaluation - Urine ACR  11/03/2023   Influenza Vaccine  02/10/2024   COVID-19 Vaccine (3 - 2025-26 season) 03/12/2024   HEMOGLOBIN A1C  08/08/2024   Diabetic kidney evaluation - eGFR measurement  02/05/2025   FOOT EXAM  02/05/2025   OPHTHALMOLOGY EXAM  02/28/2025   Medicare Annual Wellness (AWV)  05/30/2025   DTaP/Tdap/Td (3 - Td or Tdap) 08/08/2025   Colonoscopy  03/27/2029   Pneumococcal Vaccine: 50+ Years  Completed   Hepatitis C Screening  Completed   Zoster Vaccines- Shingrix   Completed   Meningococcal B Vaccine  Aged Out        Assessment/Plan:  This is a routine wellness examination for Todd Lawrence.  Patient Care Team: Duanne Butler DASEN, MD as PCP - General (Family Medicine) [facility] (Optometry) Kristie Lamprey, MD as Consulting Physician (Gastroenterology) Cristy Bonner DASEN, MD as Consulting Physician (Orthopedic Surgery) Louis Shove, MD as Consulting Physician (Neurosurgery)  I have personally reviewed and noted the following in the patient's chart:   Medical and social history Use of alcohol, tobacco or illicit drugs  Current medications and supplements including opioid prescriptions. Functional ability and status Nutritional status Physical activity Advanced directives List of other physicians Hospitalizations, surgeries, and ER visits in previous 12 months Vitals Screenings to include cognitive, depression, and falls Referrals and appointments  No orders of the defined types were placed in  this encounter.  In addition, I have reviewed and discussed with patient certain preventive protocols, quality metrics, and best practice recommendations. A written personalized care plan for preventive services as well as general preventive health recommendations were provided to patient.   Todd Charmaine Browner, LPN   [id number]/[id number]   Return in 1 year (on 05/30/2025).  After Visit Summary: (MyChart) Due to this being a telephonic visit, the after visit summary with patients personalized plan was offered to patient via MyChart   Nurse Notes: No concerns at this time

## 2024-05-30 NOTE — Patient Instructions (Signed)
 [name],  Thank you for taking the time for your Medicare Wellness Visit. I appreciate your continued commitment to your health goals. Please review the care plan we discussed, and feel free to reach out if I can assist you further.  Please note that Annual Wellness Visits do not include a physical exam. Some assessments may be limited, especially if the visit was conducted virtually. If needed, we may recommend an in-person follow-up with your provider.  Ongoing Care Seeing your primary care provider every 3 to 6 months helps us  monitor your health and provide consistent, personalized care.   Referrals If a referral was made during today's visit and you haven't received any updates within two weeks, please contact the referred provider directly to check on the status.  Recommended Screenings:  Health Maintenance  Topic Date Due   Yearly kidney health urinalysis for diabetes  11/03/2023   Flu Shot  02/10/2024   COVID-19 Vaccine (3 - 2025-26 season) 03/12/2024   Hemoglobin A1C  08/08/2024   Yearly kidney function blood test for diabetes  02/05/2025   Complete foot exam   02/05/2025   Eye exam for diabetics  02/28/2025   Medicare Annual Wellness Visit  05/30/2025   DTaP/Tdap/Td vaccine (3 - Td or Tdap) 08/08/2025   Colon Cancer Screening  03/27/2029   Pneumococcal Vaccine for age over 32  Completed   Hepatitis C Screening  Completed   Zoster (Shingles) Vaccine  Completed   Meningitis B Vaccine  Aged Out       05/30/2024    9:20 AM  Advanced Directives  Does Patient Have a Medical Advance Directive? No  Would patient like information on creating a medical advance directive? Yes (MAU/Ambulatory/Procedural Areas - Information given)   Information on Advanced Care Planning can be found at Craig  Secretary of [facility] Advance Health Care Directives Advance Health Care Directives ([email or web address]   Vision: Annual vision screenings are recommended for early detection of glaucoma,  cataracts, and diabetic retinopathy. These exams can also reveal signs of chronic conditions such as diabetes and high blood pressure.  Dental: Annual dental screenings help detect early signs of oral cancer, gum disease, and other conditions linked to overall health, including heart disease and diabetes.  Please see the attached documents for additional preventive care recommendations.

## 2024-08-12 ENCOUNTER — Other Ambulatory Visit: Payer: Self-pay | Admitting: Family Medicine
